# Patient Record
Sex: Female | Born: 1955
Health system: Southern US, Community
[De-identification: ages and names within clinical notes are randomized; demographics above are authoritative.]

## PROBLEM LIST (undated history)

## (undated) DIAGNOSIS — M797 Fibromyalgia: Secondary | ICD-10-CM

## (undated) DIAGNOSIS — R519 Headache, unspecified: Secondary | ICD-10-CM

## (undated) DIAGNOSIS — I2699 Other pulmonary embolism without acute cor pulmonale: Secondary | ICD-10-CM

## (undated) DIAGNOSIS — R198 Other specified symptoms and signs involving the digestive system and abdomen: Secondary | ICD-10-CM

## (undated) DIAGNOSIS — Z9889 Other specified postprocedural states: Secondary | ICD-10-CM

## (undated) DIAGNOSIS — D649 Anemia, unspecified: Secondary | ICD-10-CM

## (undated) DIAGNOSIS — T4145XA Adverse effect of unspecified anesthetic, initial encounter: Secondary | ICD-10-CM

## (undated) DIAGNOSIS — M1990 Unspecified osteoarthritis, unspecified site: Secondary | ICD-10-CM

## (undated) DIAGNOSIS — E7800 Pure hypercholesterolemia, unspecified: Secondary | ICD-10-CM

## (undated) DIAGNOSIS — R51 Headache: Secondary | ICD-10-CM

## (undated) DIAGNOSIS — Z86711 Personal history of pulmonary embolism: Secondary | ICD-10-CM

## (undated) DIAGNOSIS — R112 Nausea with vomiting, unspecified: Secondary | ICD-10-CM

## (undated) DIAGNOSIS — G4730 Sleep apnea, unspecified: Secondary | ICD-10-CM

## (undated) DIAGNOSIS — Z8719 Personal history of other diseases of the digestive system: Secondary | ICD-10-CM

## (undated) DIAGNOSIS — Z8489 Family history of other specified conditions: Secondary | ICD-10-CM

## (undated) DIAGNOSIS — M2560 Stiffness of unspecified joint, not elsewhere classified: Secondary | ICD-10-CM

## (undated) DIAGNOSIS — M94261 Chondromalacia, right knee: Secondary | ICD-10-CM

## (undated) DIAGNOSIS — K219 Gastro-esophageal reflux disease without esophagitis: Secondary | ICD-10-CM

## (undated) DIAGNOSIS — E785 Hyperlipidemia, unspecified: Secondary | ICD-10-CM

## (undated) DIAGNOSIS — R1310 Dysphagia, unspecified: Secondary | ICD-10-CM

## (undated) DIAGNOSIS — Z98811 Dental restoration status: Secondary | ICD-10-CM

## (undated) DIAGNOSIS — T8859XA Other complications of anesthesia, initial encounter: Secondary | ICD-10-CM

## (undated) DIAGNOSIS — E039 Hypothyroidism, unspecified: Secondary | ICD-10-CM

## (undated) DIAGNOSIS — G4733 Obstructive sleep apnea (adult) (pediatric): Secondary | ICD-10-CM

## (undated) DIAGNOSIS — S83249A Other tear of medial meniscus, current injury, unspecified knee, initial encounter: Secondary | ICD-10-CM

## (undated) DIAGNOSIS — Z7901 Long term (current) use of anticoagulants: Secondary | ICD-10-CM

## (undated) HISTORY — PX: TONSILLECTOMY: SUR1361

## (undated) HISTORY — PX: CARPAL TUNNEL RELEASE: SHX101

## (undated) HISTORY — DX: Fibromyalgia: M79.7

## (undated) HISTORY — DX: Personal history of pulmonary embolism: Z86.711

## (undated) HISTORY — PX: TUBAL LIGATION: SHX77

## (undated) HISTORY — PX: EYE MUSCLE SURGERY: SHX370

## (undated) HISTORY — PX: WISDOM TOOTH EXTRACTION: SHX21

## (undated) HISTORY — DX: Hypothyroidism, unspecified: E03.9

---

## 2001-04-27 ENCOUNTER — Ambulatory Visit (HOSPITAL_COMMUNITY): Admission: RE | Admit: 2001-04-27 | Discharge: 2001-04-27 | Payer: Self-pay | Admitting: Family Medicine

## 2001-04-27 ENCOUNTER — Encounter: Payer: Self-pay | Admitting: Family Medicine

## 2001-06-11 ENCOUNTER — Ambulatory Visit (HOSPITAL_BASED_OUTPATIENT_CLINIC_OR_DEPARTMENT_OTHER): Admission: RE | Admit: 2001-06-11 | Discharge: 2001-06-11 | Payer: Self-pay | Admitting: Specialist

## 2001-06-11 HISTORY — PX: SHOULDER ARTHROSCOPY: SHX128

## 2001-07-02 ENCOUNTER — Encounter (HOSPITAL_COMMUNITY): Admission: RE | Admit: 2001-07-02 | Discharge: 2001-08-01 | Payer: Self-pay | Admitting: Specialist

## 2001-08-06 ENCOUNTER — Encounter (HOSPITAL_COMMUNITY): Admission: RE | Admit: 2001-08-06 | Discharge: 2001-09-05 | Payer: Self-pay | Admitting: Specialist

## 2002-06-02 ENCOUNTER — Emergency Department (HOSPITAL_COMMUNITY): Admission: EM | Admit: 2002-06-02 | Discharge: 2002-06-02 | Payer: Self-pay | Admitting: Internal Medicine

## 2002-06-02 ENCOUNTER — Encounter: Payer: Self-pay | Admitting: Internal Medicine

## 2002-06-05 ENCOUNTER — Ambulatory Visit (HOSPITAL_COMMUNITY): Admission: RE | Admit: 2002-06-05 | Discharge: 2002-06-05 | Payer: Self-pay | Admitting: Family Medicine

## 2002-06-05 ENCOUNTER — Encounter: Payer: Self-pay | Admitting: Family Medicine

## 2002-07-18 ENCOUNTER — Observation Stay (HOSPITAL_COMMUNITY): Admission: RE | Admit: 2002-07-18 | Discharge: 2002-07-19 | Payer: Self-pay | Admitting: Neurosurgery

## 2002-07-18 ENCOUNTER — Encounter: Payer: Self-pay | Admitting: Neurosurgery

## 2002-07-18 HISTORY — PX: ANTERIOR CERVICAL DECOMP/DISCECTOMY FUSION: SHX1161

## 2002-08-01 HISTORY — PX: CARPAL TUNNEL RELEASE: SHX101

## 2003-08-15 ENCOUNTER — Ambulatory Visit (HOSPITAL_COMMUNITY): Admission: RE | Admit: 2003-08-15 | Discharge: 2003-08-15 | Payer: Self-pay | Admitting: Neurosurgery

## 2003-10-01 ENCOUNTER — Encounter (HOSPITAL_COMMUNITY): Admission: RE | Admit: 2003-10-01 | Discharge: 2003-10-02 | Payer: Self-pay | Admitting: Endocrinology

## 2004-11-02 ENCOUNTER — Ambulatory Visit (HOSPITAL_COMMUNITY): Admission: RE | Admit: 2004-11-02 | Discharge: 2004-11-02 | Payer: Self-pay | Admitting: Family Medicine

## 2004-11-17 ENCOUNTER — Encounter: Admission: RE | Admit: 2004-11-17 | Discharge: 2004-11-17 | Payer: Self-pay | Admitting: Family Medicine

## 2005-07-14 ENCOUNTER — Other Ambulatory Visit: Admission: RE | Admit: 2005-07-14 | Discharge: 2005-07-14 | Payer: Self-pay | Admitting: Dermatology

## 2005-11-03 ENCOUNTER — Encounter: Admission: RE | Admit: 2005-11-03 | Discharge: 2005-11-03 | Payer: Self-pay | Admitting: Specialist

## 2006-05-12 ENCOUNTER — Encounter: Admission: RE | Admit: 2006-05-12 | Discharge: 2006-05-12 | Payer: Self-pay | Admitting: Family Medicine

## 2007-05-15 ENCOUNTER — Encounter: Admission: RE | Admit: 2007-05-15 | Discharge: 2007-05-15 | Payer: Self-pay | Admitting: Family Medicine

## 2008-10-24 ENCOUNTER — Ambulatory Visit (HOSPITAL_COMMUNITY): Admission: RE | Admit: 2008-10-24 | Discharge: 2008-10-24 | Payer: Self-pay | Admitting: Family Medicine

## 2009-10-26 ENCOUNTER — Ambulatory Visit (HOSPITAL_COMMUNITY): Admission: RE | Admit: 2009-10-26 | Discharge: 2009-10-26 | Payer: Self-pay | Admitting: Family Medicine

## 2010-08-21 ENCOUNTER — Encounter: Payer: Self-pay | Admitting: Neurosurgery

## 2010-12-17 NOTE — Op Note (Signed)
[place]  Patient:    Wendy Marshall, Wendy Marshall Visit Number: [id number] MRN: [id number]          Service Type: DSU Location: [facility] Attending Physician:  Pierce Crane Dictated by:   Javier Docker, M.D. Proc. Date: 06/11/01 Admit Date:  06/11/2001                             Operative Report  PREOPERATIVE DIAGNOSIS:  Impingement syndrome, left shoulder.  POSTOPERATIVE DIAGNOSES: 1. Impingement syndrome, left shoulder. 2. Partial tear, rotator cuff, bursal side. 3. Labral tear. 4. Glenohumeral arthrosis.  PROCEDURES: 1. Left shoulder arthroscopy. 2. Debridement of labral tear. 3. Subacromial decompression. 4. Acromioplasty. 5. Bursectomy. 6. Debridement of minor bursal side of rotator cuff tear.  BRIEF HISTORY AND INDICATION:  This is a 55 year old female with refractory shoulder pain and MRI indicating no tear but tendinosis.  Had a positive impingement test, had a positive impingement sign, nontender over the [facility].  Due to persistent symptoms, operative intervention was indicated for decompression of the subacromial joint by acromioplasty, bursectomy, review of the rotator cuff; if torn significantly, open rotator cuff was so indicated.  Risks and benefits were discussed, including bleeding infection, damage to neurovascular structures, need for repeat procedure, tear in the future, adhesive capsulitis, etc.  DESCRIPTION OF PROCEDURE:  Patient supine in beach chair position after the induction of adequate general anesthesia, 400 mg IV Cipro, left shoulder and upper extremity was prepped and draped in the usual sterile fashion.  A surgical marker was used to delineate the acromion and AC joint and the coracoid.  Standard anterior lateral and posterior lateral portals were utilized with incision through the skin only.  Traction applied to the arm and a cannula was introduced to the glenohumeral joint, fenestrating  it atraumatically.  Inspection of the glenohumeral joint revealed some degenerative changes of the glenoid side.  We did not see any rotator cuff tear from beneath this.  There was an anterior labral tear.  I therefore introduced the cannula in the anterior portal, bisecting the coracoid and the anterolateral aspect of the acromion through the skin only beneath the biceps tendon.  I probed this.  It was not completely detached, partially. Therefore, I entered a shaver and debrided the anterior portion of the labrum. The remainder was stable to probe palpation.  There was no instability noted intraoperatively with manipulation.  The biceps tendon was unremarkable, as was the subscap.  The glenoid was unremarkable as well.  This was then copiously irrigated and the cannula was reintroduced in the subacromial space using the anterior lateral portal.  Significant hypertrophic bursa was noted, and a full bursectomy was performed utilizing the combination of the shaver and the Arthrocare wand.  Periosteum was removed from the undersurface of the acromion, and there was an anterior inferior acromial osteophyte in deep.  The anterolateral corner and the anteromedial corner were delineated with a needle.  After full bursectomy was performed, inspection of the rotator cuff revealed a minor bursal side tearing, which was minorly debrided.  This was good bleeding tissue, however, and the most significant finding here was the subacromial bursa, which was hypertrophic, and the small anterior acromial spur.  The high-speed bur was utilized to perform an acromioplasty to the anterior aspect of the acromion.  Significant room was noted after this.  The CA ligament was detached and removed as well, utilizing electrocautery.  All bleeding was  electrocauterized.  The subacromial space was copiously irrigated with a good range of motion, and there was no impingement.  Again full inspection of the rotator cuff  demonstrated no evidence of full-thickness tear, and therefore no open repair was necessary.  After repair, the portals were closed with 4-0 nylon simple sutures.  Marcaine 0.25% with epinephrine was infiltrated just prior to the procedure.  The wound was dressed sterilely. The patient was placed in a sling, extubated without difficulty, and transported to the recovery room in satisfactory condition.  The patient tolerated the procedure well. Dictated by:   Javier Docker, M.D. Attending Physician:  Pierce Crane DD:  06/11/01 TD:  06/12/01 Job: [id number]

## 2010-12-17 NOTE — H&P (Signed)
[facility]  Patient:    Wendy Marshall, Wendy Marshall Visit Number: [id number] MRN: [id number]          Service Type: Attending:  Javier Docker, M.D. Dictated by:   Ralene Bathe, P.A.                           History and Physical  DATE OF BIRTH:  [birth year]  CHIEF COMPLAINT:  Left shoulder pain.  HISTORY OF PRESENT ILLNESS:  This is a 55 year old female who has had ongoing left shoulder pain for approximately one year.  She has failed conservative measures and had an MRI which has shown significant impingement.  She has had injections which helped only a minimal amount of times.  She has demonstrated weakness as well as positive secondary impingement signs.  Patient is noted to have a type 2 acromion without significant evidence of rotator cuff tear.  At this time, it is felt best that she be treated with surgical decompression with a left shoulder arthroscopy, subacromial decompression and possible rotator cuff repair if indeed there is one that has been underestimated by MRI.  Risks and benefits were discussed with patient and she is in agreement and wishes to proceed at this point.  PAST MEDICAL HISTORY: 1. Hypothyroidism. 2. Difficulty with anesthesia on prior two occasions. 3. Reflux, treated with over-the-counter methods.  ALLERGIES: 1. PENICILLIN -- hives and shortness of breath. 2. FLEXERIL and SOMA cause decreased blood pressure and heart rate.  PAST SURGICAL HISTORY: 1. Eye surgery. 2. BTL. 3. Tonsillectomy and adenoidectomy.  MEDICATIONS:  Synthroid 0.05 mg q.d.  SOCIAL HISTORY:  She is married.  She is left-hand dominant.  She has stopped smoking now for three years and does not drink.  REVIEW OF SYSTEMS:  GENERAL:  Patient denies any recent fevers, chills or night sweats.  No bleeding tendencies.  Patient has had difficulty with anesthesia on two occasions.  CNS:  No blurred or double vision, seizures, headaches or paralysis.   RESPIRATORY:  No shortness of breath, productive cough or hemoptysis.  CARDIOVASCULAR:  No chest pain, angina or orthopnea. GI:  No nausea, vomiting, constipation, melena or bloody stools. GENITOURINARY:  No dysuria or hematuria.  MUSCULOSKELETAL:  As pertinent in present illness.  PHYSICAL EXAMINATION:  GENERAL:  Well-developed, well-nourished 55 year old female.  She is alert and oriented.  VITAL SIGNS:  Pulse 72.  Respirations are 12.  Blood pressure is 122/84.  NECK:  Supple.  No lymphadenopathy.  No carotid bruits appreciated on exam.  CHEST:  Clear to auscultation bilaterally.  No rales or rhonchi.  HEART:  Regular rate and rhythm.  No murmurs, gallops, rubs, heaves or thrills.  ABDOMEN:  Positive bowel sounds.  Soft, nontender.  EXTREMITIES:  Neurovascularly, she is intact to bilateral upper and lower extremities.  No pitting edema.  Left upper extremity shoulder exam is as in history of present illness.  IMPRESSION: 1. Left shoulder impingement syndrome. 2. Hypothyroidism.  PLAN:  Patient is being admitted for left shoulder arthroscopy, subacromial decompression and possible rotator cuff repair if indicated. Dictated by:   Ralene Bathe, P.A. Attending:  Javier Docker, M.D. DD:  05/31/01 TD:  06/01/01 Job: [id number]

## 2010-12-17 NOTE — Op Note (Signed)
NAME:  Wendy Marshall, Wendy Marshall                       ACCOUNT NO.:  [id number]   MEDICAL RECORD NO.:  [id number]                   PATIENT TYPE:  INP   LOCATION:  3007                                 FACILITY:  [facility]   PHYSICIAN:  Danae Orleans. Venetia Maxon, M.D.               DATE OF BIRTH:  1956-01-22   DATE OF PROCEDURE:  07/18/2002  DATE OF DISCHARGE:  07/19/2002                                 OPERATIVE REPORT   PREOPERATIVE DIAGNOSES:  Herniated cervical disk with cervical spondylosis,  cervical degenerative disk disease, and cervical radiculopathy at  fifth/sixth cervical.   POSTOPERATIVE DIAGNOSES:  Herniated cervical disk with cervical spondylosis,  cervical degenerative disk disease, and cervical radiculopathy at  fifth/sixth cervical.   PROCEDURE:  Anterior cervical diskectomy and decompression and fusion, C5-6  level, with allograft bone graft and anterior cervical plate.   SURGEON:  Danae Orleans. Venetia Maxon, M.D.   ASSISTANT:  Clydene Fake, M.D.   ANESTHESIA:  General endotracheal anesthesia.   ESTIMATED BLOOD LOSS:  Minimal.   COMPLICATIONS:  None.   DISPOSITION:  To recovery.   INDICATIONS:  Wendy Marshall is a 55 year old woman with cervical  spondylosis and a cervical disk herniation at the C5-6 level with  biforaminal stenosis and bilateral upper extremity numbness and weakness.  It was elected to take her to surgery for anterior cervical diskectomy and  fusion.   PROCEDURE:  Ms. Cordone was brought to the operating room.  Following  satisfactory and uncomplicated induction of general endotracheal anesthesia  and placement of intravenous lines, the patient was placed in the supine  position on the operating table.  Her neck was placed in slight extension.  She was placed in 10 pounds of pulled traction.  Her anterior neck was then  prepped and draped in the usual sterile fashion.  The planned incision was  infiltrated with 0.25% Marcaine and 0.5% lidocaine with  1:200,000  epinephrine.  The incision was made at the midline to the anterior border of  the sternocleidomastoid muscle on the left side of midline.  This was  carried sharply through the platysmal layer.  Subplatysmal dissection was  performed, and then blunt dissection was performed exposing the anterior  cervical spine, keeping the carotid sheath lateral, trachea and esophagus  medial.  The interspace which was felt to be the C5-6 level was identified,  and a spinal needle was placed at this level.  Intraoperative x-ray  confirmed this to be the C5-6 level.  Subsequently, the longus colli muscles  were taken down from the anterior cervical spine, C5 through C6 levels  bilaterally using electrocautery and a Key elevator.  A Shadowline self-  retaining retractor was placed to facilitate exposure.  The interspace of C5-  6 was then incised with a 15 blade, and disk material was removed in  piecemeal fashion using a variety of Carlens curettes.  The endplates were  stripped of residual disk material.  Disk space spreader was placed.  The  microscope was brought into the field.  Using high magnification, the  endplates of C5 and C6 were decorticated with a high-speed drill and  uncinate spurs were drilled down.  Subsequently, the central spinal cord  dura was decompressed, removing central-to-left-sided disk herniation and  then decompressing both neural foramina.  The C6 nerve roots were well  decompressed bilaterally.  A nerve hook was used along the course of the C6  nerve roots as they exited the neural foramina.  Hemostasis was assured with  Gelfoam soaked in thrombin.  An 8-mm pre-shaped patellar bone allograft  wedge was then inserted in the interspace and countersunk appropriately.  The traction weight was removed.  A 24-mm Trinica anterior cervical plate  was then affixed to the anterior cervical spine using 14-mm variable-angle  screws, two at C5, two at C6 level.  All screws had  excellent purchase.  Locking mechanisms were engaged.  Final x-ray confirmed position of the bone  graft and the anterior cervical plate.  The wound was then copiously  irrigated with bacitracin and saline.  Subcutaneous tissues were inspected  and found to be in good repair.  Hemostasis was assured with bipolar cautery  and Gelfoam soaked in thrombin.  The wound was then closed with interrupted  3-0 Vicryl at the platysmal layer and a 4-0 running Vicryl subcuticular  stitch.  The wound was dressed with Dermabond.  The patient was extubated in  the operating room and taken to the recovery room in stable satisfactory  condition, having tolerated the operation well.  All counts were correct at  the end of the case.                                               Danae Orleans. Venetia Maxon, M.D.    JDS/MEDQ  D:  07/18/2002  T:  07/19/2002  Job:  [id number]

## 2011-01-17 ENCOUNTER — Other Ambulatory Visit: Payer: Self-pay | Admitting: Family Medicine

## 2011-01-17 DIAGNOSIS — Z139 Encounter for screening, unspecified: Secondary | ICD-10-CM

## 2011-01-24 ENCOUNTER — Ambulatory Visit (HOSPITAL_COMMUNITY)
Admission: RE | Admit: 2011-01-24 | Discharge: 2011-01-24 | Disposition: A | Discharge status: Home / Self Care | Payer: 59 | Source: Ambulatory Visit | Attending: Family Medicine | Admitting: Family Medicine

## 2011-01-24 DIAGNOSIS — Z1231 Encounter for screening mammogram for malignant neoplasm of breast: Secondary | ICD-10-CM | POA: Insufficient documentation

## 2011-01-24 DIAGNOSIS — Z139 Encounter for screening, unspecified: Secondary | ICD-10-CM

## 2012-05-28 ENCOUNTER — Other Ambulatory Visit: Payer: Self-pay | Admitting: Family Medicine

## 2012-05-28 DIAGNOSIS — Z139 Encounter for screening, unspecified: Secondary | ICD-10-CM

## 2012-06-04 ENCOUNTER — Ambulatory Visit (HOSPITAL_COMMUNITY)
Admission: RE | Admit: 2012-06-04 | Discharge: 2012-06-04 | Disposition: A | Discharge status: Home / Self Care | Payer: BC Managed Care – PPO | Source: Ambulatory Visit | Attending: Family Medicine | Admitting: Family Medicine

## 2012-06-04 DIAGNOSIS — Z1231 Encounter for screening mammogram for malignant neoplasm of breast: Secondary | ICD-10-CM | POA: Insufficient documentation

## 2012-06-04 DIAGNOSIS — Z139 Encounter for screening, unspecified: Secondary | ICD-10-CM

## 2013-08-02 ENCOUNTER — Ambulatory Visit (INDEPENDENT_AMBULATORY_CARE_PROVIDER_SITE_OTHER): Payer: 59 | Admitting: Nurse Practitioner

## 2013-08-02 ENCOUNTER — Encounter: Payer: Self-pay | Admitting: Nurse Practitioner

## 2013-08-02 VITALS — BP 134/96 | Temp 98.3°F | Ht 64.0 in | Wt 198.3 lb

## 2013-08-02 DIAGNOSIS — N949 Unspecified condition associated with female genital organs and menstrual cycle: Secondary | ICD-10-CM

## 2013-08-02 DIAGNOSIS — R102 Pelvic and perineal pain: Secondary | ICD-10-CM

## 2013-08-02 DIAGNOSIS — R109 Unspecified abdominal pain: Secondary | ICD-10-CM

## 2013-08-02 LAB — POCT URINALYSIS DIPSTICK
Spec Grav, UA: 1.02
pH, UA: 5

## 2013-08-05 ENCOUNTER — Encounter: Payer: Self-pay | Admitting: Nurse Practitioner

## 2013-08-05 DIAGNOSIS — M797 Fibromyalgia: Secondary | ICD-10-CM | POA: Insufficient documentation

## 2013-08-05 DIAGNOSIS — E039 Hypothyroidism, unspecified: Secondary | ICD-10-CM | POA: Insufficient documentation

## 2013-08-05 NOTE — Progress Notes (Signed)
Subjective:  Presents complaints of lower abdominal pain for the past week. No nausea vomiting. No fever. No upper abdominal pain. Postmenopausal. No vaginal bleeding for the past 2-3 years. Pain is towards the left lower abdominal area. Describes as a burning/stinging sensation. Pain is fairly constant worse with sitting bending or laying on her left side. No urinary symptoms. No diarrhea constipation or blood in her stool. No vaginal discharge. Married, same sexual partner.  Objective:   BP 134/96  Temp(Src) 98.3 F (36.8 C)  Ht 5\' 4"  (1.626 m)  Wt 198 lb 4.8 oz (89.948 kg)  BMI 34.02 kg/m2 NAD. Alert, oriented. Lungs clear. No CVA area tenderness. Heart regular rate rhythm. Abdomen soft nondistended with left pelvic/left lower Carter and tenderness on exam. External GU normal. Vagina moist, no discharge noted. Cervix normal limit in appearance. No CMT. Right adnexal area and midline nontender. Tenderness towards the left adnexal area, no obvious masses. Tenderness appears to improve slightly towards the left lower abdominal area. No rebound or guarding. UA negative. Urine pregnancy test negative.  Assessment:Abdominal pain, unspecified site - Plan: POCT urinalysis dipstick, POCT Pregnancy, Urine, POCT Pregnancy, Urine  Pelvic pain in female - Plan: US Pelvis Complete  Plan: Warning signs reviewed at length including worsening pain and fever. Patient to go ED over the weekend if her symptoms worsen. Otherwise pelvic ultrasound as planned. Further followup based on ultrasound report.

## 2013-08-06 ENCOUNTER — Ambulatory Visit (HOSPITAL_COMMUNITY): Payer: BC Managed Care – PPO

## 2013-10-23 ENCOUNTER — Other Ambulatory Visit: Payer: Self-pay | Admitting: Nurse Practitioner

## 2013-12-17 ENCOUNTER — Ambulatory Visit (INDEPENDENT_AMBULATORY_CARE_PROVIDER_SITE_OTHER): Payer: 59 | Admitting: Family Medicine

## 2013-12-17 ENCOUNTER — Encounter: Payer: Self-pay | Admitting: Family Medicine

## 2013-12-17 VITALS — BP 128/76 | Temp 98.4°F | Ht 64.0 in | Wt 201.0 lb

## 2013-12-17 DIAGNOSIS — Z131 Encounter for screening for diabetes mellitus: Secondary | ICD-10-CM

## 2013-12-17 DIAGNOSIS — E039 Hypothyroidism, unspecified: Secondary | ICD-10-CM

## 2013-12-17 DIAGNOSIS — Z13220 Encounter for screening for lipoid disorders: Secondary | ICD-10-CM

## 2013-12-17 DIAGNOSIS — J329 Chronic sinusitis, unspecified: Secondary | ICD-10-CM

## 2013-12-17 MED ORDER — AZITHROMYCIN 250 MG PO TABS: ORAL_TABLET | ORAL | Status: DC

## 2013-12-17 NOTE — Progress Notes (Addendum)
   Subjective:    Patient ID: Wendy Marshall, female    DOB: 23-Apr-1956, 58 y.o.   MRN: [id number]  Sore Throat  This is a new problem. The current episode started today. Neither side of throat is experiencing more pain than the other. There has been no fever. Associated symptoms include ear pain. She has tried NSAIDs for the symptoms. The treatment provided mild relief.   Results for orders placed in visit on 08/02/13  POCT URINALYSIS DIPSTICK      Result Value Ref Range   Color, UA       Clarity, UA       Glucose, UA       Bilirubin, UA       Ketones, UA       Spec Grav, UA 1.020     Blood, UA       pH, UA 5.0     Protein, UA       Urobilinogen, UA       Nitrite, UA       Leukocytes, UA        Pos strep exposure  No fever  99 this morn  bilat ear pain  Energy level doable,  Review of Systems  HENT: Positive for ear pain.    Diarrhea no rash ROS otherwise negative    Objective:   Physical Exam  Alert moderate malaise. H&T frontal mass or tenderness. Pharynx erythematous neck supple. Lungs clear heart regular in rhythm.      Assessment & Plan:  Impression 1 rhinosinusitis #2 hypothyroidism status uncertain. Plan antibiotics prescribed. Symptomatic care discussed. Fasting blood work ordered. WSL

## 2013-12-18 ENCOUNTER — Encounter: Payer: Self-pay | Admitting: Family Medicine

## 2013-12-28 LAB — LIPID PANEL
Cholesterol: 215 mg/dL — ABNORMAL HIGH (ref 0–200)
HDL: 50 mg/dL (ref >39)
LDL Cholesterol: 138 mg/dL — ABNORMAL HIGH (ref 0–99)
Total CHOL/HDL Ratio: 4.3 Ratio
Triglycerides: 135 mg/dL (ref <150)
VLDL: 27 mg/dL (ref 0–40)

## 2013-12-28 LAB — TSH: TSH: 1.969 u[IU]/mL (ref 0.350–4.500)

## 2013-12-28 LAB — GLUCOSE, RANDOM: Glucose, Bld: 86 mg/dL (ref 70–99)

## 2013-12-29 ENCOUNTER — Encounter: Payer: Self-pay | Admitting: Family Medicine

## 2014-01-09 ENCOUNTER — Other Ambulatory Visit: Payer: Self-pay | Admitting: Family Medicine

## 2014-07-02 ENCOUNTER — Other Ambulatory Visit: Payer: Self-pay | Admitting: Family Medicine

## 2014-10-07 ENCOUNTER — Encounter: Payer: Self-pay | Admitting: Family Medicine

## 2014-11-24 ENCOUNTER — Other Ambulatory Visit: Payer: Self-pay | Admitting: Family Medicine

## 2015-02-17 ENCOUNTER — Telehealth: Payer: Self-pay | Admitting: Family Medicine

## 2015-02-17 DIAGNOSIS — Z79899 Other long term (current) drug therapy: Secondary | ICD-10-CM

## 2015-02-17 DIAGNOSIS — E039 Hypothyroidism, unspecified: Secondary | ICD-10-CM

## 2015-02-17 DIAGNOSIS — Z13220 Encounter for screening for lipoid disorders: Secondary | ICD-10-CM

## 2015-02-17 NOTE — Telephone Encounter (Signed)
Calling to request order for BW.

## 2015-02-17 NOTE — Telephone Encounter (Signed)
Lipid, metabolic 7, TSH-it would be wise for patient to also schedule follow-up visit at this time in order to see [place] after these labs

## 2015-02-18 NOTE — Telephone Encounter (Signed)
Blood work orders placed in [employer]. Patient notified.

## 2015-02-24 LAB — LIPID PANEL
Chol/HDL Ratio: 3.9 ratio units (ref 0.0–4.4)
Cholesterol, Total: 232 mg/dL — ABNORMAL HIGH (ref 100–199)
HDL: 60 mg/dL (ref >39)
LDL Calculated: 145 mg/dL — ABNORMAL HIGH (ref 0–99)
Triglycerides: 137 mg/dL (ref 0–149)
VLDL Cholesterol Cal: 27 mg/dL (ref 5–40)

## 2015-02-24 LAB — BASIC METABOLIC PANEL
BUN/Creatinine Ratio: 13 (ref 9–23)
BUN: 11 mg/dL (ref 6–24)
CO2: 24 mmol/L (ref 18–29)
Calcium: 9.2 mg/dL (ref 8.7–10.2)
Chloride: 101 mmol/L (ref 97–108)
Creatinine, Ser: 0.83 mg/dL (ref 0.57–1.00)
GFR calc Af Amer: 91 mL/min/{1.73_m2} (ref >59)
GFR calc non Af Amer: 79 mL/min/{1.73_m2} (ref >59)
Glucose: 90 mg/dL (ref 65–99)
Potassium: 4.4 mmol/L (ref 3.5–5.2)
Sodium: 140 mmol/L (ref 134–144)

## 2015-02-24 LAB — TSH: TSH: 1.03 u[IU]/mL (ref 0.450–4.500)

## 2015-03-02 ENCOUNTER — Encounter: Payer: Self-pay | Admitting: Nurse Practitioner

## 2015-03-02 ENCOUNTER — Ambulatory Visit (INDEPENDENT_AMBULATORY_CARE_PROVIDER_SITE_OTHER): Payer: BLUE CROSS/BLUE SHIELD | Admitting: Nurse Practitioner

## 2015-03-02 VITALS — BP 106/76 | Ht 64.0 in | Wt 200.5 lb

## 2015-03-02 DIAGNOSIS — E039 Hypothyroidism, unspecified: Secondary | ICD-10-CM | POA: Diagnosis not present

## 2015-03-02 MED ORDER — LEVOTHYROXINE SODIUM 100 MCG PO TABS: 100.0000 ug | ORAL_TABLET | Freq: Every day | ORAL | Status: DC

## 2015-03-03 ENCOUNTER — Encounter: Payer: Self-pay | Admitting: Nurse Practitioner

## 2015-03-03 NOTE — Progress Notes (Signed)
Subjective:  Presents for recheck on her thyroid. Did not have any insurance due to loss of her job for several months. Now back to work and has insurance. Her mother had some leftover 100 g levothyroxine after she passed away, patient has been taking that for 3 months. Energy level much improved. Not as cold natured. States her eyebrows have grown back. No tremors or palpitations. No insomnia. Continues to eat healthy diet with regular activity. Still having difficulty losing weight. No difficulty swallowing.  Objective:   BP 106/76 mmHg  Ht 5\' 4"  (1.626 m)  Wt 200 lb 8 oz (90.946 kg)  BMI 34.40 kg/m2 NAD. Alert, oriented. Lungs clear. Heart regular rate rhythm. No tachycardia. No tremors. No lid lag. Thyroid nontender, no masses or goiter noted. TSH on 7/25 was 1.03 while patient was on for 100 g dose.  Assessment:  Problem List Items Addressed This Visit      Endocrine   Hypothyroidism - Primary (Chronic)   Relevant Medications   levothyroxine (SYNTHROID, LEVOTHROID) 100 MCG tablet     Plan:  Meds ordered this encounter  Medications  . levothyroxine (SYNTHROID, LEVOTHROID) 100 MCG tablet    Sig: Take 1 tablet (100 mcg total) by mouth daily.    Dispense:  90 tablet    Refill:  3    Order Specific Question:  Supervising Provider    Answer:  Mikey Kirschner [[id number]]   Continue 100 g. Discussed importance of regular activity and weight loss. Reminded patient about preventive health physical. Return in about 1 year (around 03/01/2016).

## 2015-04-30 ENCOUNTER — Emergency Department (HOSPITAL_COMMUNITY)
Admission: EM | Admit: 2015-04-30 | Discharge: 2015-05-01 | Disposition: A | Discharge status: Home / Self Care | Payer: BLUE CROSS/BLUE SHIELD | Attending: Emergency Medicine | Admitting: Emergency Medicine

## 2015-04-30 ENCOUNTER — Encounter (HOSPITAL_COMMUNITY): Payer: Self-pay | Admitting: *Deleted

## 2015-04-30 ENCOUNTER — Emergency Department (HOSPITAL_COMMUNITY): Payer: BLUE CROSS/BLUE SHIELD

## 2015-04-30 DIAGNOSIS — K047 Periapical abscess without sinus: Secondary | ICD-10-CM | POA: Insufficient documentation

## 2015-04-30 DIAGNOSIS — E039 Hypothyroidism, unspecified: Secondary | ICD-10-CM | POA: Insufficient documentation

## 2015-04-30 DIAGNOSIS — M797 Fibromyalgia: Secondary | ICD-10-CM | POA: Diagnosis not present

## 2015-04-30 DIAGNOSIS — Z79899 Other long term (current) drug therapy: Secondary | ICD-10-CM | POA: Diagnosis not present

## 2015-04-30 DIAGNOSIS — R51 Headache: Secondary | ICD-10-CM | POA: Insufficient documentation

## 2015-04-30 DIAGNOSIS — Z880 Allergy status to penicillin: Secondary | ICD-10-CM | POA: Insufficient documentation

## 2015-04-30 DIAGNOSIS — K920 Hematemesis: Secondary | ICD-10-CM | POA: Diagnosis not present

## 2015-04-30 DIAGNOSIS — R509 Fever, unspecified: Secondary | ICD-10-CM | POA: Insufficient documentation

## 2015-04-30 DIAGNOSIS — Z792 Long term (current) use of antibiotics: Secondary | ICD-10-CM | POA: Diagnosis not present

## 2015-04-30 DIAGNOSIS — R519 Headache, unspecified: Secondary | ICD-10-CM

## 2015-04-30 LAB — CBC WITH DIFFERENTIAL/PLATELET
Basophils Absolute: 0 10*3/uL (ref 0.0–0.1)
Basophils Relative: 0 %
Eosinophils Absolute: 0 10*3/uL (ref 0.0–0.7)
Eosinophils Relative: 0 %
HCT: 43.3 % (ref 36.0–46.0)
Hemoglobin: 14.4 g/dL (ref 12.0–15.0)
Lymphocytes Relative: 22 %
Lymphs Abs: 1.8 10*3/uL (ref 0.7–4.0)
MCH: 27.1 pg (ref 26.0–34.0)
MCHC: 33.3 g/dL (ref 30.0–36.0)
MCV: 81.4 fL (ref 78.0–100.0)
Monocytes Absolute: 0.5 10*3/uL (ref 0.1–1.0)
Monocytes Relative: 6 %
Neutro Abs: 6 10*3/uL (ref 1.7–7.7)
Neutrophils Relative %: 72 %
Platelets: 243 10*3/uL (ref 150–400)
RBC: 5.32 MIL/uL — ABNORMAL HIGH (ref 3.87–5.11)
RDW: 13 % (ref 11.5–15.5)
WBC: 8.4 10*3/uL (ref 4.0–10.5)

## 2015-04-30 LAB — BASIC METABOLIC PANEL
Anion gap: 5 (ref 5–15)
BUN: 11 mg/dL (ref 6–20)
CO2: 27 mmol/L (ref 22–32)
Calcium: 9.1 mg/dL (ref 8.9–10.3)
Chloride: 102 mmol/L (ref 101–111)
Creatinine, Ser: 0.74 mg/dL (ref 0.44–1.00)
GFR calc Af Amer: >60 mL/min (ref >60)
GFR calc non Af Amer: >60 mL/min (ref >60)
Glucose, Bld: 112 mg/dL — ABNORMAL HIGH (ref 65–99)
Potassium: 3.7 mmol/L (ref 3.5–5.1)
Sodium: 134 mmol/L — ABNORMAL LOW (ref 135–145)

## 2015-04-30 MED ORDER — IOHEXOL 300 MG/ML  SOLN
75.0000 mL | Freq: Once | INTRAMUSCULAR | Status: AC | PRN
  Administered 2015-04-30: 75 mL via INTRAVENOUS

## 2015-04-30 MED ORDER — DIPHENHYDRAMINE HCL 50 MG/ML IJ SOLN
25.0000 mg | Freq: Once | INTRAMUSCULAR | Status: AC
  Administered 2015-04-30: 25 mg via INTRAVENOUS
  Filled 2015-04-30: qty 1

## 2015-04-30 MED ORDER — PROCHLORPERAZINE EDISYLATE 5 MG/ML IJ SOLN
10.0000 mg | Freq: Once | INTRAMUSCULAR | Status: AC
  Administered 2015-04-30: 10 mg via INTRAVENOUS
  Filled 2015-04-30: qty 2
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
## 2015-04-30 NOTE — ED Notes (Signed)
Pt reporting having a root canal done yesterday, since that time has had headache, nausea, vomiting and neck pain.  Unable to reach dentist today.

## 2015-05-01 MED ORDER — KETOROLAC TROMETHAMINE 30 MG/ML IJ SOLN
30.0000 mg | Freq: Once | INTRAMUSCULAR | Status: AC
  Administered 2015-05-01: 30 mg via INTRAVENOUS
  Filled 2015-05-01: qty 1

## 2015-05-01 MED ORDER — MAGNESIUM SULFATE 2 GM/50ML IV SOLN
2.0000 g | Freq: Once | INTRAVENOUS | Status: AC
  Administered 2015-05-01: 2 g via INTRAVENOUS
  Filled 2015-05-01: qty 50

## 2015-05-01 MED ORDER — PROMETHAZINE HCL 25 MG PO TABS: 25.0000 mg | ORAL_TABLET | Freq: Four times a day (QID) | ORAL | Status: DC | PRN

## 2015-05-01 MED ORDER — DIPHENHYDRAMINE HCL 50 MG/ML IJ SOLN
25.0000 mg | Freq: Once | INTRAMUSCULAR | Status: AC
  Administered 2015-05-01: 25 mg via INTRAVENOUS
  Filled 2015-05-01: qty 1

## 2015-05-01 MED ORDER — PROCHLORPERAZINE EDISYLATE 5 MG/ML IJ SOLN
10.0000 mg | Freq: Once | INTRAMUSCULAR | Status: AC
  Administered 2015-05-01: 10 mg via INTRAVENOUS
  Filled 2015-05-01: qty 2

## 2015-08-06 ENCOUNTER — Ambulatory Visit (INDEPENDENT_AMBULATORY_CARE_PROVIDER_SITE_OTHER): Payer: BLUE CROSS/BLUE SHIELD | Admitting: Family Medicine

## 2015-08-06 ENCOUNTER — Encounter: Payer: Self-pay | Admitting: Family Medicine

## 2015-08-06 ENCOUNTER — Ambulatory Visit (HOSPITAL_COMMUNITY)
Admission: RE | Admit: 2015-08-06 | Discharge: 2015-08-06 | Disposition: A | Discharge status: Home / Self Care | Payer: BLUE CROSS/BLUE SHIELD | Source: Ambulatory Visit | Attending: Family Medicine | Admitting: Family Medicine

## 2015-08-06 VITALS — BP 124/88 | Temp 99.1°F | Ht 64.0 in | Wt 206.0 lb

## 2015-08-06 DIAGNOSIS — M5432 Sciatica, left side: Secondary | ICD-10-CM

## 2015-08-06 DIAGNOSIS — J019 Acute sinusitis, unspecified: Secondary | ICD-10-CM | POA: Diagnosis not present

## 2015-08-06 DIAGNOSIS — M5442 Lumbago with sciatica, left side: Secondary | ICD-10-CM | POA: Diagnosis not present

## 2015-08-06 DIAGNOSIS — B9689 Other specified bacterial agents as the cause of diseases classified elsewhere: Secondary | ICD-10-CM

## 2015-08-06 DIAGNOSIS — M545 Low back pain: Secondary | ICD-10-CM | POA: Diagnosis present

## 2015-08-06 MED ORDER — HYDROCODONE-ACETAMINOPHEN 5-325 MG PO TABS: 1.0000 | ORAL_TABLET | Freq: Four times a day (QID) | ORAL | Status: DC | PRN

## 2015-08-06 MED ORDER — LEVOFLOXACIN 500 MG PO TABS: 500.0000 mg | ORAL_TABLET | Freq: Every day | ORAL | Status: DC

## 2015-08-06 NOTE — Progress Notes (Signed)
   Subjective:    Patient ID: Wendy Marshall, female    DOB: [birth year], 60 y.o.   MRN: [id number]  Back Pain This is a recurrent problem. Episode onset: 3 months ago. The pain is present in the gluteal. The pain radiates to the left thigh. Exacerbated by: movement. Stiffness is present all day. Treatments tried: ibuprofen, rest.   Low grade fever, sinus pressure, sinus drainage, cough, sore throat, ear pain. Started 2 weeks ago. Taking mucinex.    Review of Systems  Musculoskeletal: Positive for back pain.  patient radiates down pain radiates down the left leg. Radiates from the back. Some numbness with it. Radiates down to approximately the left side of the leg. Worse with sitting. Denies wheezing or difficulty breathing relates a lot of head congestion drainage coughing sinus pressure pain     Objective:   Physical Exam Neck no masses lungs are clear moderate sinus tenderness eardrums normal positive straight leg raise on the left reflexes more brisk on the left patient unable to walk on her heels is able to walk on her toes is able to raise up her great toe on both sides equally  25 minutes was spent with the patient. Greater than half the time was spent in discussion and answering questions and counseling regarding the issues that the patient came in for today. Significant amount of time was spent discussing the issues and discussing the approaches the testing and treatment of this sciatica greater than half the time spent     Assessment & Plan:  Severe chronic low back pain over the past several years worse over the past 3 months with onset of severe sciatica for the past 3 months has managed this with Advil but now pain is causing weakness causing difficulty doing her job as well as keeping her awake at night over the past 6 weeks. Is not got better with conservative measures of stretching and anti-inflammatory over the past 12 weeks. I do believe it is indicated for this patient to  go ahead MRI the lumbar spine. May need referral to neurosurgery or orthopedic surgery depending on the results of the tests.  Patient defers on any type of gabapentin or Lyrica she states in the past that caused side effects hydrocodone prescribed for home use caution drowsiness anti-inflammatory during the day gentle stretching range of motion exercises recommended  Sinusitis antibiotics prescribed warning signs discussed follow-up if problems

## 2015-08-18 ENCOUNTER — Ambulatory Visit (HOSPITAL_COMMUNITY)
Admission: RE | Admit: 2015-08-18 | Discharge: 2015-08-18 | Disposition: A | Discharge status: Home / Self Care | Payer: BLUE CROSS/BLUE SHIELD | Source: Ambulatory Visit | Attending: Family Medicine | Admitting: Family Medicine

## 2015-08-18 DIAGNOSIS — M5432 Sciatica, left side: Secondary | ICD-10-CM | POA: Insufficient documentation

## 2015-08-18 DIAGNOSIS — M5442 Lumbago with sciatica, left side: Secondary | ICD-10-CM | POA: Diagnosis present

## 2015-08-18 DIAGNOSIS — M479 Spondylosis, unspecified: Secondary | ICD-10-CM | POA: Insufficient documentation

## 2015-08-19 ENCOUNTER — Encounter: Payer: Self-pay | Admitting: Family Medicine

## 2015-08-19 NOTE — Addendum Note (Signed)
Addended by: Ofilia Neas R on: 08/19/2015 09:08 AM   Modules accepted: Orders

## 2015-10-02 ENCOUNTER — Encounter: Payer: Self-pay | Admitting: Family Medicine

## 2015-10-02 ENCOUNTER — Ambulatory Visit (INDEPENDENT_AMBULATORY_CARE_PROVIDER_SITE_OTHER): Payer: BLUE CROSS/BLUE SHIELD | Admitting: Family Medicine

## 2015-10-02 VITALS — Temp 98.2°F | Ht 64.0 in | Wt 203.2 lb

## 2015-10-02 DIAGNOSIS — L209 Atopic dermatitis, unspecified: Secondary | ICD-10-CM | POA: Diagnosis not present

## 2015-10-02 MED ORDER — MOMETASONE FUROATE 0.1 % EX CREA: TOPICAL_CREAM | CUTANEOUS | Status: DC

## 2015-10-02 NOTE — Progress Notes (Signed)
   Subjective:    Patient ID: Wendy Marshall, female    DOB: [birth year], 60 y.o.   MRN: [id number]  HPI  mild excoriated area on the left side neck present over the past few days itches intensely tried hydrocortisone cream burn and itch during the night. Patient recently had the flu she once to be certain that she is not having any type of complications from the flu.   Review of Systems     Mild excoriated area on the left side of the neck. Objective:   Physical Exam   atopic dermatitis around the neck. Lungs clear heart regular.  I see no evidence of complications from the flu.     Assessment & Plan:   atopic dermatitis Elocon cream apply twice a day when necessary follow-up if ongoing troubles

## 2016-01-06 ENCOUNTER — Encounter: Payer: Self-pay | Admitting: Nurse Practitioner

## 2016-01-06 ENCOUNTER — Ambulatory Visit (INDEPENDENT_AMBULATORY_CARE_PROVIDER_SITE_OTHER): Payer: BLUE CROSS/BLUE SHIELD | Admitting: Nurse Practitioner

## 2016-01-06 VITALS — BP 130/80 | Ht 64.0 in | Wt 201.0 lb

## 2016-01-06 DIAGNOSIS — H109 Unspecified conjunctivitis: Secondary | ICD-10-CM

## 2016-01-06 MED ORDER — SULFACETAMIDE SODIUM 10 % OP SOLN: 1.0000 [drp] | OPHTHALMIC | Status: DC

## 2016-01-06 NOTE — Progress Notes (Addendum)
Subjective:  Presents for complaints of drainage and increased tearing from both eyes for the past 2 weeks. Minimal tenderness. Minimally pruritic. No fever. States a family member had a similar type problem. Also has her surgical clearance form, patient having hip surgery later this month. Nonsmoker. Occasional mild reflux relieved with Tums.  Objective:   BP 130/80 mmHg  Ht 5\' 4"  (1.626 m)  Wt 201 lb (91.173 kg)  BMI 34.48 kg/m2 NAD. Alert, oriented. TMs normal limit. Conjunctiva mildly injected bilaterally, no active drainage. Increased tearing left eye. No preauricular adenopathy noted. Pharynx clear. Neck supple with mild soft adenopathy. Lungs clear. Heart regular rate rhythm. Abdomen soft nontender.  Assessment: Bilateral conjunctivitis  Plan:  Meds ordered this encounter  Medications  . sulfacetamide (BLEPH-10) 10 % ophthalmic solution    Sig: Place 1 drop into both eyes every 2 (two) hours while awake. Today; Then QID starting tomorrow    Dispense:  10 mL    Refill:  0    Order Specific Question:  Supervising Provider    Answer:  Mikey Kirschner [[id number]]   Call back if conjunctivitis persist. Based on history and physical, no problems were noted at this time that would prohibit her surgery. Her last labs were done last year. No specific problems were noted.Strongly recommend preventive health physical once she recuperates from her surgery.

## 2016-01-08 NOTE — H&P (Signed)
TOTAL HIP ADMISSION H&P  Patient is admitted for left total hip arthroplasty, anterior approach.  Subjective:  Chief Complaint:   Left hip primary OA / pain  HPI: Wendy Marshall, 60 y.o. female, has a history of pain and functional disability in the left hip(s) due to arthritis and patient has failed non-surgical conservative treatments for greater than 12 weeks to include NSAID's and/or analgesics, corticosteriod injections and activity modification.  Onset of symptoms was gradual starting ~1 years ago with gradually worsening course since that time.The patient noted no past surgery on the left hip(s).  Patient currently rates pain in the left hip at 9 out of 10 with activity. Patient has night pain, worsening of pain with activity and weight bearing, trendelenberg gait, pain that interfers with activities of daily living and pain with passive range of motion. Patient has evidence of periarticular osteophytes and joint space narrowing by imaging studies. This condition presents safety issues increasing the risk of falls.  There is no current active infection.   Risks, benefits and expectations were discussed with the patient.  Risks including but not limited to the risk of anesthesia, blood clots, nerve damage, blood vessel damage, failure of the prosthesis, infection and up to and including death.  Patient understand the risks, benefits and expectations and wishes to proceed with surgery.   PCP: Wendy Lange, MD  D/C Plans:      Home with HHPT  Post-op Meds:       No Rx given  Tranexamic Acid:      To be given - IV   Decadron:      Is to be given  FYI:     ASA  Norco  Avoid muscle relaxers     Patient Active Problem List   Diagnosis Date Noted  . Left-sided low back pain with left-sided sciatica 08/06/2015  . Hypothyroidism 08/05/2013  . Fibromyalgia 08/05/2013   Past Medical History  Diagnosis Date  . Hypothyroidism   . Fibromyalgia     Past Surgical History  Procedure  Laterality Date  . Eye surgery    . Carpal tunnel release    . Mandible surgery    . Shoulder surgery    . Root canal      No prescriptions prior to admission   Allergies  Allergen Reactions  . Penicillins Hives, Shortness Of Breath and Other (See Comments)    Childhood reaction was told her reaction may have been shortness of breath and hives Has patient had a PCN reaction causing immediate rash, facial/tongue/throat swelling, SOB or lightheadedness with hypotension: yes Has patient had a PCN reaction causing severe rash involving mucus membranes or skin necrosis: no Has patient had a PCN reaction that required hospitalization no Has patient had a PCN reaction occurring within the last 10 years: no If all of the above answers are "NO", then may proceed   . Flexeril [Cyclobenzaprine] Other (See Comments)    Heart problems - was told she should probably avoid all muscle relaxers   . Soma [Carisoprodol] Other (See Comments)    Heart problems - was told she should probably avoid all muscle relaxers    Social History  Substance Use Topics  . Smoking status: Never Smoker   . Smokeless tobacco: Not on file  . Alcohol Use: No    Family History  Problem Relation Age of Onset  . Hypertension Mother   . Cancer Father     brain  . Cancer Other  colon  . Heart attack Other   . Cancer Other     throat     Review of Systems  Constitutional: Negative.   HENT: Negative.   Eyes: Negative.   Respiratory: Negative.   Cardiovascular: Negative.   Gastrointestinal: Negative.   Genitourinary: Negative.   Musculoskeletal: Positive for joint pain.  Skin: Negative.   Neurological: Negative.   Endo/Heme/Allergies: Negative.   Psychiatric/Behavioral: Negative.     Objective:  Physical Exam  Constitutional: She is oriented to person, place, and time. She appears well-developed.  HENT:  Head: Normocephalic.  Eyes: Pupils are equal, round, and reactive to light.  Neck: Neck  supple. No JVD present. No tracheal deviation present. No thyromegaly present.  Cardiovascular: Normal rate, regular rhythm, normal heart sounds and intact distal pulses.   Respiratory: Effort normal and breath sounds normal. No stridor. No respiratory distress. She has no wheezes.  GI: Soft. There is no tenderness. There is no guarding.  Musculoskeletal:       Left hip: She exhibits decreased range of motion, decreased strength, tenderness and bony tenderness. She exhibits no swelling, no deformity and no laceration.  Lymphadenopathy:    She has no cervical adenopathy.  Neurological: She is alert and oriented to person, place, and time.  Skin: Skin is warm and dry.  Psychiatric: She has a normal mood and affect.      Labs:  Estimated body mass index is 34.86 kg/(m^2) as calculated from the following:   Height as of 10/02/15: 5\' 4"  (1.626 m).   Weight as of 10/02/15: 92.171 kg (203 lb 3.2 oz).   Imaging Review Plain radiographs demonstrate severe degenerative joint disease of the left hip(s). The bone quality appears to be good for age and reported activity level.  Assessment/Plan:  End stage arthritis, left hip(s)  The patient history, physical examination, clinical judgement of the provider and imaging studies are consistent with end stage degenerative joint disease of the left hip(s) and total hip arthroplasty is deemed medically necessary. The treatment options including medical management, injection therapy, arthroscopy and arthroplasty were discussed at length. The risks and benefits of total hip arthroplasty were presented and reviewed. The risks due to aseptic loosening, infection, stiffness, dislocation/subluxation,  thromboembolic complications and other imponderables were discussed.  The patient acknowledged the explanation, agreed to proceed with the plan and consent was signed. Patient is being admitted for inpatient treatment for surgery, pain control, PT, OT, prophylactic  antibiotics, VTE prophylaxis, progressive ambulation and ADL's and discharge planning.The patient is planning to be discharged home with home health services.     Wendy Pugh Shalisa Mcquade   PA-C  01/13/2016, 4:24 PM

## 2016-01-13 ENCOUNTER — Other Ambulatory Visit (HOSPITAL_COMMUNITY): Payer: BLUE CROSS/BLUE SHIELD

## 2016-01-13 NOTE — Patient Instructions (Signed)
CORAH WURZEL  01/13/2016   Your procedure is scheduled on: 02/01/2016   Report to [facility] Main  Entrance take [place]  elevators to 3rd floor to  [place] at   0730 AM.  Call this number if you have problems the morning of surgery [phone]   Remember: ONLY 1 PERSON MAY GO WITH YOU TO SHORT STAY TO GET  READY MORNING OF [place].  Do not eat food or drink liquids :After Midnight.     Take these medicines the morning of surgery with A SIP OF WATER: Synthroid, Claritin if needed, Eye drops if needed                                 You may not have any metal on your body including hair pins and              piercings  Do not wear jewelry, make-up, lotions, powders or perfumes, deodorant             Do not wear nail polish.  Do not shave  48 hours prior to surgery.                Do not bring valuables to the hospital. [place].  Contacts, dentures or bridgework may not be worn into surgery.  Leave suitcase in the car. After surgery it may be brought to your room.         Special Instructions: coughing and deep breathing exercises, leg exercises               Please read over the following fact sheets you were given: _____________________________________________________________________             [facility] - Preparing for Surgery Before surgery, you can play an important role.  Because skin is not sterile, your skin needs to be as free of germs as possible.  You can reduce the number of germs on your skin by washing with CHG (chlorahexidine gluconate) soap before surgery.  CHG is an antiseptic cleaner which kills germs and bonds with the skin to continue killing germs even after washing. Please DO NOT use if you have an allergy to CHG or antibacterial soaps.  If your skin becomes reddened/irritated stop using the CHG and inform your nurse when you arrive at Short Stay. Do not shave  (including legs and underarms) for at least 48 hours prior to the first CHG shower.  You may shave your face/neck. Please follow these instructions carefully:  1.  Shower with CHG Soap the night before surgery and the  morning of Surgery.  2.  If you choose to wash your hair, wash your hair first as usual with your  normal  shampoo.  3.  After you shampoo, rinse your hair and body thoroughly to remove the  shampoo.                           4.  Use CHG as you would any other liquid soap.  You can apply chg directly  to the skin and wash  Gently with a scrungie or clean washcloth.  5.  Apply the CHG Soap to your body ONLY FROM THE NECK DOWN.   Do not use on face/ open                           Wound or open sores. Avoid contact with eyes, ears mouth and genitals (private parts).                       Wash face,  Genitals (private parts) with your normal soap.             6.  Wash thoroughly, paying special attention to the area where your surgery  will be performed.  7.  Thoroughly rinse your body with warm water from the neck down.  8.  DO NOT shower/wash with your normal soap after using and rinsing off  the CHG Soap.                9.  Pat yourself dry with a clean towel.            10.  Wear clean pajamas.            11.  Place clean sheets on your bed the night of your first shower and do not  sleep with pets. Day of Surgery : Do not apply any lotions/deodorants the morning of surgery.  Please wear clean clothes to the hospital/surgery center.  FAILURE TO FOLLOW THESE INSTRUCTIONS MAY RESULT IN THE CANCELLATION OF YOUR SURGERY PATIENT SIGNATURE_________________________________  NURSE SIGNATURE__________________________________  ________________________________________________________________________  WHAT IS A BLOOD TRANSFUSION? Blood Transfusion Information  A transfusion is the replacement of blood or some of its parts. Blood is made up of multiple cells which  provide different functions.  Red blood cells carry oxygen and are used for blood loss replacement.  White blood cells fight against infection.  Platelets control bleeding.  Plasma helps clot blood.  Other blood products are available for specialized needs, such as hemophilia or other clotting disorders. BEFORE THE TRANSFUSION  Who gives blood for transfusions?   Healthy volunteers who are fully evaluated to make sure their blood is safe. This is blood bank blood. Transfusion therapy is the safest it has ever been in the practice of medicine. Before blood is taken from a donor, a complete history is taken to make sure that person has no history of diseases nor engages in risky social behavior (examples are intravenous drug use or sexual activity with multiple partners). The donor's travel history is screened to minimize risk of transmitting infections, such as malaria. The donated blood is tested for signs of infectious diseases, such as HIV and hepatitis. The blood is then tested to be sure it is compatible with you in order to minimize the chance of a transfusion reaction. If you or a relative donates blood, this is often done in anticipation of surgery and is not appropriate for emergency situations. It takes many days to process the donated blood. RISKS AND COMPLICATIONS Although transfusion therapy is very safe and saves many lives, the main dangers of transfusion include:  1. Getting an infectious disease. 2. Developing a transfusion reaction. This is an allergic reaction to something in the blood you were given. Every precaution is taken to prevent this. The decision to have a blood transfusion has been considered carefully by your caregiver before blood is given. Blood is not given unless the benefits outweigh  the risks. AFTER THE TRANSFUSION  Right after receiving a blood transfusion, you will usually feel much better and more energetic. This is especially true if your red blood cells  have gotten low (anemic). The transfusion raises the level of the red blood cells which carry oxygen, and this usually causes an energy increase.  The nurse administering the transfusion will monitor you carefully for complications. HOME CARE INSTRUCTIONS  No special instructions are needed after a transfusion. You may find your energy is better. Speak with your caregiver about any limitations on activity for underlying diseases you may have. SEEK MEDICAL CARE IF:   Your condition is not improving after your transfusion.  You develop redness or irritation at the intravenous (IV) site. SEEK IMMEDIATE MEDICAL CARE IF:  Any of the following symptoms occur over the next 12 hours:  Shaking chills.  You have a temperature by mouth above 102 F (38.9 C), not controlled by medicine.  Chest, back, or muscle pain.  People around you feel you are not acting correctly or are confused.  Shortness of breath or difficulty breathing.  Dizziness and fainting.  You get a rash or develop hives.  You have a decrease in urine output.  Your urine turns a dark color or changes to pink, red, or brown. Any of the following symptoms occur over the next 10 days:  You have a temperature by mouth above 102 F (38.9 C), not controlled by medicine.  Shortness of breath.  Weakness after normal activity.  The white part of the eye turns yellow (jaundice).  You have a decrease in the amount of urine or are urinating less often.  Your urine turns a dark color or changes to pink, red, or brown. Document Released: 07/15/2000 Document Revised: 10/10/2011 Document Reviewed: 03/03/2008 ExitCare Patient Information 2014 [place].  _______________________________________________________________________  Incentive Spirometer  An incentive spirometer is a tool that can help keep your lungs clear and active. This tool measures how well you are filling your lungs with each breath. Taking long deep  breaths may help reverse or decrease the chance of developing breathing (pulmonary) problems (especially infection) following:  A long period of time when you are unable to move or be active. BEFORE THE PROCEDURE   If the spirometer includes an indicator to show your best effort, your nurse or respiratory therapist will set it to a desired goal.  If possible, sit up straight or lean slightly forward. Try not to slouch.  Hold the incentive spirometer in an upright position. INSTRUCTIONS FOR USE  3. Sit on the edge of your bed if possible, or sit up as far as you can in bed or on a chair. 4. Hold the incentive spirometer in an upright position. 5. Breathe out normally. 6. Place the mouthpiece in your mouth and seal your lips tightly around it. 7. Breathe in slowly and as deeply as possible, raising the piston or the ball toward the top of the column. 8. Hold your breath for 3-5 seconds or for as long as possible. Allow the piston or ball to fall to the bottom of the column. 9. Remove the mouthpiece from your mouth and breathe out normally. 10. Rest for a few seconds and repeat Steps 1 through 7 at least 10 times every 1-2 hours when you are awake. Take your time and take a few normal breaths between deep breaths. 11. The spirometer may include an indicator to show your best effort. Use the indicator as a goal to work  toward during each repetition. 12. After each set of 10 deep breaths, practice coughing to be sure your lungs are clear. If you have an incision (the cut made at the time of surgery), support your incision when coughing by placing a pillow or rolled up towels firmly against it. Once you are able to get out of bed, walk around indoors and cough well. You may stop using the incentive spirometer when instructed by your caregiver.  RISKS AND COMPLICATIONS  Take your time so you do not get dizzy or light-headed.  If you are in pain, you may need to take or ask for pain medication  before doing incentive spirometry. It is harder to take a deep breath if you are having pain. AFTER USE  Rest and breathe slowly and easily.  It can be helpful to keep track of a log of your progress. Your caregiver can provide you with a simple table to help with this. If you are using the spirometer at home, follow these instructions: [place] IF:   You are having difficultly using the spirometer.  You have trouble using the spirometer as often as instructed.  Your pain medication is not giving enough relief while using the spirometer.  You develop fever of 100.5 F (38.1 C) or higher. SEEK IMMEDIATE MEDICAL CARE IF:   You cough up bloody sputum that had not been present before.  You develop fever of 102 F (38.9 C) or greater.  You develop worsening pain at or near the incision site. MAKE SURE YOU:   Understand these instructions.  Will watch your condition.  Will get help right away if you are not doing well or get worse. Document Released: 11/28/2006 Document Revised: 10/10/2011 Document Reviewed: 01/29/2007 [facility] Patient Information 2014 [place], [place].   ________________________________________________________________________

## 2016-01-14 ENCOUNTER — Encounter (HOSPITAL_COMMUNITY): Payer: Self-pay

## 2016-01-14 ENCOUNTER — Encounter (HOSPITAL_COMMUNITY)
Admission: RE | Admit: 2016-01-14 | Discharge: 2016-01-14 | Disposition: A | Discharge status: Home / Self Care | Payer: BLUE CROSS/BLUE SHIELD | Source: Ambulatory Visit | Attending: Orthopedic Surgery | Admitting: Orthopedic Surgery

## 2016-01-14 DIAGNOSIS — M1612 Unilateral primary osteoarthritis, left hip: Secondary | ICD-10-CM | POA: Diagnosis not present

## 2016-01-14 DIAGNOSIS — Z01812 Encounter for preprocedural laboratory examination: Secondary | ICD-10-CM | POA: Insufficient documentation

## 2016-01-14 HISTORY — DX: Adverse effect of unspecified anesthetic, initial encounter: T41.45XA

## 2016-01-14 HISTORY — DX: Nausea with vomiting, unspecified: R11.2

## 2016-01-14 HISTORY — DX: Gastro-esophageal reflux disease without esophagitis: K21.9

## 2016-01-14 HISTORY — DX: Other specified postprocedural states: Z98.890

## 2016-01-14 HISTORY — DX: Unspecified osteoarthritis, unspecified site: M19.90

## 2016-01-14 HISTORY — DX: Other complications of anesthesia, initial encounter: T88.59XA

## 2016-01-14 LAB — SURGICAL PCR SCREEN
MRSA, PCR: NEGATIVE
Staphylococcus aureus: NEGATIVE

## 2016-01-14 LAB — CBC
HCT: 43.9 % (ref 36.0–46.0)
Hemoglobin: 14.2 g/dL (ref 12.0–15.0)
MCH: 26.4 pg (ref 26.0–34.0)
MCHC: 32.3 g/dL (ref 30.0–36.0)
MCV: 81.8 fL (ref 78.0–100.0)
Platelets: 267 10*3/uL (ref 150–400)
RBC: 5.37 MIL/uL — ABNORMAL HIGH (ref 3.87–5.11)
RDW: 13.8 % (ref 11.5–15.5)
WBC: 5.6 10*3/uL (ref 4.0–10.5)

## 2016-01-15 ENCOUNTER — Other Ambulatory Visit: Payer: Self-pay | Admitting: Nurse Practitioner

## 2016-01-15 MED ORDER — SULFACETAMIDE SODIUM 10 % OP SOLN: 1.0000 [drp] | OPHTHALMIC | Status: DC

## 2016-02-01 ENCOUNTER — Inpatient Hospital Stay (HOSPITAL_COMMUNITY): Payer: BLUE CROSS/BLUE SHIELD

## 2016-02-01 ENCOUNTER — Inpatient Hospital Stay (HOSPITAL_COMMUNITY)
Admission: RE | Admit: 2016-02-01 | Discharge: 2016-02-02 | DRG: 470 | Disposition: A | Discharge status: Home Health | Payer: BLUE CROSS/BLUE SHIELD | Source: Ambulatory Visit | Attending: Orthopedic Surgery | Admitting: Orthopedic Surgery

## 2016-02-01 ENCOUNTER — Inpatient Hospital Stay (HOSPITAL_COMMUNITY): Payer: BLUE CROSS/BLUE SHIELD | Admitting: Anesthesiology

## 2016-02-01 ENCOUNTER — Encounter (HOSPITAL_COMMUNITY): Payer: Self-pay | Admitting: *Deleted

## 2016-02-01 ENCOUNTER — Encounter (HOSPITAL_COMMUNITY)
Admission: RE | Disposition: A | Discharge status: Home Health | Payer: Self-pay | Source: Ambulatory Visit | Attending: Orthopedic Surgery

## 2016-02-01 DIAGNOSIS — Z6834 Body mass index (BMI) 34.0-34.9, adult: Secondary | ICD-10-CM | POA: Diagnosis not present

## 2016-02-01 DIAGNOSIS — Z87891 Personal history of nicotine dependence: Secondary | ICD-10-CM | POA: Diagnosis not present

## 2016-02-01 DIAGNOSIS — M797 Fibromyalgia: Secondary | ICD-10-CM | POA: Diagnosis present

## 2016-02-01 DIAGNOSIS — E039 Hypothyroidism, unspecified: Secondary | ICD-10-CM | POA: Diagnosis present

## 2016-02-01 DIAGNOSIS — E669 Obesity, unspecified: Secondary | ICD-10-CM | POA: Diagnosis present

## 2016-02-01 DIAGNOSIS — M62838 Other muscle spasm: Secondary | ICD-10-CM | POA: Diagnosis not present

## 2016-02-01 DIAGNOSIS — M1612 Unilateral primary osteoarthritis, left hip: Principal | ICD-10-CM | POA: Diagnosis present

## 2016-02-01 DIAGNOSIS — M25552 Pain in left hip: Secondary | ICD-10-CM | POA: Diagnosis present

## 2016-02-01 DIAGNOSIS — Z96642 Presence of left artificial hip joint: Secondary | ICD-10-CM

## 2016-02-01 HISTORY — PX: TOTAL HIP ARTHROPLASTY: SHX124

## 2016-02-01 LAB — PROTIME-INR
INR: 1.02 (ref 0.00–1.49)
Prothrombin Time: 13.2 seconds (ref 11.6–15.2)

## 2016-02-01 LAB — APTT: aPTT: 29 seconds (ref 24–37)

## 2016-02-01 LAB — BASIC METABOLIC PANEL
Anion gap: 8 (ref 5–15)
BUN: 15 mg/dL (ref 6–20)
CO2: 26 mmol/L (ref 22–32)
Calcium: 9.3 mg/dL (ref 8.9–10.3)
Chloride: 106 mmol/L (ref 101–111)
Creatinine, Ser: 0.79 mg/dL (ref 0.44–1.00)
GFR calc Af Amer: >60 mL/min (ref >60)
GFR calc non Af Amer: >60 mL/min (ref >60)
Glucose, Bld: 90 mg/dL (ref 65–99)
Potassium: 4.1 mmol/L (ref 3.5–5.1)
Sodium: 140 mmol/L (ref 135–145)

## 2016-02-01 LAB — TYPE AND SCREEN
ABO/RH(D): A NEG
Antibody Screen: NEGATIVE

## 2016-02-01 LAB — ABO/RH: ABO/RH(D): A NEG

## 2016-02-01 SURGERY — ARTHROPLASTY, HIP, TOTAL, ANTERIOR APPROACH
Anesthesia: Spinal | Site: Hip | Laterality: Left

## 2016-02-01 MED ORDER — MIDAZOLAM HCL 5 MG/5ML IJ SOLN
INTRAMUSCULAR | Status: DC | PRN
  Administered 2016-02-01: 2 mg via INTRAVENOUS

## 2016-02-01 MED ORDER — PHENOL 1.4 % MT LIQD
1.0000 | OROMUCOSAL | Status: DC | PRN
  Filled 2016-02-01: qty 177

## 2016-02-01 MED ORDER — VANCOMYCIN HCL IN DEXTROSE 1-5 GM/200ML-% IV SOLN
1000.0000 mg | Freq: Two times a day (BID) | INTRAVENOUS | Status: AC
  Administered 2016-02-01: 1000 mg via INTRAVENOUS
  Filled 2016-02-01: qty 200

## 2016-02-01 MED ORDER — PROPOFOL 10 MG/ML IV BOLUS
INTRAVENOUS | Status: AC
  Filled 2016-02-01: qty 20

## 2016-02-01 MED ORDER — LEVOTHYROXINE SODIUM 100 MCG PO TABS
100.0000 ug | ORAL_TABLET | Freq: Every day | ORAL | Status: DC
  Administered 2016-02-02: 100 ug via ORAL
  Filled 2016-02-01: qty 1

## 2016-02-01 MED ORDER — HYDROMORPHONE HCL 2 MG/ML IJ SOLN
INTRAMUSCULAR | Status: AC
  Filled 2016-02-01: qty 1

## 2016-02-01 MED ORDER — ACETAMINOPHEN 325 MG PO TABS: 650.0000 mg | ORAL_TABLET | Freq: Four times a day (QID) | ORAL | Status: DC | PRN

## 2016-02-01 MED ORDER — VANCOMYCIN HCL IN DEXTROSE 1-5 GM/200ML-% IV SOLN
1000.0000 mg | INTRAVENOUS | Status: AC
  Administered 2016-02-01: 1000 mg via INTRAVENOUS
  Filled 2016-02-01: qty 200

## 2016-02-01 MED ORDER — DEXAMETHASONE SODIUM PHOSPHATE 10 MG/ML IJ SOLN
INTRAMUSCULAR | Status: AC
Start: 2016-02-01 — End: 2016-02-01
  Filled 2016-02-01: qty 1

## 2016-02-01 MED ORDER — ROCURONIUM BROMIDE 100 MG/10ML IV SOLN
INTRAVENOUS | Status: AC
  Filled 2016-02-01: qty 1

## 2016-02-01 MED ORDER — HYDROMORPHONE HCL 1 MG/ML IJ SOLN
0.5000 mg | INTRAMUSCULAR | Status: DC | PRN
  Administered 2016-02-01: 0.5 mg via INTRAVENOUS
  Filled 2016-02-01: qty 1

## 2016-02-01 MED ORDER — METOCLOPRAMIDE HCL 5 MG PO TABS: 5.0000 mg | ORAL_TABLET | Freq: Three times a day (TID) | ORAL | Status: DC | PRN

## 2016-02-01 MED ORDER — PROPOFOL 500 MG/50ML IV EMUL
INTRAVENOUS | Status: DC | PRN
  Administered 2016-02-01: 75 ug/kg/min via INTRAVENOUS

## 2016-02-01 MED ORDER — PHENYLEPHRINE HCL 10 MG/ML IJ SOLN
INTRAMUSCULAR | Status: DC | PRN
  Administered 2016-02-01 (×2): 80 ug via INTRAVENOUS

## 2016-02-01 MED ORDER — METOCLOPRAMIDE HCL 5 MG/ML IJ SOLN: 5.0000 mg | Freq: Three times a day (TID) | INTRAMUSCULAR | Status: DC | PRN

## 2016-02-01 MED ORDER — SUGAMMADEX SODIUM 200 MG/2ML IV SOLN
INTRAVENOUS | Status: AC
  Filled 2016-02-01: qty 2

## 2016-02-01 MED ORDER — FERROUS SULFATE 325 (65 FE) MG PO TABS
325.0000 mg | ORAL_TABLET | Freq: Three times a day (TID) | ORAL | Status: DC
  Administered 2016-02-02: 325 mg via ORAL
  Filled 2016-02-01: qty 1

## 2016-02-01 MED ORDER — HYDROMORPHONE HCL 1 MG/ML IJ SOLN
INTRAMUSCULAR | Status: AC
  Filled 2016-02-01: qty 1

## 2016-02-01 MED ORDER — ASPIRIN 81 MG PO CHEW
81.0000 mg | CHEWABLE_TABLET | Freq: Two times a day (BID) | ORAL | Status: DC
  Administered 2016-02-01 – 2016-02-02 (×2): 81 mg via ORAL
  Filled 2016-02-01 (×2): qty 1

## 2016-02-01 MED ORDER — ADULT MULTIVITAMIN W/MINERALS CH
1.0000 | ORAL_TABLET | Freq: Every day | ORAL | Status: DC
  Administered 2016-02-01 – 2016-02-02 (×2): 1 via ORAL
  Filled 2016-02-01 (×2): qty 1

## 2016-02-01 MED ORDER — KETOROLAC TROMETHAMINE 30 MG/ML IJ SOLN: 30.0000 mg | Freq: Once | INTRAMUSCULAR | Status: DC

## 2016-02-01 MED ORDER — MIDAZOLAM HCL 2 MG/2ML IJ SOLN
INTRAMUSCULAR | Status: AC
  Filled 2016-02-01: qty 2

## 2016-02-01 MED ORDER — POLYETHYLENE GLYCOL 3350 17 G PO PACK: 17.0000 g | PACK | Freq: Every day | ORAL | Status: DC | PRN

## 2016-02-01 MED ORDER — HYDROMORPHONE HCL 1 MG/ML IJ SOLN
INTRAMUSCULAR | Status: DC | PRN
  Administered 2016-02-01 (×2): 1 mg via INTRAVENOUS

## 2016-02-01 MED ORDER — PROMETHAZINE HCL 25 MG/ML IJ SOLN: 6.2500 mg | INTRAMUSCULAR | Status: DC | PRN

## 2016-02-01 MED ORDER — METHOCARBAMOL 1000 MG/10ML IJ SOLN
500.0000 mg | Freq: Four times a day (QID) | INTRAVENOUS | Status: DC | PRN
  Filled 2016-02-01: qty 5

## 2016-02-01 MED ORDER — SODIUM CHLORIDE 0.9 % IR SOLN
Status: DC | PRN
  Administered 2016-02-01: 1000 mL

## 2016-02-01 MED ORDER — MENTHOL 3 MG MT LOZG: 1.0000 | LOZENGE | OROMUCOSAL | Status: DC | PRN

## 2016-02-01 MED ORDER — SODIUM CHLORIDE 0.9 % IV SOLN
INTRAVENOUS | Status: DC
  Administered 2016-02-01 – 2016-02-02 (×2): via INTRAVENOUS
  Filled 2016-02-01 (×4): qty 1000

## 2016-02-01 MED ORDER — DOCUSATE SODIUM 100 MG PO CAPS
100.0000 mg | ORAL_CAPSULE | Freq: Two times a day (BID) | ORAL | Status: DC
  Administered 2016-02-01 – 2016-02-02 (×2): 100 mg via ORAL
  Filled 2016-02-01 (×2): qty 1

## 2016-02-01 MED ORDER — DEXAMETHASONE SODIUM PHOSPHATE 10 MG/ML IJ SOLN
10.0000 mg | Freq: Once | INTRAMUSCULAR | Status: AC
  Administered 2016-02-02: 10 mg via INTRAVENOUS
  Filled 2016-02-01: qty 1

## 2016-02-01 MED ORDER — ONDANSETRON HCL 4 MG PO TABS: 4.0000 mg | ORAL_TABLET | Freq: Four times a day (QID) | ORAL | Status: DC | PRN

## 2016-02-01 MED ORDER — LACTATED RINGERS IV SOLN
INTRAVENOUS | Status: DC
  Administered 2016-02-01 (×3): via INTRAVENOUS

## 2016-02-01 MED ORDER — DEXAMETHASONE SODIUM PHOSPHATE 10 MG/ML IJ SOLN
10.0000 mg | Freq: Once | INTRAMUSCULAR | Status: AC
  Administered 2016-02-01: 10 mg via INTRAVENOUS

## 2016-02-01 MED ORDER — ONDANSETRON HCL 4 MG/2ML IJ SOLN
INTRAMUSCULAR | Status: AC
  Filled 2016-02-01: qty 2

## 2016-02-01 MED ORDER — HYDROMORPHONE HCL 1 MG/ML IJ SOLN
0.2500 mg | INTRAMUSCULAR | Status: DC | PRN
  Administered 2016-02-01 (×3): 0.5 mg via INTRAVENOUS

## 2016-02-01 MED ORDER — PHENYLEPHRINE 40 MCG/ML (10ML) SYRINGE FOR IV PUSH (FOR BLOOD PRESSURE SUPPORT)
PREFILLED_SYRINGE | INTRAVENOUS | Status: AC
  Filled 2016-02-01: qty 10

## 2016-02-01 MED ORDER — SULFACETAMIDE SODIUM 10 % OP SOLN
1.0000 [drp] | Freq: Four times a day (QID) | OPHTHALMIC | Status: DC
  Administered 2016-02-02 (×2): 1 [drp] via OPHTHALMIC
  Filled 2016-02-01: qty 15

## 2016-02-01 MED ORDER — MEPERIDINE HCL 50 MG/ML IJ SOLN: 6.2500 mg | INTRAMUSCULAR | Status: DC | PRN

## 2016-02-01 MED ORDER — FENTANYL CITRATE (PF) 100 MCG/2ML IJ SOLN
INTRAMUSCULAR | Status: DC | PRN
  Administered 2016-02-01: 100 ug via INTRAVENOUS

## 2016-02-01 MED ORDER — PROPOFOL 10 MG/ML IV BOLUS
INTRAVENOUS | Status: DC | PRN
  Administered 2016-02-01: 150 mg via INTRAVENOUS

## 2016-02-01 MED ORDER — TRANEXAMIC ACID 1000 MG/10ML IV SOLN
1000.0000 mg | INTRAVENOUS | Status: AC
  Administered 2016-02-01: 1000 mg via INTRAVENOUS
  Filled 2016-02-01: qty 10

## 2016-02-01 MED ORDER — ROCURONIUM BROMIDE 100 MG/10ML IV SOLN
INTRAVENOUS | Status: DC | PRN
  Administered 2016-02-01: 45 mg via INTRAVENOUS
  Administered 2016-02-01: 5 mg via INTRAVENOUS

## 2016-02-01 MED ORDER — STERILE WATER FOR IRRIGATION IR SOLN
Status: DC | PRN
  Administered 2016-02-01: 2000 mL

## 2016-02-01 MED ORDER — SUGAMMADEX SODIUM 200 MG/2ML IV SOLN
INTRAVENOUS | Status: DC | PRN
Start: 2016-02-01 — End: 2016-02-01
  Administered 2016-02-01: 200 mg via INTRAVENOUS

## 2016-02-01 MED ORDER — SUCCINYLCHOLINE CHLORIDE 20 MG/ML IJ SOLN
INTRAMUSCULAR | Status: DC | PRN
  Administered 2016-02-01: 100 mg via INTRAVENOUS

## 2016-02-01 MED ORDER — FENTANYL CITRATE (PF) 100 MCG/2ML IJ SOLN
INTRAMUSCULAR | Status: AC
  Filled 2016-02-01: qty 2

## 2016-02-01 MED ORDER — ONDANSETRON HCL 4 MG/2ML IJ SOLN: 4.0000 mg | Freq: Four times a day (QID) | INTRAMUSCULAR | Status: DC | PRN

## 2016-02-01 MED ORDER — ALUM & MAG HYDROXIDE-SIMETH 200-200-20 MG/5ML PO SUSP
30.0000 mL | ORAL | Status: DC | PRN
  Administered 2016-02-02: 30 mL via ORAL
  Filled 2016-02-01: qty 30

## 2016-02-01 MED ORDER — METHOCARBAMOL 500 MG PO TABS: 500.0000 mg | ORAL_TABLET | Freq: Four times a day (QID) | ORAL | Status: DC | PRN

## 2016-02-01 MED ORDER — DIPHENHYDRAMINE HCL 12.5 MG/5ML PO ELIX: 12.5000 mg | ORAL_SOLUTION | ORAL | Status: DC | PRN

## 2016-02-01 MED ORDER — BUPIVACAINE HCL (PF) 0.5 % IJ SOLN
INTRAMUSCULAR | Status: AC
  Filled 2016-02-01: qty 30

## 2016-02-01 MED ORDER — ACETAMINOPHEN 650 MG RE SUPP: 650.0000 mg | Freq: Four times a day (QID) | RECTAL | Status: DC | PRN

## 2016-02-01 MED ORDER — LIDOCAINE HCL (CARDIAC) 20 MG/ML IV SOLN
INTRAVENOUS | Status: DC | PRN
  Administered 2016-02-01: 50 mg via INTRAVENOUS

## 2016-02-01 MED ORDER — LORATADINE 10 MG PO TABS: 10.0000 mg | ORAL_TABLET | Freq: Every day | ORAL | Status: DC | PRN

## 2016-02-01 MED ORDER — HYDROCODONE-ACETAMINOPHEN 7.5-325 MG PO TABS
1.0000 | ORAL_TABLET | ORAL | Status: DC | PRN
  Administered 2016-02-01 – 2016-02-02 (×4): 2 via ORAL
  Administered 2016-02-02: 1 via ORAL
  Administered 2016-02-02: 2 via ORAL
  Filled 2016-02-01 (×6): qty 2

## 2016-02-01 SURGICAL SUPPLY — 35 items
BAG SPEC THK2 15X12 ZIP CLS (MISCELLANEOUS) ×1
BAG ZIPLOCK 12X15 (MISCELLANEOUS) ×1 IMPLANT
CAPT HIP TOTAL 2 ×1 IMPLANT
CLOTH BEACON ORANGE TIMEOUT ST (SAFETY) ×2 IMPLANT
COVER PERINEAL POST (MISCELLANEOUS) ×2 IMPLANT
DRAPE STERI IOBAN 125X83 (DRAPES) ×2 IMPLANT
DRAPE U-SHAPE 47X51 STRL (DRAPES) ×4 IMPLANT
DRESSING AQUACEL AG SP 3.5X10 (GAUZE/BANDAGES/DRESSINGS) ×1 IMPLANT
DRSG AQUACEL AG SP 3.5X10 (GAUZE/BANDAGES/DRESSINGS) ×2
DURAPREP 26ML APPLICATOR (WOUND CARE) ×2 IMPLANT
ELECT REM PT RETURN 9FT ADLT (ELECTROSURGICAL) ×2
ELECTRODE REM PT RTRN 9FT ADLT (ELECTROSURGICAL) ×1 IMPLANT
GLOVE BIOGEL PI IND STRL 7.0 (GLOVE) IMPLANT
GLOVE BIOGEL PI IND STRL 7.5 (GLOVE) ×1 IMPLANT
GLOVE BIOGEL PI IND STRL 8.5 (GLOVE) ×1 IMPLANT
GLOVE BIOGEL PI INDICATOR 7.0 (GLOVE) ×1
GLOVE BIOGEL PI INDICATOR 7.5 (GLOVE) ×4
GLOVE BIOGEL PI INDICATOR 8.5 (GLOVE) ×1
GLOVE ECLIPSE 8.0 STRL XLNG CF (GLOVE) ×4 IMPLANT
GLOVE ORTHO TXT STRL SZ7.5 (GLOVE) ×2 IMPLANT
GLOVE SURG SS PI 7.0 STRL IVOR (GLOVE) ×1 IMPLANT
GLOVE SURG SS PI 7.5 STRL IVOR (GLOVE) ×1 IMPLANT
GOWN STRL REUS W/TWL LRG LVL3 (GOWN DISPOSABLE) ×2 IMPLANT
GOWN STRL REUS W/TWL XL LVL3 (GOWN DISPOSABLE) ×4 IMPLANT
HOLDER FOLEY CATH W/STRAP (MISCELLANEOUS) ×2 IMPLANT
LIQUID BAND (GAUZE/BANDAGES/DRESSINGS) ×2 IMPLANT
PACK ANTERIOR HIP CUSTOM (KITS) ×2 IMPLANT
SAW OSC TIP CART 19.5X105X1.3 (SAW) ×2 IMPLANT
SUT MNCRL AB 4-0 PS2 18 (SUTURE) ×2 IMPLANT
SUT VIC AB 1 CT1 36 (SUTURE) ×6 IMPLANT
SUT VIC AB 2-0 CT1 27 (SUTURE) ×4
SUT VIC AB 2-0 CT1 TAPERPNT 27 (SUTURE) ×2 IMPLANT
SUT VLOC 180 0 24IN GS25 (SUTURE) ×2 IMPLANT
TRAY FOLEY W/METER SILVER 14FR (SET/KITS/TRAYS/PACK) ×1 IMPLANT
YANKAUER SUCT BULB TIP 10FT TU (MISCELLANEOUS) ×1 IMPLANT

## 2016-02-01 NOTE — Transfer of Care (Signed)
Immediate Anesthesia Transfer of Care Note  Patient: Wendy Marshall  Procedure(s) Performed: Procedure(s) with comments: LEFT TOTAL HIP ARTHROPLASTY ANTERIOR APPROACH (Left) - Failed Spinal to LMA  Patient Location: PACU  Anesthesia Type:General and Spinal  Level of Consciousness: awake, alert  and oriented  Airway & Oxygen Therapy: Patient Spontanous Breathing and Patient connected to face mask oxygen  Post-op Assessment: Report given to RN, Post -op Vital signs reviewed and stable and able to wiggle feet and feel touch to toes.  Post vital signs: Reviewed and stable  Last Vitals:  Filed Vitals:   02/01/16 0740  BP: 135/91  Pulse: 88  Temp: 36.7 C  Resp: 16    Last Pain:  Filed Vitals:   02/01/16 0837  PainSc: 4          Complications: No apparent anesthesia complications

## 2016-02-01 NOTE — Anesthesia Procedure Notes (Addendum)
Spinal Patient location during procedure: OR Start time: 02/01/2016 10:22 AM End time: 02/01/2016 10:27 AM Staffing Resident/CRNA: Noralyn Pick D Performed by: resident/CRNA  Preanesthetic Checklist Completed: patient identified, surgical consent, pre-op evaluation, timeout performed, IV checked, risks and benefits discussed and monitors and equipment checked Spinal Block Patient position: sitting Prep: DuraPrep Patient monitoring: heart rate, cardiac monitor, continuous pulse ox and blood pressure Approach: midline Location: L3-4 Injection technique: single-shot Needle Needle type: Sprotte  Needle gauge: 24 G Needle length: 9 cm Needle insertion depth: 7 cm Assessment Sensory level: T12  Procedure Name: Intubation Date/Time: 02/01/2016 11:04 AM Performed by: Noralyn Pick D Pre-anesthesia Checklist: Patient identified, Emergency Drugs available, Suction available and Patient being monitored Patient Re-evaluated:Patient Re-evaluated prior to inductionOxygen Delivery Method: Circle system utilized Preoxygenation: Pre-oxygenation with 100% oxygen Intubation Type: IV induction Ventilation: Mask ventilation without difficulty Laryngoscope Size: Mac and 4 Grade View: Grade II Tube type: Oral Tube size: 7.5 mm Number of attempts: 1 Airway Equipment and Method: Stylet Placement Confirmation: ETT inserted through vocal cords under direct vision,  positive ETCO2 and breath sounds checked- equal and bilateral Secured at: 21 cm Tube secured with: Tape Dental Injury: Teeth and Oropharynx as per pre-operative assessment

## 2016-02-01 NOTE — Progress Notes (Signed)
X-ray results noted 

## 2016-02-01 NOTE — Anesthesia Postprocedure Evaluation (Signed)
Anesthesia Post Note  Patient: Wendy Marshall  Procedure(s) Performed: Procedure(s) (LRB): LEFT TOTAL HIP ARTHROPLASTY ANTERIOR APPROACH (Left)  Patient location during evaluation: PACU Anesthesia Type: General Level of consciousness: sedated Pain management: pain level controlled Vital Signs Assessment: post-procedure vital signs reviewed and stable Respiratory status: spontaneous breathing Cardiovascular status: stable Postop Assessment: no signs of nausea or vomiting and spinal receding Anesthetic complications: no     Last Vitals:  Filed Vitals:   02/01/16 1230 02/01/16 1235  BP: 131/86   Pulse: 84 85  Temp:    Resp: 15 11    Last Pain:  Filed Vitals:   02/01/16 1239  PainSc: 7    Pain Goal:                 Laterica Matarazzo JR,JOHN Haskell Rihn

## 2016-02-01 NOTE — Evaluation (Signed)
Physical Therapy Evaluation Patient Details Name: Wendy Marshall MRN: [id number] DOB: 1956-01-13 Today's Date: 02/01/2016   History of Present Illness  s/p L DA THA  Clinical Impression  Pt is s/p THA resulting in the deficits listed below (see PT Problem List). * Pt will benefit from skilled PT to increase their independence and safety with mobility to allow discharge to the venue listed below.  Pt limited today by pain, decr BP, doesn't tol pain meds well; should progress well once pain controlled; stood at EOB today with min assist     Follow Up Recommendations Home health PT;Supervision - Intermittent    Equipment Recommendations  None recommended by PT    Recommendations for Other Services       Precautions / Restrictions Precautions Precautions: Fall Restrictions Weight Bearing Restrictions: No Other Position/Activity Restrictions: WBAT      Mobility  Bed Mobility Overal bed mobility: Needs Assistance Bed Mobility: Supine to Sit;Sit to Supine     Supine to sit: Min assist Sit to supine: Mod assist   General bed mobility comments: assist with trunk to upright; assist with LEs and trunk to supine  Transfers Overall transfer level: Needs assistance Equipment used: Rolling walker (2 wheeled) Transfers: Sit to/from Stand Sit to Stand: Min assist         General transfer comment: for safety, cues for hand placement; pt dizzy in standing  Ambulation/Gait             General Gait Details: lateral steps only along EOB with RW and min assist; pt was diaphoretic, nauseous--returned to supine--BP 102/58, sats 97%, HR 91; Rb aware  Stairs            Wheelchair Mobility    Modified Rankin (Stroke Patients Only)       Balance                                             Pertinent Vitals/Pain Pain Assessment: 0-10 Pain Score: 3  Pain Location: L hip Pain Descriptors / Indicators: Sore Pain Intervention(s): Limited activity  within patient's tolerance;Monitored during session;Repositioned;Ice applied    Home Living Family/patient expects to be discharged to:: Private residence Living Arrangements: Spouse/significant other Available Help at Discharge: Family;Available 24 hours/day Type of Home: House Home Access: Stairs to enter Entrance Stairs-Rails: None Entrance Stairs-Number of Steps: 2 Home Layout: One level Home Equipment: Walker - 2 wheels      Prior Function Level of Independence: Independent               Hand Dominance        Extremity/Trunk Assessment   Upper Extremity Assessment: Defer to OT evaluation;Overall WFL for tasks assessed           Lower Extremity Assessment: LLE deficits/detail   LLE Deficits / Details: AAROM grossly WFL;  ankle WFL     Communication   Communication: No difficulties  Cognition Arousal/Alertness: Awake/alert Behavior During Therapy: WFL for tasks assessed/performed Overall Cognitive Status: Within Functional Limits for tasks assessed                      General Comments      Exercises        Assessment/Plan    PT Assessment Patient needs continued PT services  PT Diagnosis Difficulty walking   PT Problem List Decreased strength;Decreased activity  tolerance;Decreased mobility;Decreased knowledge of use of DME;Pain  PT Treatment Interventions DME instruction;Gait training;Stair training;Functional mobility training;Therapeutic activities;Therapeutic exercise;Patient/family education   PT Goals (Current goals can be found in the Care Plan section) Acute Rehab PT Goals Patient Stated Goal: less pain, back to IND PT Goal Formulation: With patient Time For Goal Achievement: 02/05/16 Potential to Achieve Goals: Good    Frequency 7X/week   Barriers to discharge        Co-evaluation               End of Session Equipment Utilized During Treatment: Gait belt Activity Tolerance: Patient tolerated treatment  well;Treatment limited secondary to medical complications (Comment) Patient left: in chair;with call bell/phone within reach (bed alarm not functioning properly; NT aware) Nurse Communication: Mobility status         Time: [id number] PT Time Calculation (min) (ACUTE ONLY): 15 min   Charges:   PT Evaluation $PT Eval Low Complexity: 1 Procedure     PT G Codes:        Wendy Marshall Feb 20, 2016, 4:09 PM

## 2016-02-01 NOTE — Progress Notes (Signed)
Portable AP Pelvis and Lateral Left Hip X-rays done. 

## 2016-02-01 NOTE — Interval H&P Note (Signed)
History and Physical Interval Note:  02/01/2016 9:07 AM  Wendy Marshall  has presented today for surgery, with the diagnosis of LEFT HIP OA  The various methods of treatment have been discussed with the patient and family. After consideration of risks, benefits and other options for treatment, the patient has consented to  Procedure(s): LEFT TOTAL HIP ARTHROPLASTY ANTERIOR APPROACH (Left) as a surgical intervention .  The patient's history has been reviewed, patient examined, no change in status, stable for surgery.  I have reviewed the patient's chart and labs.  Questions were answered to the patient's satisfaction.     Mauri Pole

## 2016-02-01 NOTE — Op Note (Signed)
NAME:  Wendy Marshall NO.: [id number]      MEDICAL RECORD NO.: [id number]      FACILITY:  [facility]      PHYSICIAN:  Paralee Cancel D  DATE OF BIRTH:  08/20/1955     DATE OF PROCEDURE:  02/01/2016                                 OPERATIVE REPORT         PREOPERATIVE DIAGNOSIS: Left  hip osteoarthritis.      POSTOPERATIVE DIAGNOSIS:  Left hip osteoarthritis.      PROCEDURE:  Left total hip replacement through an anterior approach   utilizing DePuy THR system, component size 82mm pinnacle cup, a size 36+4 neutral   Altrex liner, a size 1 Hi Tri Lock stem with a 36+1.5 delta ceramic   ball.      SURGEON:  Pietro Cassis. Alvan Dame, M.D.      ASSISTANT:  Molli Barrows, PA-C      ANESTHESIA:  General and Spinal.      SPECIMENS:  None.      COMPLICATIONS:  None.      BLOOD LOSS:  1000 cc     DRAINS:  None.      INDICATION OF THE PROCEDURE:  Wendy Marshall is a 60 y.o. female who had   presented to office for evaluation of left hip pain.  Radiographs revealed   progressive degenerative changes with bone-on-bone   articulation to the  hip joint.  The patient had painful limited range of   motion significantly affecting their overall quality of life.  The patient was failing to    respond to conservative measures, and at this point was ready   to proceed with more definitive measures.  The patient has noted progressive   degenerative changes in his hip, progressive problems and dysfunction   with regarding the hip prior to surgery.  Consent was obtained for   benefit of pain relief.  Specific risk of infection, DVT, component   failure, dislocation, need for revision surgery, as well discussion of   the anterior versus posterior approach were reviewed.  Consent was   obtained for benefit of anterior pain relief through an anterior   approach.      PROCEDURE IN DETAIL:  The patient was brought to operative theater.   Once adequate  anesthesia, preoperative antibiotics, 1 gm of Vancomycin, 1 gm of Tranexamic, and 10 mg of Decadron administered.   The patient was positioned supine on the OSI Hanna table.  Once adequate   padding of boney process was carried out, we had predraped out the hip, and  used fluoroscopy to confirm orientation of the pelvis and position.      The left hip was then prepped and draped from proximal iliac crest to   mid thigh with shower curtain technique.      Time-out was performed identifying the patient, planned procedure, and   extremity.     An incision was then made 2 cm distal and lateral to the   anterior superior iliac spine extending over the orientation of the   tensor fascia lata muscle and sharp dissection was carried down to the   fascia of the muscle and protractor placed in the soft tissues.  The fascia was then incised.  The muscle belly was identified and swept   laterally and retractor placed along the superior neck.  Following   cauterization of the circumflex vessels and removing some pericapsular   fat, a second cobra retractor was placed on the inferior neck.  A third   retractor was placed on the anterior acetabulum after elevating the   anterior rectus.  A L-capsulotomy was along the line of the   superior neck to the trochanteric fossa, then extended proximally and   distally.  Tag sutures were placed and the retractors were then placed   intracapsular.  We then identified the trochanteric fossa and   orientation of my neck cut, confirmed this radiographically   and then made a neck osteotomy with the femur on traction.  The femoral   head was removed without difficulty or complication.  Traction was let   off and retractors were placed posterior and anterior around the   acetabulum.      The labrum and foveal tissue were debrided.  I began reaming with a 3mm   reamer and reamed up to 46mm reamer with good bony bed preparation and a 11mm   cup was chosen.   The final 63mm Pinnacle cup was then impacted under fluoroscopy  to confirm the depth of penetration and orientation with respect to   abduction.  A screw was placed followed by the hole eliminator.  The final   36+4 neutral Altrex liner was impacted with good visualized rim fit.  The cup was positioned anatomically within the acetabular portion of the pelvis.      At this point, the femur was rolled at 80 degrees.  Further capsule was   released off the inferior aspect of the femoral neck.  I then   released the superior capsule proximally.  The hook was placed laterally   along the femur and elevated manually and held in position with the bed   hook.  The leg was then extended and adducted with the leg rolled to 100   degrees of external rotation.  Once the proximal femur was fully   exposed, I used a box osteotome to set orientation.  I then began   broaching with the starting chili pepper broach and passed this by hand and then broached up to 1.  With the 1 broach in place I chose a high offset neck and did several trial reductions.  The offset was appropriate, leg lengths   appeared to be equal best matched with the +1.5, confirmed radiographically.   Given these findings, I went ahead and dislocated the hip, repositioned all   retractors and positioned the right hip in the extended and abducted position.  The final 1 Hi Tri Lock stem was   chosen and it was impacted down to the level of neck cut.  Based on this   and the trial reduction, a 36+1.5 delta ceramic ball was chosen and   impacted onto a clean and dry trunnion, and the hip was reduced.  The   hip had been irrigated throughout the case again at this point.  I did   reapproximate the superior capsular leaflet to the anterior leaflet   using #1 Vicryl.  The fascia of the   tensor fascia lata muscle was then reapproximated using #1 Vicryl.  The   remaining wound was closed with 2-0 Vicryl and running 4-0 Monocryl.   The hip was  cleaned, dried, and dressed sterilely using  Dermabond and   Aquacel dressing.  She was then brought   to recovery room in stable condition tolerating the procedure well.    Molli Barrows, PA-C was present for the entirety of the case involved from   preoperative positioning, perioperative retractor management, general   facilitation of the case, as well as primary wound closure as assistant.            Pietro Cassis Alvan Dame, M.D.        02/01/2016 11:51 AM

## 2016-02-01 NOTE — Anesthesia Preprocedure Evaluation (Signed)
Anesthesia Evaluation  Patient identified by MRN, date of birth, ID band Patient awake    Reviewed: Allergy & Precautions, H&P , NPO status , Patient's Chart, lab work & pertinent test results  Airway Mallampati: II  TM Distance: >3 FB Neck ROM: full    Dental no notable dental hx. (+) Teeth Intact   Pulmonary former smoker,    Pulmonary exam normal        Cardiovascular negative cardio ROS Normal cardiovascular exam     Neuro/Psych negative psych ROS   GI/Hepatic Neg liver ROS,   Endo/Other  Hypothyroidism   Renal/GU negative Renal ROS  negative genitourinary   Musculoskeletal   Abdominal (+) + obese,   Peds  Hematology negative hematology ROS (+)   Anesthesia Other Findings   Reproductive/Obstetrics negative OB ROS                             Anesthesia Physical Anesthesia Plan  ASA: II  Anesthesia Plan: Spinal   Post-op Pain Management:    Induction:   Airway Management Planned:   Additional Equipment:   Intra-op Plan:   Post-operative Plan:   Informed Consent: I have reviewed the patients History and Physical, chart, labs and discussed the procedure including the risks, benefits and alternatives for the proposed anesthesia with the patient or authorized representative who has indicated his/her understanding and acceptance.     Plan Discussed with: CRNA and Surgeon  Anesthesia Plan Comments:         Anesthesia Quick Evaluation

## 2016-02-01 NOTE — Care Management Note (Signed)
Case Management Note  Patient Details  Name: Wendy Marshall MRN: [id number] Date of Birth: 07/29/1956  Subjective/Objective: 60 yo admitted for Left total hip replacement through an anterior approach                    Action/Plan: From home with spouse. Pt has RW and tub chair at home, but states she needs an 3in1. 3in1 ordered and [facility] DME rep alerted of order. Choice offered for [facility] services and [facility] chosen. [facility] rep alerted of referral. No other CM needs communicated.  Expected Discharge Date:                  Expected Discharge Plan:  [place]  In-House Referral:     Discharge planning Services  CM Consult  Post Acute Care Choice:  Home Health Choice offered to:  Patient  DME Arranged:  3-N-1 DME Agency:  [place]:  PT [place]:  [facility] (now [facility])  Status of Service:  In process, will continue to follow  If discussed at Long Length of Stay Meetings, dates discussed:    Additional CommentsLynnell Catalan, RN 02/01/2016, 3:14 PM  [phone]

## 2016-02-02 LAB — BASIC METABOLIC PANEL
Anion gap: 5 (ref 5–15)
BUN: 11 mg/dL (ref 6–20)
CO2: 26 mmol/L (ref 22–32)
Calcium: 8.4 mg/dL — ABNORMAL LOW (ref 8.9–10.3)
Chloride: 105 mmol/L (ref 101–111)
Creatinine, Ser: 0.7 mg/dL (ref 0.44–1.00)
GFR calc Af Amer: >60 mL/min (ref >60)
GFR calc non Af Amer: >60 mL/min (ref >60)
Glucose, Bld: 133 mg/dL — ABNORMAL HIGH (ref 65–99)
Potassium: 4.7 mmol/L (ref 3.5–5.1)
Sodium: 136 mmol/L (ref 135–145)

## 2016-02-02 LAB — CBC
HCT: 33.3 % — ABNORMAL LOW (ref 36.0–46.0)
Hemoglobin: 10.8 g/dL — ABNORMAL LOW (ref 12.0–15.0)
MCH: 26.6 pg (ref 26.0–34.0)
MCHC: 32.4 g/dL (ref 30.0–36.0)
MCV: 82 fL (ref 78.0–100.0)
Platelets: 194 10*3/uL (ref 150–400)
RBC: 4.06 MIL/uL (ref 3.87–5.11)
RDW: 13.6 % (ref 11.5–15.5)
WBC: 10.1 10*3/uL (ref 4.0–10.5)

## 2016-02-02 MED ORDER — DIAZEPAM 5 MG PO TABS
5.0000 mg | ORAL_TABLET | Freq: Four times a day (QID) | ORAL | Status: DC | PRN
  Administered 2016-02-02: 5 mg via ORAL
  Filled 2016-02-02: qty 1

## 2016-02-02 MED ORDER — METHOCARBAMOL 500 MG PO TABS: 500.0000 mg | ORAL_TABLET | Freq: Four times a day (QID) | ORAL | Status: DC | PRN

## 2016-02-02 MED ORDER — DIAZEPAM 5 MG PO TABS: 5.0000 mg | ORAL_TABLET | Freq: Four times a day (QID) | ORAL | Status: DC | PRN

## 2016-02-02 MED ORDER — HYDROCODONE-ACETAMINOPHEN 7.5-325 MG PO TABS: 1.0000 | ORAL_TABLET | ORAL | Status: DC | PRN

## 2016-02-02 NOTE — Evaluation (Signed)
Occupational Therapy Evaluation Patient Details Name: Wendy Marshall MRN: [id number] DOB: 1955/12/21 Today's Date: 02/02/2016    History of Present Illness s/p L DA THA   Clinical Impression   This 60 year old female was admitted for the above sx. She got up to chair, but activity was limited by pain. She will benefit from continued OT  In acute with supervision to min A level goals.  Pt was independent prior to admission    Follow Up Recommendations  Supervision/Assistance - 24 hour    Equipment Recommendations  3 in 1 bedside comode    Recommendations for Other Services       Precautions / Restrictions Precautions Precautions: Fall Restrictions Other Position/Activity Restrictions: WBAT      Mobility Bed Mobility         Supine to sit: Min assist     General bed mobility comments: assist for LLE; used rails  Transfers   Equipment used: Rolling walker (2 wheeled) Transfers: Sit to/from Stand Sit to Stand: Min assist         General transfer comment: steadying assistance; cues for UE/LE placement    Balance                                            ADL Overall ADL's : Needs assistance/impaired             Lower Body Bathing: Moderate assistance;Sit to/from stand       Lower Body Dressing: Maximal assistance;Sit to/from stand   Toilet Transfer: Minimal assistance;Stand-pivot;RW (to recliner)             General ADL Comments: pt is able to perform UB adls with set up.  Increased pain with weight bearing. Pt with some dizziness but not orthostatic. Dizziness did not increase with standing.  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  Reviewed reacher uses for adls.  Pt believes she will be able to sit back on sturdy tub seat and swing legs into tub.     Vision     Perception     Praxis      Pertinent Vitals/Pain Pain Score: 6  (with weight bearing) Pain Location: L  hip Pain Descriptors / Indicators: Burning;Cramping Pain Intervention(s): Limited activity within patient's tolerance;Monitored during session;Premedicated before session;Repositioned;Heat applied     Hand Dominance     Extremity/Trunk Assessment Upper Extremity Assessment Upper Extremity Assessment: Overall WFL for tasks assessed           Communication Communication Communication: No difficulties   Cognition Arousal/Alertness: Awake/alert Behavior During Therapy: WFL for tasks assessed/performed Overall Cognitive Status: Within Functional Limits for tasks assessed                     General Comments       Exercises       Shoulder Instructions      Home Living Family/patient expects to be discharged to:: Private residence Living Arrangements: Spouse/significant other Available Help at Discharge: Family;Available 24 hours/day Type of Home: House             Bathroom Shower/Tub: Tub/shower unit Shower/tub characteristics: Architectural technologist: Standard     Home Equipment: Shower seat          Prior Functioning/Environment Level of Independence: Independent  OT Diagnosis: Acute pain   OT Problem List: Pain;Decreased activity tolerance;Decreased knowledge of use of DME or AE   OT Treatment/Interventions: Self-care/ADL training;DME and/or AE instruction;Patient/family education    OT Goals(Current goals can be found in the care plan section) Acute Rehab OT Goals Patient Stated Goal: less pain, back to IND OT Goal Formulation: With patient Time For Goal Achievement: 02/09/16 Potential to Achieve Goals: Good ADL Goals Pt Will Perform Grooming: with supervision;standing Pt Will Transfer to Toilet: with min guard assist;ambulating;bedside commode Additional ADL Goal #1: pt will don pants/underwear with reacher and min A  OT Frequency: Min 2X/week   Barriers to D/C:            Co-evaluation              End of  Session    Activity Tolerance: Patient limited by pain Patient left: in chair;with call bell/phone within reach   Time: 0730-0752 OT Time Calculation (min): 22 min Charges:  OT General Charges $OT Visit: 1 Procedure OT Evaluation $OT Eval Low Complexity: 1 Procedure G-Codes:    Fishel Wamble 2016/02/24, 8:03 AM  Lesle Chris, OTR/L [phone] 2016-02-24

## 2016-02-02 NOTE — Discharge Summary (Signed)
Physician Discharge Summary   Patient ID: Wendy Marshall MRN: [id number] DOB/AGE: 58/18/1957 58 y.o.  Admit date: 02/01/2016 Discharge date: 02/02/2016  Admission Diagnoses:  Active Problems:   Status post total replacement of left hip   Discharge Diagnoses:  Same   Surgeries: Procedure(s): LEFT TOTAL HIP ARTHROPLASTY ANTERIOR APPROACH on 02/01/2016   Consultants: PT/OT  Discharged Condition: Stable  Hospital Course: Wendy Marshall is an 60 y.o. female who was admitted 02/01/2016 with a chief complaint of left hip pain, and found to have a diagnosis of left hip endstage osteoarthritis.  They were brought to the operating room on 02/01/2016 and underwent the above named procedures.    The patient had an uncomplicated hospital course and was stable for discharge.  Recent vital signs:  Filed Vitals:   02/02/16 0149 02/02/16 0551  BP: 122/52 121/74  Pulse: 91 89  Temp: 98.4 F (36.9 C) 98.4 F (36.9 C)  Resp: 16 16    Recent laboratory studies:  Results for orders placed or performed during the hospital encounter of 02/01/16  Protime-INR  Result Value Ref Range   Prothrombin Time 13.2 11.6 - 15.2 seconds   INR 1.02 0.00 - 1.49  APTT  Result Value Ref Range   aPTT 29 24 - 37 seconds  Basic metabolic panel  Result Value Ref Range   Sodium 140 135 - 145 mmol/L   Potassium 4.1 3.5 - 5.1 mmol/L   Chloride 106 101 - 111 mmol/L   CO2 26 22 - 32 mmol/L   Glucose, Bld 90 65 - 99 mg/dL   BUN 15 6 - 20 mg/dL   Creatinine, Ser 0.79 0.44 - 1.00 mg/dL   Calcium 9.3 8.9 - 10.3 mg/dL   GFR calc non Af Amer >60 >60 mL/min   GFR calc Af Amer >60 >60 mL/min   Anion gap 8 5 - 15  CBC  Result Value Ref Range   WBC 10.1 4.0 - 10.5 K/uL   RBC 4.06 3.87 - 5.11 MIL/uL   Hemoglobin 10.8 (L) 12.0 - 15.0 g/dL   HCT 33.3 (L) 36.0 - 46.0 %   MCV 82.0 78.0 - 100.0 fL   MCH 26.6 26.0 - 34.0 pg   MCHC 32.4 30.0 - 36.0 g/dL   RDW 13.6 11.5 - 15.5 %   Platelets 194 150 - 400 K/uL  Basic  metabolic panel  Result Value Ref Range   Sodium 136 135 - 145 mmol/L   Potassium 4.7 3.5 - 5.1 mmol/L   Chloride 105 101 - 111 mmol/L   CO2 26 22 - 32 mmol/L   Glucose, Bld 133 (H) 65 - 99 mg/dL   BUN 11 6 - 20 mg/dL   Creatinine, Ser 0.70 0.44 - 1.00 mg/dL   Calcium 8.4 (L) 8.9 - 10.3 mg/dL   GFR calc non Af Amer >60 >60 mL/min   GFR calc Af Amer >60 >60 mL/min   Anion gap 5 5 - 15  Type and screen Order type and screen if day of surgery is less than 15 days from draw of preadmission visit or order morning of surgery if day of surgery is greater than 6 days from preadmission visit.  Result Value Ref Range   ABO/RH(D) A NEG    Antibody Screen NEG    Sample Expiration 02/04/2016   ABO/Rh  Result Value Ref Range   ABO/RH(D) A NEG     Discharge Medications:     Medication List    TAKE these  medications        HYDROcodone-acetaminophen 7.5-325 MG tablet  Commonly known as:  NORCO  Take 1-2 tablets by mouth every 4 (four) hours as needed (breakthrough pain).     ICY HOT 5 % Ptch  Generic drug:  Menthol  Place 1 patch onto the skin as needed (For pain.).     levothyroxine 100 MCG tablet  Commonly known as:  SYNTHROID, LEVOTHROID  Take 1 tablet (100 mcg total) by mouth daily.     loratadine 10 MG tablet  Commonly known as:  CLARITIN  Take 10 mg by mouth daily as needed for allergies.     methocarbamol 500 MG tablet  Commonly known as:  ROBAXIN  Take 1 tablet (500 mg total) by mouth every 6 (six) hours as needed for muscle spasms.     mometasone 0.1 % cream  Commonly known as:  ELOCON  Apply to affected area BID prn     multivitamin with minerals Tabs tablet  Take 1 tablet by mouth daily.     sulfacetamide 10 % ophthalmic solution  Commonly known as:  BLEPH-10  Place 1 drop into both eyes every 2 (two) hours while awake. Today; Then QID starting tomorrow        Diagnostic Studies: Dg C-arm 1-60 Min-no Report  02/01/2016  CLINICAL DATA: surgery C-ARM 1-60  MINUTES Fluoroscopy was utilized by the requesting physician.  No radiographic interpretation.   Dg Hip Port Unilat With Pelvis 1v Left  02/01/2016  CLINICAL DATA:  Post left total hip replacement. EXAM: DG C-ARM 1-60 MIN-NO REPORT; DG HIP (WITH OR WITHOUT PELVIS) 1V PORT LEFT COMPARISON:  None. FINDINGS: Examination demonstrates evidence of patient's recent left total hip arthroplasty with prosthetic components intact and normally located. Minimal air in the adjacent lateral hip soft tissues compatible recent surgery. Mild degenerate change of the right hip. IMPRESSION: Expected changes post left total hip arthroplasty. Electronically Signed   By: Marin Olp M.D.   On: 02/01/2016 12:53    Disposition: 01-Home or Self Care      Discharge Instructions    Call MD / Call 911    Complete by:  As directed   If you experience chest pain or shortness of breath, CALL 911 and be transported to the hospital emergency room.  If you develope a fever above 101 F, pus (white drainage) or increased drainage or redness at the wound, or calf pain, call your surgeon's office.     Change dressing    Complete by:  As directed   You may change your dressing on Wednesday, then change the dressing daily with sterile 4 x 4 inch gauze dressing and paper tape.  You may clean the incision with alcohol prior to redressing     Constipation Prevention    Complete by:  As directed   Drink plenty of fluids.  Prune juice may be helpful.  You may use a stool softener, such as Colace (over the counter) 100 mg twice a day.  Use MiraLax (over the counter) for constipation as needed.     Diet - low sodium heart healthy    Complete by:  As directed      Follow the hip precautions as taught in Physical Therapy    Complete by:  As directed      Increase activity slowly as tolerated    Complete by:  As directed               Signed: Wesly Whisenant B  02/02/2016, 8:06 AM

## 2016-02-02 NOTE — Progress Notes (Signed)
OT Cancellation Note  Patient Details Name: Wendy Marshall MRN: [id number] DOB: [birth year]   Cancelled Treatment:    Reason Eval/Treat Not Completed: Other (comment). Checked back with pt as we only got to chair earlier today. She has been to bathroom and has assist for adls.  Did not feel she needed to practice with reacher. Will sign off.   Talana Slatten 02/02/2016, 2:13 PM  Lesle Chris, OTR/L [phone] 02/02/2016

## 2016-02-02 NOTE — Progress Notes (Signed)
   02/02/16 1400  PT Visit Information  Last PT Received On 02/02/16  Assistance Needed +1  History of Present Illness s/p L DA THA  Subjective Data  Subjective I'm better  Patient Stated Goal less pain, back to IND  Precautions  Precautions Fall  Restrictions  Other Position/Activity Restrictions WBAT  Pain Assessment  Pain Assessment 0-10  Pain Score 2  Pain Location L hip, thigh  Pain Descriptors / Indicators Burning  Pain Intervention(s) Limited activity within patient's tolerance;Monitored during session;Premedicated before session;Ice applied  Cognition  Arousal/Alertness Awake/alert  Behavior During Therapy WFL for tasks assessed/performed  Overall Cognitive Status Within Functional Limits for tasks assessed  Bed Mobility  General bed mobility comments (OOB)  Transfers  Overall transfer level Needs assistance  Equipment used Rolling walker (2 wheeled)  Transfers Sit to/from Stand  Sit to Stand Supervision  General transfer comment cues for hand placement  Ambulation/Gait  Ambulation/Gait assistance Supervision;Modified independent (Device/Increase time)  Ambulation Distance (Feet) 120 Feet (10' more)  Assistive device Rolling walker (2 wheeled)  Gait Pattern/deviations Step-to pattern  General Gait Details initial cues for sequence,pt self corrects end of distance  Stairs Yes  Stairs assistance Min guard  Stair Management One rail Right;One rail Left;Step to pattern;Forwards  Number of Stairs 3  General stair comments cues for sequence  PT - End of Session  Activity Tolerance Patient tolerated treatment well;No increased pain  Patient left in chair;with call bell/phone within reach;with chair alarm set  Nurse Communication Mobility status  PT - Assessment/Plan  PT Plan Current plan remains appropriate  PT Frequency (ACUTE ONLY) 7X/week  Follow Up Recommendations Home health PT;Supervision - Intermittent  PT equipment None recommended by PT  PT Goal  Progression  Progress towards PT goals Progressing toward goals  Acute Rehab PT Goals  PT Goal Formulation With patient  Time For Goal Achievement 02/05/16  Potential to Achieve Goals Good  PT Time Calculation  PT Start Time (ACUTE ONLY) 1402  PT Stop Time (ACUTE ONLY) 1423  PT Time Calculation (min) (ACUTE ONLY) 21 min

## 2016-02-02 NOTE — Progress Notes (Signed)
Physical Therapy Treatment Patient Details Name: Wendy Marshall MRN: [id number] DOB: 1956-03-03 Today's Date: 02-15-2016    History of Present Illness s/p L DA THA    PT Comments    Feeling better at time of PT session, was nauseous and painful earlier; will see second session and likely will be able to D/C later today;  Follow Up Recommendations  Home health PT;Supervision - Intermittent     Equipment Recommendations  None recommended by PT    Recommendations for Other Services       Precautions / Restrictions Precautions Precautions: Fall Restrictions Other Position/Activity Restrictions: WBAT    Mobility  Bed Mobility               General bed mobility comments:  (OOB)  Transfers Overall transfer level: Needs assistance Equipment used: Rolling walker (2 wheeled) Transfers: Sit to/from Stand Sit to Stand: Min guard         General transfer comment: cues for hand placement  Ambulation/Gait Ambulation/Gait assistance: Min guard Ambulation Distance (Feet): 120 Feet (15' more) Assistive device: Rolling walker (2 wheeled) Gait Pattern/deviations: Step-to pattern;Step-through pattern;Antalgic     General Gait Details: cues for sequence, RW safety   Stairs            Wheelchair Mobility    Modified Rankin (Stroke Patients Only)       Balance                                    Cognition Arousal/Alertness: Awake/alert Behavior During Therapy: WFL for tasks assessed/performed Overall Cognitive Status: Within Functional Limits for tasks assessed                      Exercises Total Joint Exercises Ankle Circles/Pumps: AROM;Both;10 reps Quad Sets: AROM;Strengthening;Both;10 reps Short Arc Quad: AROM;Strengthening;Left;10 reps Heel Slides: AAROM;Left;10 reps Hip ABduction/ADduction: AROM;Strengthening;Left;10 reps;AAROM    General Comments        Pertinent Vitals/Pain Pain Assessment: 0-10 Pain Score: 4   Pain Location: L hip Pain Descriptors / Indicators: Aching Pain Intervention(s): Limited activity within patient's tolerance;Monitored during session;Premedicated before session;Ice applied    Home Living                      Prior Function            PT Goals (current goals can now be found in the care plan section) Acute Rehab PT Goals Patient Stated Goal: less pain, back to IND PT Goal Formulation: With patient Time For Goal Achievement: 02/05/16 Potential to Achieve Goals: Good Progress towards PT goals: Progressing toward goals    Frequency  7X/week    PT Plan Current plan remains appropriate    Co-evaluation             End of Session Equipment Utilized During Treatment: Gait belt Activity Tolerance: Patient tolerated treatment well;Treatment limited secondary to medical complications (Comment) Patient left: in chair;with call bell/phone within reach;with chair alarm set     Time: 1003-1031 PT Time Calculation (min) (ACUTE ONLY): 28 min  Charges:  $Gait Training: 8-22 mins $Therapeutic Exercise: 8-22 mins                    G Codes:      Kaori Jumper 02/15/2016, 1:37 PM

## 2016-02-02 NOTE — Progress Notes (Signed)
Patient ID: Wendy Marshall, female   DOB: 1956-01-09, 60 y.o.   MRN: [id number] Subjective: 1 Day Post-Op Procedure(s) (LRB): LEFT TOTAL HIP ARTHROPLASTY ANTERIOR APPROACH (Left)    Patient reports pain as moderate.  Doing ok was getting up to chair this am.  Muscle spasms and burning are primary complaints.  Robaxin has not been well tolerated in past.  Has used valium for spasms in past   Objective:   VITALS:   Filed Vitals:   02/02/16 0149 02/02/16 0551  BP: 122/52 121/74  Pulse: 91 89  Temp: 98.4 F (36.9 C) 98.4 F (36.9 C)  Resp: 16 16    Neurovascular intact Incision: dressing C/D/I  LABS  Recent Labs  02/02/16 0409  HGB 10.8*  HCT 33.3*  WBC 10.1  PLT 194     Recent Labs  02/01/16 0812 02/02/16 0409  NA 140 136  K 4.1 4.7  BUN 15 11  CREATININE 0.79 0.70  GLUCOSE 90 133*     Recent Labs  02/01/16 0812  INR 1.02     Assessment/Plan: 1 Day Post-Op Procedure(s) (LRB): LEFT TOTAL HIP ARTHROPLASTY ANTERIOR APPROACH (Left)   Advance diet Up with therapy Discharge home today if does very well with medication change or tomorrow

## 2016-02-04 ENCOUNTER — Encounter (HOSPITAL_COMMUNITY): Payer: Self-pay | Admitting: Orthopedic Surgery

## 2016-02-14 ENCOUNTER — Encounter (HOSPITAL_COMMUNITY): Payer: Self-pay | Admitting: *Deleted

## 2016-02-14 ENCOUNTER — Emergency Department (HOSPITAL_COMMUNITY)
Admission: EM | Admit: 2016-02-14 | Discharge: 2016-02-14 | Disposition: A | Discharge status: Home / Self Care | Payer: BLUE CROSS/BLUE SHIELD | Attending: Emergency Medicine | Admitting: Emergency Medicine

## 2016-02-14 DIAGNOSIS — E039 Hypothyroidism, unspecified: Secondary | ICD-10-CM | POA: Insufficient documentation

## 2016-02-14 DIAGNOSIS — I82402 Acute embolism and thrombosis of unspecified deep veins of left lower extremity: Secondary | ICD-10-CM

## 2016-02-14 DIAGNOSIS — M79605 Pain in left leg: Secondary | ICD-10-CM | POA: Insufficient documentation

## 2016-02-14 DIAGNOSIS — Z87891 Personal history of nicotine dependence: Secondary | ICD-10-CM | POA: Insufficient documentation

## 2016-02-14 DIAGNOSIS — M1990 Unspecified osteoarthritis, unspecified site: Secondary | ICD-10-CM | POA: Insufficient documentation

## 2016-02-14 MED ORDER — ENOXAPARIN SODIUM 100 MG/ML ~~LOC~~ SOLN
1.0000 mg/kg | Freq: Once | SUBCUTANEOUS | Status: AC
  Administered 2016-02-14: 90 mg via SUBCUTANEOUS
  Filled 2016-02-14: qty 1

## 2016-02-14 NOTE — ED Notes (Signed)
Pt had left hip replacement on July 3rd. Pt states that her left upper thigh and the back of her calf have been hurting and she is having some swelling in her left ankle.

## 2016-02-14 NOTE — ED Provider Notes (Signed)
CSN: [id number]     Arrival date & time 02/14/16  1933 History   First MD Initiated Contact with Patient 02/14/16 2036     Chief Complaint  Patient presents with  . Leg Pain   Pt is a 60 yo wf with a left hip replacement on July 3rd.  She has been doing well since the surgery.  She noticed some pain and swelling to her left calf starting yesterday.  She is concerned that she may have a DVT.  She is not on any blood thinners.  Pt denies SOB or CP.  (Consider location/radiation/quality/duration/timing/severity/associated sxs/prior Treatment) Patient is a 60 y.o. female presenting with leg pain. The history is provided by the patient.  Leg Pain Location:  Leg Injury: no   Leg location:  L leg Pain details:    Radiates to:  Suprapubic region   Severity:  Mild   Onset quality:  Gradual   Timing:  Constant   Progression:  Unchanged Chronicity:  New Dislocation: no   Foreign body present:  No foreign bodies   Past Medical History  Diagnosis Date  . Hypothyroidism   . Fibromyalgia   . Complication of anesthesia     slow to wake up with tonsils and tubal ligation   . PONV (postoperative nausea and vomiting)   . GERD (gastroesophageal reflux disease)     occasional   . Arthritis   . Bilateral conjunctivitis     seen by PCP on 01/06/16, patient states Dr Alvan Dame aware    Past Surgical History  Procedure Laterality Date  . Eye surgery    . Carpal tunnel release    . Mandible surgery    . Shoulder surgery    . Root canal    . Tubal ligation    . Tonsillectomy    . Total hip arthroplasty Left 02/01/2016    Procedure: LEFT TOTAL HIP ARTHROPLASTY ANTERIOR APPROACH;  Surgeon: Paralee Cancel, MD;  Location: [facility];  Service: Orthopedics;  Laterality: Left;  Failed Spinal to LMA   Family History  Problem Relation Age of Onset  . Hypertension Mother   . Cancer Father     brain  . Cancer Other     colon  . Heart attack Other   . Cancer Other     throat   Social History  Substance Use  Topics  . Smoking status: Former Research scientist (life sciences)  . Smokeless tobacco: Never Used     Comment: quit smoking20 years ago   . Alcohol Use: Yes     Comment: rare   OB History    No data available     Review of Systems  Musculoskeletal:       Left leg pain and swelling  All other systems reviewed and are negative.     Allergies  Penicillins; Flexeril; and Soma  Home Medications   Prior to Admission medications   Medication Sig Start Date End Date Taking? Authorizing Provider  diazepam (VALIUM) 5 MG tablet Take 1 tablet (5 mg total) by mouth every 6 (six) hours as needed for muscle spasms. 02/02/16   Paralee Cancel, MD  HYDROcodone-acetaminophen (NORCO) 7.5-325 MG tablet Take 1-2 tablets by mouth every 4 (four) hours as needed (breakthrough pain). 02/02/16   Brad Dixon, PA-C  levothyroxine (SYNTHROID, LEVOTHROID) 100 MCG tablet Take 1 tablet (100 mcg total) by mouth daily. 03/02/15   Nilda Simmer, NP  loratadine (CLARITIN) 10 MG tablet Take 10 mg by mouth daily as needed for allergies.  Historical Provider, MD  Menthol (ICY HOT) 5 % PTCH Place 1 patch onto the skin as needed (For pain.).    Historical Provider, MD  methocarbamol (ROBAXIN) 500 MG tablet Take 1 tablet (500 mg total) by mouth every 6 (six) hours as needed for muscle spasms. 02/02/16   Brad Dixon, PA-C  mometasone (ELOCON) 0.1 % cream Apply to affected area BID prn Patient taking differently: Apply 1 application topically 2 (two) times daily as needed (Apply to affected area.).  10/02/15 10/01/16  [name], MD  Multiple Vitamin (MULTIVITAMIN WITH MINERALS) TABS tablet Take 1 tablet by mouth daily.    Historical Provider, MD  sulfacetamide (BLEPH-10) 10 % ophthalmic solution Place 1 drop into both eyes every 2 (two) hours while awake. Today; Then QID starting tomorrow 01/15/16   Nilda Simmer, NP   BP 128/67 mmHg  Pulse 94  Temp(Src) 98.7 F (37.1 C) (Oral)  Resp 18  Ht 5\' 4"  (1.626 m)  Wt 200 lb (90.719 kg)  BMI 34.31  kg/m2  SpO2 98% Physical Exam  Constitutional: She is oriented to person, place, and time. She appears well-developed and well-nourished.  HENT:  Head: Normocephalic and atraumatic.  Right Ear: External ear normal.  Left Ear: External ear normal.  Nose: Nose normal.  Mouth/Throat: Oropharynx is clear and moist.  Eyes: Conjunctivae are normal. Pupils are equal, round, and reactive to light.  Neck: Normal range of motion. Neck supple.  Cardiovascular: Normal rate, regular rhythm, normal heart sounds and intact distal pulses.   Pulmonary/Chest: Effort normal and breath sounds normal.  Abdominal: Soft. Bowel sounds are normal.  Musculoskeletal: Normal range of motion.  Both legs are in ted hose.  Left leg slightly swollen.  Tender to palpation of left calf.  Neurological: She is alert and oriented to person, place, and time.  Skin: Skin is warm and dry.  Psychiatric: She has a normal mood and affect. Her behavior is normal. Judgment and thought content normal.  Nursing note and vitals reviewed.   ED Course  Procedures (including critical care time) Labs Review Labs Reviewed - No data to display  Imaging Review No results found. I have personally reviewed and evaluated these images and lab results as part of my medical decision-making.   EKG Interpretation None      MDM  Unfortunately, [place] for DVT is unavailable tonight.  I will treat pt with lovenox and order an [place] for tomorrow morning.  Pt knows to return for sob or cp.  Pt knows to return if worse.  Final diagnoses:  Left leg pain  Suspected DVT (deep vein thrombosis), left (HCC)        Isla Pence, MD 02/14/16 2050

## 2016-02-15 ENCOUNTER — Ambulatory Visit (HOSPITAL_COMMUNITY)
Admission: RE | Admit: 2016-02-15 | Discharge: 2016-02-15 | Disposition: A | Discharge status: Home / Self Care | Payer: BLUE CROSS/BLUE SHIELD | Source: Ambulatory Visit | Attending: Emergency Medicine | Admitting: Emergency Medicine

## 2016-02-15 ENCOUNTER — Ambulatory Visit (HOSPITAL_COMMUNITY): Payer: BLUE CROSS/BLUE SHIELD

## 2016-02-15 DIAGNOSIS — M79605 Pain in left leg: Secondary | ICD-10-CM | POA: Diagnosis present

## 2016-02-15 DIAGNOSIS — M7989 Other specified soft tissue disorders: Secondary | ICD-10-CM | POA: Diagnosis not present

## 2016-03-02 ENCOUNTER — Ambulatory Visit (HOSPITAL_COMMUNITY): Payer: BLUE CROSS/BLUE SHIELD | Attending: Physician Assistant | Admitting: Physical Therapy

## 2016-03-02 DIAGNOSIS — M25552 Pain in left hip: Secondary | ICD-10-CM | POA: Insufficient documentation

## 2016-03-02 DIAGNOSIS — R2681 Unsteadiness on feet: Secondary | ICD-10-CM | POA: Diagnosis present

## 2016-03-02 DIAGNOSIS — M6281 Muscle weakness (generalized): Secondary | ICD-10-CM

## 2016-03-02 DIAGNOSIS — R262 Difficulty in walking, not elsewhere classified: Secondary | ICD-10-CM | POA: Diagnosis present

## 2016-03-02 NOTE — Therapy (Signed)
[place], [place], [address] Phone: [phone]   Fax:  [phone]  Physical Therapy Evaluation  Patient Details  Name: Wendy Marshall MRN: [id number] Date of Birth: 05/23/1956 Referring Provider: Gerrit Halls   Encounter Date: 03/02/2016      PT End of Session - 03/02/16 0904    Visit Number 1   Number of Visits 18   Date for PT Re-Evaluation 03/23/16   Authorization Type BC[employer]    Authorization Time Period 03/02/16 to 04/13/16   PT Start Time 0820   PT Stop Time 0856   PT Time Calculation (min) 36 min   Activity Tolerance Patient tolerated treatment well   Behavior During Therapy [facility] for tasks assessed/performed      Past Medical History:  Diagnosis Date  . Arthritis   . Bilateral conjunctivitis    seen by PCP on 01/06/16, patient states Dr Alvan Dame aware   . Complication of anesthesia    slow to wake up with tonsils and tubal ligation   . Fibromyalgia   . GERD (gastroesophageal reflux disease)    occasional   . Hypothyroidism   . PONV (postoperative nausea and vomiting)     Past Surgical History:  Procedure Laterality Date  . CARPAL TUNNEL RELEASE    . EYE SURGERY    . MANDIBLE SURGERY    . ROOT CANAL    . SHOULDER SURGERY    . TONSILLECTOMY    . TOTAL HIP ARTHROPLASTY Left 02/01/2016   Procedure: LEFT TOTAL HIP ARTHROPLASTY ANTERIOR APPROACH;  Surgeon: Paralee Cancel, MD;  Location: [facility];  Service: Orthopedics;  Laterality: Left;  Failed Spinal to LMA  . TUBAL LIGATION      There were no vitals filed for this visit.       Subjective Assessment - 03/02/16 [id number]    Subjective Patient arrives stating that she had anterior hip surgery on July 3rd on her L LE; she thinks everything went OK after surgery. There was some concern of a blood clot at one point and she went to the MD, but testing for DVT was negative. She does not have to do a lot of stairs, and as long as she uses the cane she is OK. Her  hip feels weak in general. After surgery she had ITB issues but the HHPT pretty much resolved this; when she sits down she feels like she is sitting on something in her hip.    Pertinent History hypothyroidism, fibromyalgia, hx of LBP, hx of cervical fusion, hx of L shoulder surgery    How long can you sit comfortably? limited on hard surfaces, unlimited on soft surfaces    How long can you stand comfortably? 5 minutes    How long can you walk comfortably? 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 Yes   Pain Score 3    Pain Location Hip   Pain Orientation Left   Pain Descriptors / Indicators Aching;Burning;Numbness   Pain Type Surgical pain   Pain Radiating Towards can run down ITB area sometimes    Pain Onset 1 to 4 weeks ago   Pain Frequency Constant   Aggravating Factors  weight bearing a lot    Pain Relieving Factors ice, elevating LEs in recliner    Effect of Pain on Daily Activities cannot get back to work right now  [facility] PT Assessment - 03/02/16 0001      Assessment   Medical Diagnosis L anterior hip replacement    Referring Provider Gerrit Halls    Onset Date/Surgical Date 02/01/16   Next MD Visit [place] (PA-C) on the 9th      Precautions   Precaution Comments anterior hip precautions L      Balance Screen   Has the patient fallen in the past 6 months No   Has the patient had a decrease in activity level because of a fear of falling?  Yes   Is the patient reluctant to leave their home because of a fear of falling?  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    Leisure none      Strength   Right Hip Flexion 4-/5   Right Hip Extension 4-/5  estimated from functional bridge     Right Hip ABduction --  DNT, unable to tolerate laying on L hip    Left Hip Flexion 3-/5   Left Hip Extension 4-/5  estimated from functional bridge    Left Hip ABduction 3-/5   Right Knee Flexion 5/5   Right Knee Extension 5/5   Left Knee Flexion 4+/5   Left Knee Extension 4/5   Right Ankle Dorsiflexion 5/5   Left Ankle Dorsiflexion 4+/5     Transfers   Five time sit to stand comments  21.42 seconds      Ambulation/Gait   Gait Comments reduced weight bearing L LE, reduced stance time L LE, proximal muscle weakness      6 minute walk test results    Aerobic Endurance Distance Walked 378   Endurance additional comments 3MWT, cane      Timed Up and Go Test   Normal TUG (seconds) 16.21   TUG Comments cane      High Level Balance   High Level Balance Comments SLS 30+ R LE, 18 seconds intermittent UE touch L LE                            PT Education - 03/02/16 0903    Education provided Yes   Education Details prognosis, HEP adjustments, POC; regular walking and OK to bike within anterior hip precautions    Person(s) Educated Patient   Methods Explanation   Comprehension Verbalized understanding;Need further instruction          PT Short Term Goals - 03/02/16 0908      PT SHORT TERM GOAL #1   Title Patient to demonstrate equal weight bearing through B LEs with all standing and gait based activities to show general improvement of mobilty and condition    Time 3   Period Weeks   Status New     PT SHORT TERM GOAL #2   Title Patient to be able to perform TUG in 10 seconds or less with no AD in order to show improved mobilty and balance    Time 3   Period Weeks   Status New     PT SHORT TERM GOAL #3   Title Patient to be able to perform 5x sit to stand in 10 seconds in order to demonstrate improved mobility and strength    Time 3   Period Weeks   Status  New     PT SHORT TERM GOAL #4   Title Patient to be independent in correctly and  consistetly performing appropriate HEP, to be updated PRN    Time 3   Period Weeks   Status New           PT Long Term Goals - 03/02/16 0909      PT LONG TERM GOAL #1   Title Patient to demonstrate strength 5/5 in all tested muscle groups in order to reduce pain, improved activity tolerance, and facilitate return to work    Time 6   Period Weeks   Status New     PT LONG TERM GOAL #2   Title Patient to experience pain L LE no more than 1/10 in order to improve QOL and facilitate return to work    Time 6   Period Weeks   Status New     PT LONG TERM GOAL #3   Title Patient to demonstrate correct mechanics for functional lifting with no pain exacerbation in order to allow her to return to work safely    Time 6   Period Weeks   Status New     PT LONG TERM GOAL #4   Title Patient to be participatory in regular exercise program, at least 3 days per week, at least 30 minutes in duration, in order to maintain functional gains and assist in improving overall health status    Time 6   [place] - 03/02/16 0906    Clinical Impression Statement Patient arrives after having L anterior hip surgery on 02/01/16 by Dr. Alvan Dame; she reports there was at one time a bit of a scare regarding a possible DVT however all tests came back negative and she has not had the same symptoms since. She is doing well overall, keeping up with HHPT HEP but reports her hip continues to feel weak. Upon examination, patient reveals impaired gait mechanics and tolerance, functional strength limitations, unsteadiness, and reduced functional activity tolerance, however scar appears to be healing well. Educated patient regarding general anterior hip precautions, also educated that she is safe to perform bicycle riding, to walk more regularly with equal weight bearing through LEs, and to try adding ankle weights to HHPT HEP for strengthening rather than just doing a large number of  reps. At this time patient will benefit from skilled PT services in order to address functional limitations and assist in reaching optimal level of function.    Rehab Potential Good   PT Frequency 3x / week   PT Duration 6 weeks   PT Treatment/Interventions ADLs/Self Care Home Management;Cryotherapy;Moist Heat;DME Instruction;Gait training;Stair training;Functional mobility training;Therapeutic activities;Therapeutic exercise;Balance training;Neuromuscular re-education;Patient/family education;Manual techniques;Scar mobilization;Passive range of motion;Energy conservation;Taping   PT Next Visit Plan see if patient found ankle weights/if she needs therband for HEP; functional strength and balance focus, also incorporate functional activity tolerance training    PT Home Exercise Plan HHPT HEP for now, advised to progress to strength version    Consulted and Agree with Plan of Care Patient      Patient will benefit from skilled therapeutic intervention in order to improve the following deficits and impairments:  Abnormal gait, Improper body mechanics, Pain, Decreased coordination, Decreased mobility, Decreased scar mobility, Increased muscle spasms, Postural dysfunction, Decreased activity tolerance, Decreased strength, Decreased balance, Decreased safety awareness, Difficulty walking, Impaired flexibility  Visit Diagnosis: Pain in  left hip - Plan: PT plan of care cert/re-cert  Muscle weakness (generalized) - Plan: PT plan of care cert/re-cert  Difficulty in walking, not elsewhere classified - Plan: PT plan of care cert/re-cert  Unsteadiness on feet - Plan: PT plan of care cert/re-cert     Problem List Patient Active Problem List   Diagnosis Date Noted  . Status post total replacement of left hip 02/01/2016  . Left-sided low back pain with left-sided sciatica 08/06/2015  . Hypothyroidism 08/05/2013  . Fibromyalgia 08/05/2013    Deniece Ree PT, DPT [place] [address] [place], [place], [address] Phone: [phone]   Fax:  [phone]  Name: Wendy Marshall MRN: [id number] Date of Birth: 15-Jun-1956

## 2016-03-04 ENCOUNTER — Other Ambulatory Visit: Payer: Self-pay | Admitting: Nurse Practitioner

## 2016-03-04 ENCOUNTER — Ambulatory Visit (HOSPITAL_COMMUNITY): Payer: BLUE CROSS/BLUE SHIELD

## 2016-03-04 DIAGNOSIS — R2681 Unsteadiness on feet: Secondary | ICD-10-CM

## 2016-03-04 DIAGNOSIS — M6281 Muscle weakness (generalized): Secondary | ICD-10-CM

## 2016-03-04 DIAGNOSIS — M25552 Pain in left hip: Secondary | ICD-10-CM | POA: Diagnosis not present

## 2016-03-04 DIAGNOSIS — R262 Difficulty in walking, not elsewhere classified: Secondary | ICD-10-CM

## 2016-03-04 NOTE — Patient Instructions (Addendum)
Bridge    Lie back, legs bent. Inhale, pressing hips up. Keeping ribs in, lengthen lower back. Exhale, rolling down along spine from top. Repeat 10 times. Do 2 sessions per day.  http://pm.exer.us/55   Copyright  VHI. All rights reserved.   Functional Quadriceps: Sit to Stand    Sit on edge of chair, feet flat on floor. Stand upright, extending knees fully. Repeat 10 times per set. Do 2 sets per session. Do 3-5 sessions per week.  http://orth.exer.us/735   Copyright  VHI. All rights reserved.   Abduction    Slide one leg out to side. Keep kneecap pointing up. Gently bring leg back to pillow. Repeat with other leg. Repeat 10 times. Do 2 sessions per day.  http://[email or web address].exer.us/374   Copyright  VHI. All rights reserved.

## 2016-03-04 NOTE — Therapy (Signed)
[place], [place], [address] Phone: [phone]   Fax:  [phone]  Physical Therapy Treatment  Patient Details  Name: Wendy Marshall MRN: [id number] Date of Birth: [birth year] Referring Provider: Gerrit Halls   Encounter Date: 03/04/2016      PT End of Session - 03/04/16 1523    Visit Number 2   Number of Visits 18   Date for PT Re-Evaluation 03/23/16   Authorization Type BCBS    Authorization Time Period 03/02/16 to 04/13/16   PT Start Time 1518   PT Stop Time 1603   PT Time Calculation (min) 45 min   Activity Tolerance Patient tolerated treatment well   Behavior During Therapy [facility] for tasks assessed/performed      Past Medical History:  Diagnosis Date  . Arthritis   . Bilateral conjunctivitis    seen by PCP on 01/06/16, patient states Dr Alvan Dame aware   . Complication of anesthesia    slow to wake up with tonsils and tubal ligation   . Fibromyalgia   . GERD (gastroesophageal reflux disease)    occasional   . Hypothyroidism   . PONV (postoperative nausea and vomiting)     Past Surgical History:  Procedure Laterality Date  . CARPAL TUNNEL RELEASE    . EYE SURGERY    . MANDIBLE SURGERY    . ROOT CANAL    . SHOULDER SURGERY    . TONSILLECTOMY    . TOTAL HIP ARTHROPLASTY Left 02/01/2016   Procedure: LEFT TOTAL HIP ARTHROPLASTY ANTERIOR APPROACH;  Surgeon: Paralee Cancel, MD;  Location: [facility];  Service: Orthopedics;  Laterality: Left;  Failed Spinal to LMA  . TUBAL LIGATION      There were no vitals filed for this visit.      Subjective Assessment - 03/04/16 1520    Subjective Pt stated she her hip and both knees are stiff and achey today, current pain scale 3/10.  Has been compliant with HHPT HEP daily.  Has began walking daily.   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 Yes   Pain Score 3    Pain Location Hip   Pain Orientation Left;Lateral;Posterior   Pain Descriptors / Indicators Aching;Sore  Burning on lateral aspect   Pain Type Surgical pain   Pain Radiating Towards can run down ITB area sometimes   Pain Onset 1 to 4 weeks ago   Pain Frequency Constant   Aggravating Factors  weight bearing a lot   Pain Relieving Factors ice, elecvating LEs in recliner   Effect of Pain on Daily Activities cannot get back to work right now              [facility] Adult PT Treatment/Exercise - 03/04/16 0001      Exercises   Exercises Knee/Hip     Knee/Hip Exercises: Standing   Heel Raises 20 reps   Heel Raises Limitations Heel and toe raises   Functional Squat 15 reps   Functional Squat Limitations mini squats   Rocker Board 2 minutes   Rocker Board Limitations A/P and lateral   Gait Training Gait training with SPC x 452 ft with cueing for equal stance phase, stride length and heel to toe pattern CGA   Other Standing Knee Exercises Weight shifting frontal plane  Knee/Hip Exercises: Supine   Quad Sets Limitations incorporated with heel slides   Heel Slides Left;15 reps   Hip Adduction Isometric 10 reps   Hip Adduction Isometric Limitations abudction supine   Bridges 2 sets;10 reps     Knee/Hip Exercises: Sidelying   Clams 10x Lt LE unable to tolerate laying on Lt side                PT Education - 03/04/16 1631    Education provided Yes   Education Details Reviewed goals, complaince and assured correct technqiue with HEP, established additional HEP; Copy of eval given to pt; Reviewed anterior hip precautions; pain and edema control strategies   Person(s) Educated Patient   Methods Explanation;Demonstration;Handout   Comprehension Verbalized understanding;Returned demonstration          PT Short Term Goals - 03/02/16 0908      PT SHORT TERM GOAL #1   Title Patient to demonstrate equal weight  bearing through B LEs with all standing and gait based activities to show general improvement of mobilty and condition    Time 3   Period Weeks   Status New     PT SHORT TERM GOAL #2   Title Patient to be able to perform TUG in 10 seconds or less with no AD in order to show improved mobilty and balance    Time 3   Period Weeks   Status New     PT SHORT TERM GOAL #3   Title Patient to be able to perform 5x sit to stand in 10 seconds in order to demonstrate improved mobility and strength    Time 3   Period Weeks   Status New     PT SHORT TERM GOAL #4   Title Patient to be independent in correctly and consistetly performing appropriate HEP, to be updated PRN    Time 3   Period Weeks   Status New           PT Long Term Goals - 03/02/16 0909      PT LONG TERM GOAL #1   Title Patient to demonstrate strength 5/5 in all tested muscle groups in order to reduce pain, improved activity tolerance, and facilitate return to work    Time 6   Period Weeks   Status New     PT LONG TERM GOAL #2   Title Patient to experience pain L LE no more than 1/10 in order to improve QOL and facilitate return to work    Time 6   Period Weeks   Status New     PT LONG TERM GOAL #3   Title Patient to demonstrate correct mechanics for functional lifting with no pain exacerbation in order to allow her to return to work safely    Time 6   Period Weeks   Status New     PT LONG TERM GOAL #4   Title Patient to be participatory in regular exercise program, at least 3 days per week, at least 30 minutes in duration, in order to maintain functional gains and assist in improving overall health status    Time 6   [place] - 03/04/16 1606    Clinical Impression Statement Reviewed goals, reviewed HHPT HEP with additional exercises given and copy of evaluation given to pt.  Reviewed precautions with anterior hip replacement, pt able to verbalize appropriate  precautions.  Session focus on improving weight distribution with gait and functional strengthening exercises.  Pt able to demonstrate approriate form following demonstration with min verbal and tactile cueing for form and technique.  EOS pt reports slight increase in pain, plans to apply ice with elevation for pain control.  Pt was given red theraband to add resistance with HEP exericses.     Rehab Potential Good   PT Frequency 3x / week   PT Duration 6 weeks   PT Treatment/Interventions ADLs/Self Care Home Management;Cryotherapy;Moist Heat;DME Instruction;Gait training;Stair training;Functional mobility training;Therapeutic activities;Therapeutic exercise;Balance training;Neuromuscular re-education;Patient/family education;Manual techniques;Scar mobilization;Passive range of motion;Energy conservation;Taping   PT Next Visit Plan Continue focus with functional strength and balance focus, also incorporate functional activity tolerance training    PT Home Exercise Plan Reviewed form and technqiues wiht HHPT HEP, additional exercises given.        Patient will benefit from skilled therapeutic intervention in order to improve the following deficits and impairments:  Abnormal gait, Improper body mechanics, Pain, Decreased coordination, Decreased mobility, Decreased scar mobility, Increased muscle spasms, Postural dysfunction, Decreased activity tolerance, Decreased strength, Decreased balance, Decreased safety awareness, Difficulty walking, Impaired flexibility  Visit Diagnosis: Pain in left hip  Muscle weakness (generalized)  Difficulty in walking, not elsewhere classified  Unsteadiness on feet     Problem List Patient Active Problem List   Diagnosis Date Noted  . Status post total replacement of left hip 02/01/2016  . Left-sided low back pain with left-sided sciatica 08/06/2015  . Hypothyroidism 08/05/2013  . Fibromyalgia 08/05/2013   Ihor Austin, LPTA;  [place]  Aldona Lento 03/04/2016, 4:36 PM  [place] [address] [place], [place], [address] Phone: [phone]   Fax:  [phone]  Name: CAFFIE GONSALEZ MRN: [id number] Date of Birth: Dec 16, 1955

## 2016-03-08 ENCOUNTER — Ambulatory Visit (HOSPITAL_COMMUNITY): Payer: BLUE CROSS/BLUE SHIELD

## 2016-03-08 DIAGNOSIS — R262 Difficulty in walking, not elsewhere classified: Secondary | ICD-10-CM

## 2016-03-08 DIAGNOSIS — R2681 Unsteadiness on feet: Secondary | ICD-10-CM

## 2016-03-08 DIAGNOSIS — M25552 Pain in left hip: Secondary | ICD-10-CM | POA: Diagnosis not present

## 2016-03-08 DIAGNOSIS — M6281 Muscle weakness (generalized): Secondary | ICD-10-CM

## 2016-03-08 NOTE — Patient Instructions (Signed)
Hamstring Stretch    With other leg bent, foot flat, grasp right leg and slowly try to straighten knee. Hold 30 seconds. Repeat 3 times. Do 1-2 sessions per day.  http://gt2.exer.us/280   Copyright  VHI. All rights reserved.   Calf Stretch    With towel around forefoot, keep knee straight and pull back on towel until a stretch is felt in the calf. Hold 30 seconds. Repeat 3 times. Do 1-2 sessions per day.  Copyright  VHI. All rights reserved.

## 2016-03-08 NOTE — Therapy (Signed)
[place], [place], [address] Phone: [phone]   Fax:  [phone]  Physical Therapy Treatment  Patient Details  Name: Wendy Marshall MRN: [id number] Date of Birth: [birth year] Referring Provider: Gerrit Halls   Encounter Date: 03/08/2016      PT End of Session - 03/08/16 1616    Visit Number 3   Number of Visits 18   Date for PT Re-Evaluation 03/23/16   Authorization Type BCBS    Authorization Time Period 03/02/16 to 04/13/16   PT Start Time 1606   PT Stop Time 1648   PT Time Calculation (min) 42 min   Activity Tolerance Patient tolerated treatment well   Behavior During Therapy [facility] for tasks assessed/performed      Past Medical History:  Diagnosis Date  . Arthritis   . Bilateral conjunctivitis    seen by PCP on 01/06/16, patient states Dr Alvan Dame aware   . Complication of anesthesia    slow to wake up with tonsils and tubal ligation   . Fibromyalgia   . GERD (gastroesophageal reflux disease)    occasional   . Hypothyroidism   . PONV (postoperative nausea and vomiting)     Past Surgical History:  Procedure Laterality Date  . CARPAL TUNNEL RELEASE    . EYE SURGERY    . MANDIBLE SURGERY    . ROOT CANAL    . SHOULDER SURGERY    . TONSILLECTOMY    . TOTAL HIP ARTHROPLASTY Left 02/01/2016   Procedure: LEFT TOTAL HIP ARTHROPLASTY ANTERIOR APPROACH;  Surgeon: Paralee Cancel, MD;  Location: [facility];  Service: Orthopedics;  Laterality: Left;  Failed Spinal to LMA  . TUBAL LIGATION      There were no vitals filed for this visit.      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 Yes   Pain Score 3    Pain Location Leg   Pain Orientation Left;Lateral;Distal   Pain Descriptors / Indicators Aching;Sore   Pain Type Surgical pain   Pain Radiating Towards can run down ITB area sometimes   Pain Onset 1 to 4 weeks ago   Pain Frequency Constant   Aggravating Factors  weight bearing a lot   Pain Relieving Factors ice, elevation LE   Effect of Pain on Daily Activities unable to RTW                         [facility] Adult PT Treatment/Exercise - 03/08/16 0001      Knee/Hip Exercises: Stretches   Active Hamstring Stretch Both;3 reps;30 seconds   Active Hamstring Stretch Limitations supine with rope   Quad Stretch 3 reps;30 seconds   Quad Stretch Limitations prone with rope   Gastroc Stretch 3 reps;30 seconds   Gastroc Stretch Limitations long sit stretch and slant board     Knee/Hip Exercises: Standing   Heel Raises 10 reps   Heel Raises Limitations Heel and toe raises   Hip Abduction Both;10 reps   Other Standing Knee Exercises Weight shifting frontal plane  Knee/Hip Exercises: Seated   Sit to Sand 5 reps;without UE support     Knee/Hip Exercises: Supine   Bridges 20 reps   Other Supine Knee/Hip Exercises rolling Lt to Rt to improve tolerance laying on Lt hip                  PT Short Term Goals - 03/02/16 0908      PT SHORT TERM GOAL #1   Title Patient to demonstrate equal weight bearing through B LEs with all standing and gait based activities to show general improvement of mobilty and condition    Time 3   Period Weeks   Status New     PT SHORT TERM GOAL #2   Title Patient to be able to perform TUG in 10 seconds or less with no AD in order to show improved mobilty and balance    Time 3   Period Weeks   Status New     PT SHORT TERM GOAL #3   Title Patient to be able to perform 5x sit to stand in 10 seconds in order to demonstrate improved mobility and strength    Time 3   Period  Weeks   Status New     PT SHORT TERM GOAL #4   Title Patient to be independent in correctly and consistetly performing appropriate HEP, to be updated PRN    Time 3   Period Weeks   Status New           PT Long Term Goals - 03/02/16 0909      PT LONG TERM GOAL #1   Title Patient to demonstrate strength 5/5 in all tested muscle groups in order to reduce pain, improved activity tolerance, and facilitate return to work    Time 6   Period Weeks   Status New     PT LONG TERM GOAL #2   Title Patient to experience pain L LE no more than 1/10 in order to improve QOL and facilitate return to work    Time 6   Period Weeks   Status New     PT LONG TERM GOAL #3   Title Patient to demonstrate correct mechanics for functional lifting with no pain exacerbation in order to allow her to return to work safely    Time 6   Period Weeks   Status New     PT LONG TERM GOAL #4   Title Patient to be participatory in regular exercise program, at least 3 days per week, at least 30 minutes in duration, in order to maintain functional gains and assist in improving overall health status    Time 6   Period Weeks   Status New               Plan - 03/08/16 1627    Clinical Impression Statement Pt limited by pain/soreness following last session.  Incoporated stretches this session to assist with soreness and improve flexibility for pain control.  Educated pt on techqniues to improve tolerance with Lt sidelying weight bearing.  Pt able to complete all exercises with min cueing for form and technqiue.  No reports of increased pain through session.     Rehab Potential Good   PT Frequency 3x / week   PT Duration 6 weeks   PT Treatment/Interventions ADLs/Self Care Home Management;Cryotherapy;Moist Heat;DME Instruction;Gait training;Stair training;Functional mobility training;Therapeutic activities;Therapeutic exercise;Balance training;Neuromuscular re-education;Patient/family education;Manual  techniques;Scar mobilization;Passive range of motion;Energy conservation;Taping   PT Next Visit Plan Continue  focus with functional strength and balance focus, also incorporate functional activity tolerance training    PT Home Exercise Plan Instructed LE stretches to complete at home following anterior hip replacement precautions      Patient will benefit from skilled therapeutic intervention in order to improve the following deficits and impairments:  Abnormal gait, Improper body mechanics, Pain, Decreased coordination, Decreased mobility, Decreased scar mobility, Increased muscle spasms, Postural dysfunction, Decreased activity tolerance, Decreased strength, Decreased balance, Decreased safety awareness, Difficulty walking, Impaired flexibility  Visit Diagnosis: Pain in left hip  Muscle weakness (generalized)  Difficulty in walking, not elsewhere classified  Unsteadiness on feet     Problem List Patient Active Problem List   Diagnosis Date Noted  . Status post total replacement of left hip 02/01/2016  . Left-sided low back pain with left-sided sciatica 08/06/2015  . Hypothyroidism 08/05/2013  . Fibromyalgia 08/05/2013   Ihor Austin, LPTA; [place]  Aldona Lento 03/08/2016, 6:06 PM  [place] [address] [place], [place], [address] Phone: [phone]   Fax:  [phone]  Name: Wendy Marshall MRN: [id number] Date of Birth: 1955-09-06

## 2016-03-09 ENCOUNTER — Emergency Department (HOSPITAL_COMMUNITY): Payer: BLUE CROSS/BLUE SHIELD

## 2016-03-09 ENCOUNTER — Other Ambulatory Visit: Payer: Self-pay

## 2016-03-09 ENCOUNTER — Encounter (HOSPITAL_COMMUNITY): Payer: Self-pay | Admitting: *Deleted

## 2016-03-09 ENCOUNTER — Other Ambulatory Visit (HOSPITAL_COMMUNITY): Payer: Self-pay

## 2016-03-09 ENCOUNTER — Inpatient Hospital Stay (HOSPITAL_COMMUNITY)
Admission: EM | Admit: 2016-03-09 | Discharge: 2016-03-11 | DRG: 176 | Disposition: A | Discharge status: Home / Self Care | Payer: BLUE CROSS/BLUE SHIELD | Attending: Internal Medicine | Admitting: Internal Medicine

## 2016-03-09 DIAGNOSIS — E039 Hypothyroidism, unspecified: Secondary | ICD-10-CM | POA: Diagnosis present

## 2016-03-09 DIAGNOSIS — M797 Fibromyalgia: Secondary | ICD-10-CM | POA: Diagnosis present

## 2016-03-09 DIAGNOSIS — R091 Pleurisy: Secondary | ICD-10-CM | POA: Diagnosis present

## 2016-03-09 DIAGNOSIS — Z96649 Presence of unspecified artificial hip joint: Secondary | ICD-10-CM | POA: Diagnosis not present

## 2016-03-09 DIAGNOSIS — D72829 Elevated white blood cell count, unspecified: Secondary | ICD-10-CM | POA: Diagnosis present

## 2016-03-09 DIAGNOSIS — I2699 Other pulmonary embolism without acute cor pulmonale: Secondary | ICD-10-CM | POA: Diagnosis not present

## 2016-03-09 DIAGNOSIS — K219 Gastro-esophageal reflux disease without esophagitis: Secondary | ICD-10-CM | POA: Diagnosis present

## 2016-03-09 DIAGNOSIS — R0602 Shortness of breath: Secondary | ICD-10-CM | POA: Diagnosis not present

## 2016-03-09 DIAGNOSIS — Z6834 Body mass index (BMI) 34.0-34.9, adult: Secondary | ICD-10-CM

## 2016-03-09 DIAGNOSIS — Z87891 Personal history of nicotine dependence: Secondary | ICD-10-CM

## 2016-03-09 DIAGNOSIS — R509 Fever, unspecified: Secondary | ICD-10-CM | POA: Diagnosis present

## 2016-03-09 DIAGNOSIS — E669 Obesity, unspecified: Secondary | ICD-10-CM | POA: Diagnosis present

## 2016-03-09 DIAGNOSIS — Z96642 Presence of left artificial hip joint: Secondary | ICD-10-CM

## 2016-03-09 DIAGNOSIS — M7989 Other specified soft tissue disorders: Secondary | ICD-10-CM

## 2016-03-09 DIAGNOSIS — Z86711 Personal history of pulmonary embolism: Secondary | ICD-10-CM

## 2016-03-09 DIAGNOSIS — Z981 Arthrodesis status: Secondary | ICD-10-CM

## 2016-03-09 DIAGNOSIS — Z79899 Other long term (current) drug therapy: Secondary | ICD-10-CM

## 2016-03-09 DIAGNOSIS — Z8249 Family history of ischemic heart disease and other diseases of the circulatory system: Secondary | ICD-10-CM

## 2016-03-09 HISTORY — DX: Personal history of pulmonary embolism: Z86.711

## 2016-03-09 LAB — BASIC METABOLIC PANEL
Anion gap: 8 (ref 5–15)
BUN: 8 mg/dL (ref 6–20)
CO2: 25 mmol/L (ref 22–32)
Calcium: 9.2 mg/dL (ref 8.9–10.3)
Chloride: 103 mmol/L (ref 101–111)
Creatinine, Ser: 0.89 mg/dL (ref 0.44–1.00)
GFR calc Af Amer: >60 mL/min (ref >60)
GFR calc non Af Amer: >60 mL/min (ref >60)
Glucose, Bld: 117 mg/dL — ABNORMAL HIGH (ref 65–99)
Potassium: 3.8 mmol/L (ref 3.5–5.1)
Sodium: 136 mmol/L (ref 135–145)

## 2016-03-09 LAB — CBC
HCT: 39.4 % (ref 36.0–46.0)
Hemoglobin: 12.5 g/dL (ref 12.0–15.0)
MCH: 25.6 pg — ABNORMAL LOW (ref 26.0–34.0)
MCHC: 31.7 g/dL (ref 30.0–36.0)
MCV: 80.7 fL (ref 78.0–100.0)
Platelets: 330 10*3/uL (ref 150–400)
RBC: 4.88 MIL/uL (ref 3.87–5.11)
RDW: 13.8 % (ref 11.5–15.5)
WBC: 14.7 10*3/uL — ABNORMAL HIGH (ref 4.0–10.5)

## 2016-03-09 LAB — PROTIME-INR
INR: 1.07
Prothrombin Time: 13.9 seconds (ref 11.4–15.2)

## 2016-03-09 LAB — I-STAT TROPONIN, ED: Troponin i, poc: 0 ng/mL (ref 0.00–0.08)

## 2016-03-09 LAB — APTT: aPTT: 34 seconds (ref 24–36)

## 2016-03-09 MED ORDER — IOPAMIDOL (ISOVUE-370) INJECTION 76%
100.0000 mL | Freq: Once | INTRAVENOUS | Status: AC | PRN
  Administered 2016-03-09: 100 mL via INTRAVENOUS

## 2016-03-09 MED ORDER — SODIUM CHLORIDE 0.9% FLUSH
3.0000 mL | Freq: Two times a day (BID) | INTRAVENOUS | Status: DC
  Administered 2016-03-09 – 2016-03-10 (×3): 3 mL via INTRAVENOUS

## 2016-03-09 MED ORDER — MORPHINE SULFATE (PF) 4 MG/ML IV SOLN
4.0000 mg | Freq: Once | INTRAVENOUS | Status: AC
  Administered 2016-03-09: 4 mg via INTRAVENOUS
  Filled 2016-03-09: qty 1

## 2016-03-09 MED ORDER — SODIUM CHLORIDE 0.9 % IV SOLN
INTRAVENOUS | Status: DC
  Administered 2016-03-09 – 2016-03-11 (×3): via INTRAVENOUS

## 2016-03-09 MED ORDER — ONDANSETRON HCL 4 MG PO TABS: 4.0000 mg | ORAL_TABLET | Freq: Four times a day (QID) | ORAL | Status: DC | PRN

## 2016-03-09 MED ORDER — ONDANSETRON HCL 4 MG/2ML IJ SOLN: 4.0000 mg | Freq: Four times a day (QID) | INTRAMUSCULAR | Status: DC | PRN

## 2016-03-09 MED ORDER — ADULT MULTIVITAMIN W/MINERALS CH
1.0000 | ORAL_TABLET | Freq: Every day | ORAL | Status: DC
  Administered 2016-03-10 – 2016-03-11 (×2): 1 via ORAL
  Filled 2016-03-09 (×2): qty 1

## 2016-03-09 MED ORDER — ACETAMINOPHEN 650 MG RE SUPP: 650.0000 mg | Freq: Four times a day (QID) | RECTAL | Status: DC | PRN

## 2016-03-09 MED ORDER — HYDROCODONE-ACETAMINOPHEN 5-325 MG PO TABS
1.0000 | ORAL_TABLET | ORAL | Status: DC | PRN
  Administered 2016-03-10 – 2016-03-11 (×7): 1 via ORAL
  Filled 2016-03-09 (×7): qty 1

## 2016-03-09 MED ORDER — LORATADINE 10 MG PO TABS: 10.0000 mg | ORAL_TABLET | Freq: Every day | ORAL | Status: DC | PRN

## 2016-03-09 MED ORDER — MENTHOL (TOPICAL ANALGESIC) 5 % EX PTCH: 1.0000 | MEDICATED_PATCH | CUTANEOUS | Status: DC | PRN

## 2016-03-09 MED ORDER — HEPARIN BOLUS VIA INFUSION
2000.0000 [IU] | Freq: Once | INTRAVENOUS | Status: AC
  Administered 2016-03-09: 2000 [IU] via INTRAVENOUS
  Filled 2016-03-09: qty 2000

## 2016-03-09 MED ORDER — MORPHINE SULFATE (PF) 2 MG/ML IV SOLN
2.0000 mg | INTRAVENOUS | Status: DC | PRN
  Administered 2016-03-09 – 2016-03-10 (×2): 2 mg via INTRAVENOUS
  Filled 2016-03-09 (×2): qty 1

## 2016-03-09 MED ORDER — ACETAMINOPHEN 325 MG PO TABS
650.0000 mg | ORAL_TABLET | Freq: Four times a day (QID) | ORAL | Status: DC | PRN
  Administered 2016-03-11: 650 mg via ORAL
  Filled 2016-03-09: qty 2

## 2016-03-09 MED ORDER — SODIUM CHLORIDE 0.9 % IV BOLUS (SEPSIS)
1000.0000 mL | Freq: Once | INTRAVENOUS | Status: AC
  Administered 2016-03-09: 1000 mL via INTRAVENOUS

## 2016-03-09 MED ORDER — LEVALBUTEROL HCL 0.63 MG/3ML IN NEBU: 0.6300 mg | INHALATION_SOLUTION | Freq: Four times a day (QID) | RESPIRATORY_TRACT | Status: DC | PRN

## 2016-03-09 MED ORDER — HEPARIN (PORCINE) IN NACL 100-0.45 UNIT/ML-% IJ SOLN
1250.0000 [IU]/h | INTRAMUSCULAR | Status: DC
  Administered 2016-03-09: 1250 [IU]/h via INTRAVENOUS
  Filled 2016-03-09: qty 250

## 2016-03-09 MED ORDER — MUSCLE RUB 10-15 % EX CREA
TOPICAL_CREAM | CUTANEOUS | Status: DC | PRN
  Filled 2016-03-09: qty 85

## 2016-03-09 MED ORDER — DIAZEPAM 5 MG PO TABS
5.0000 mg | ORAL_TABLET | Freq: Four times a day (QID) | ORAL | Status: DC | PRN
  Administered 2016-03-10 (×3): 5 mg via ORAL
  Filled 2016-03-09 (×3): qty 1

## 2016-03-09 MED ORDER — LEVOTHYROXINE SODIUM 100 MCG PO TABS
100.0000 ug | ORAL_TABLET | Freq: Every day | ORAL | Status: DC
Start: 2016-03-10 — End: 2016-03-11
  Administered 2016-03-10 – 2016-03-11 (×2): 100 ug via ORAL
  Filled 2016-03-09 (×3): qty 1

## 2016-03-09 NOTE — ED Triage Notes (Signed)
Pt reports R side and cp with SOB this am.  States pain is worse when taking a deep breath.

## 2016-03-09 NOTE — H&P (Addendum)
History and Physical    Wendy Marshall [id number] DOB: [birth year] DOA: 03/09/2016  Referring MD/NP/PA:   PCP: Sallee Lange, MD   Patient coming from:  The patient is coming from home.  At baseline, pt is independent for most of ADL.   Chief Complaint: Chest pain, shortness of breath, left leg swelling  HPI: Wendy Marshall is a 60 y.o. female with medical history significant of s/p of recent left hip replacement on 02/01/16, GERD, hypothyroidism, fibromyalgia, arthritis, conjunctivitis, who presents with chest pain, shortness of breath and left leg swelling.  Patient states that had left hip replacement surgery on 02/01/16. She is recovering swelling from surgery. She states that she developed left leg swelling and pain after the surgery. She had lower extremity venous Doppler on 02/05/16, which was negative for DVT. Her left leg swelling has improved, currently has mild tenderness in calf area. Patient states that she started having chest pain or shortness of breath since this morning. The chest pain is located in right side of chest, also in right flank area. It is constant, sharp, 7 out of 10 in severity, nonradiating. It is pleuritic, aggravated by deep breath. Patient does not have cough. She has chills and mild fever with temperature 100.1 at home. Patient does not have nausea, vomiting, abdominal pain, diarrhea, symptoms of UTI. No unilateral weakness. Patient does not have family history of blood clot.  ED Course: pt was found to have WBC 14.7, negative troponin, INR 1.07, PTT 34, electrolytes and renal function okay, chest x-ray showed no lung volume. Patient is placed on teddy benefit observation.  # CTA of chest showed bilateral pulmonary emboli, involving upper and lower lobe branches bilaterally. No evidence for right heart strain; 2. Bilateral atelectasis.  Small right pleural effusion. 3. Small hiatal hernia.   Review of Systems:   General: has fevers, chills, no changes in  body weight, has poor appetite, has fatigue HEENT: no blurry vision, hearing changes or sore throat Pulm: has dyspnea, no coughing, wheezing CV: has chest pain, no palpitations Abd: no nausea, vomiting, abdominal pain, diarrhea, constipation GU: no dysuria, burning on urination, increased urinary frequency, hematuria  Ext: has left lower leg edema Neuro: no unilateral weakness, numbness, or tingling, no vision change or hearing loss Skin: no rash MSK: No muscle spasm, no deformity, no limitation of range of movement in spin Heme: No easy bruising.  Travel history: No recent long distant travel.  Allergy:  Allergies  Allergen Reactions  . Penicillins Hives, Shortness Of Breath and Other (See Comments)    Childhood reaction was told her reaction may have been shortness of breath and hives Has patient had a PCN reaction causing immediate rash, facial/tongue/throat swelling, SOB or lightheadedness with hypotension: yes Has patient had a PCN reaction causing severe rash involving mucus membranes or skin necrosis: no Has patient had a PCN reaction that required hospitalization no Has patient had a PCN reaction occurring within the last 10 years: no If all of the above answers are "NO", then may proceed   . Flexeril [Cyclobenzaprine] Other (See Comments)    Heart problems - was told she should probably avoid all muscle relaxers   . Soma [Carisoprodol] Other (See Comments)    Heart problems - was told she should probably avoid all muscle relaxers    Past Medical History:  Diagnosis Date  . Arthritis   . Bilateral conjunctivitis    seen by PCP on 01/06/16, patient states Dr Alvan Dame aware   .  Complication of anesthesia    slow to wake up with tonsils and tubal ligation   . Fibromyalgia   . GERD (gastroesophageal reflux disease)    occasional   . Hypothyroidism   . PONV (postoperative nausea and vomiting)     Past Surgical History:  Procedure Laterality Date  . CARPAL TUNNEL RELEASE      . EYE SURGERY    . MANDIBLE SURGERY    . ROOT CANAL    . SHOULDER SURGERY    . TONSILLECTOMY    . TOTAL HIP ARTHROPLASTY Left 02/01/2016   Procedure: LEFT TOTAL HIP ARTHROPLASTY ANTERIOR APPROACH;  Surgeon: Paralee Cancel, MD;  Location: [facility];  Service: Orthopedics;  Laterality: Left;  Failed Spinal to LMA  . TUBAL LIGATION      Social History:  reports that she has quit smoking. She has never used smokeless tobacco. She reports that she drinks alcohol. She reports that she does not use drugs.  Family History:  Family History  Problem Relation Age of Onset  . Hypertension Mother   . Cancer Father     brain  . Cancer Other     colon  . Heart attack Other   . Cancer Other     throat     Prior to Admission medications   Medication Sig Start Date End Date Taking? Authorizing Provider  diazepam (VALIUM) 5 MG tablet Take 1 tablet (5 mg total) by mouth every 6 (six) hours as needed for muscle spasms. 02/02/16  Yes Paralee Cancel, MD  HYDROcodone-acetaminophen (NORCO/VICODIN) 5-325 MG tablet Take 1 tablet by mouth every 6 (six) hours as needed for moderate pain.   Yes Historical Provider, MD  levothyroxine (SYNTHROID, LEVOTHROID) 100 MCG tablet TAKE ONE TABLET BY MOUTH ONCE DAILY Patient taking differently: TAKE 100 MCG BY MOUTH ONCE DAILY 03/04/16  Yes Mikey Kirschner, MD  loratadine (CLARITIN) 10 MG tablet Take 10 mg by mouth daily as needed for allergies.   Yes Historical Provider, MD  Menthol (ICY HOT) 5 % PTCH Place 1 patch onto the skin as needed (For pain.).   Yes Historical Provider, MD  Multiple Vitamin (MULTIVITAMIN WITH MINERALS) TABS tablet Take 1 tablet by mouth daily.   Yes Historical Provider, MD  HYDROcodone-acetaminophen (NORCO) 7.5-325 MG tablet Take 1-2 tablets by mouth every 4 (four) hours as needed (breakthrough pain). Patient not taking: Reported on 03/09/2016 02/02/16   Merla Riches, PA-C  methocarbamol (ROBAXIN) 500 MG tablet Take 1 tablet (500 mg total) by mouth every 6  (six) hours as needed for muscle spasms. Patient not taking: Reported on 03/09/2016 02/02/16   Merla Riches, PA-C  mometasone (ELOCON) 0.1 % cream Apply to affected area BID prn Patient not taking: Reported on 03/09/2016 10/02/15 10/01/16  [name], MD  sulfacetamide (BLEPH-10) 10 % ophthalmic solution Place 1 drop into both eyes every 2 (two) hours while awake. Today; Then QID starting tomorrow Patient not taking: Reported on 03/09/2016 01/15/16   Nilda Simmer, NP    Physical Exam: Vitals:   03/09/16 1825 03/09/16 1930 03/09/16 2103 03/09/16 2124  BP:  113/62 112/74   Pulse:  110 100   Resp:  17 19   Temp:    99.5 F (37.5 C)  TempSrc:    Oral  SpO2:  95% 96%   Weight: 88.9 kg (196 lb)     Height: 5\' 4"  (1.626 m)      General: Not in acute distress HEENT:  Eyes: PERRL, EOMI, no scleral icterus.       ENT: No discharge from the ears and nose, no pharynx injection, no tonsillar enlargement.        Neck: No JVD, no bruit, no mass felt. Heme: No neck lymph node enlargement. Cardiac: S1/S2, RRR, Tachycardia, No murmurs, No gallops or rubs. Pulm: No rales, wheezing, rhonchi or rubs. Abd: Soft, nondistended, nontender, no rebound pain, no organomegaly, BS present. GU: No hematuria Ext: has tenderness in left calf area and mild left leg edema. 2+DP/PT pulse bilaterally. Musculoskeletal: No joint deformities, No joint redness or warmth, no limitation of ROM in spin. Skin: No rashes.  Neuro: Alert, oriented X3, cranial nerves II-XII grossly intact, moves all extremities normally.  Psych: Patient is not psychotic, no suicidal or hemocidal ideation.  Labs on Admission: I have personally reviewed following labs and imaging studies  CBC:  Recent Labs Lab 03/09/16 1849  WBC 14.7*  HGB 12.5  HCT 39.4  MCV 80.7  PLT [id number]   Basic Metabolic Panel:  Recent Labs Lab 03/09/16 1849  NA 136  K 3.8  CL 103  CO2 25  GLUCOSE 117*  BUN 8  CREATININE 0.89  CALCIUM 9.2    GFR: Estimated Creatinine Clearance: 74.4 mL/min (by C-G formula based on SCr of 0.89 mg/dL). Liver Function Tests: No results for input(s): AST, ALT, ALKPHOS, BILITOT, PROT, ALBUMIN in the last 168 hours. No results for input(s): LIPASE, AMYLASE in the last 168 hours. No results for input(s): AMMONIA in the last 168 hours. Coagulation Profile:  Recent Labs Lab 03/09/16 2030  INR 1.07   Cardiac Enzymes: No results for input(s): CKTOTAL, CKMB, CKMBINDEX, TROPONINI in the last 168 hours. BNP (last 3 results) No results for input(s): PROBNP in the last 8760 hours. HbA1C: No results for input(s): HGBA1C in the last 72 hours. CBG: No results for input(s): GLUCAP in the last 168 hours. Lipid Profile: No results for input(s): CHOL, HDL, LDLCALC, TRIG, CHOLHDL, LDLDIRECT in the last 72 hours. Thyroid Function Tests: No results for input(s): TSH, T4TOTAL, FREET4, T3FREE, THYROIDAB in the last 72 hours. Anemia Panel: No results for input(s): VITAMINB12, FOLATE, FERRITIN, TIBC, IRON, RETICCTPCT in the last 72 hours. Urine analysis: No results found for: COLORURINE, APPEARANCEUR, LABSPEC, PHURINE, GLUCOSEU, HGBUR, BILIRUBINUR, KETONESUR, PROTEINUR, UROBILINOGEN, NITRITE, LEUKOCYTESUR Sepsis Labs: @LABRCNTIP (procalcitonin:4,lacticidven:4) )No results found for this or any previous visit (from the past 240 hour(s)).   Radiological Exams on Admission: Dg Chest 2 View  Result Date: 03/09/2016 CLINICAL DATA:  Onset of right chest pain and SOB today - pt states she can not take in a deep breath - states two weeks ago her left leg suddenly became swollen, she was seen in ER and treated for DVT - hx left total hip replacement @ 5 weeks agp EXAM: CHEST - 2 VIEW COMPARISON:  none FINDINGS: Low lung volumes with some crowding of perihilar and bibasilar bronchovascular structures. No focal infiltrate or overt edema. The heart size upper limits normal, probably emphasized by low lung volumes. No  effusion or pneumothorax. Cervical fixation hardware noted. IMPRESSION: Low volumes.  No acute disease. Electronically Signed   By: Lucrezia Europe M.D.   On: 03/09/2016 18:59   Ct Angio Chest Pe W And/or Wo Contrast  Result Date: 03/09/2016 CLINICAL DATA:  Right-sided chest pain. Shortness of breath. Symptoms started this morning. EXAM: CT ANGIOGRAPHY CHEST WITH CONTRAST TECHNIQUE: Multidetector CT imaging of the chest was performed using the standard protocol during bolus administration of intravenous  contrast. Multiplanar CT image reconstructions and MIPs were obtained to evaluate the vascular anatomy. CONTRAST:  100 cc Isovue 370 COMPARISON:  03/09/2016 FINDINGS: Heart: Heart size is normal. No imaged pericardial effusion or significant coronary artery calcifications. The RV/LV ratio is normal. Vascular structures: There is significant pulmonary embolus, involving upper and lower lobe branches bilaterally. Aortic arch is normal in appearance. Mediastinum/thyroid: The visualized portion of the thyroid gland has a normal appearance. No mediastinal, hilar, or axillary adenopathy. Small hiatal hernia is noted. Otherwise the esophagus is normal in appearance. Lungs/Airways: There is right lower lobe atelectasis and a small right pleural effusion. Small amount atelectasis is also identified at the left lung base. Upper abdomen: Unremarkable. Chest wall/osseous structures: Moderate degenerative changes in the thoracic spine. Prior cervical fusion. Review of the MIP images confirms the above findings. IMPRESSION: 1. Bilateral pulmonary emboli, involving upper and lower lobe branches bilaterally. No evidence for right heart strain. 2. Bilateral atelectasis.  Small right pleural effusion. 3. Small hiatal hernia. 4. Critical Value/emergent results were called by telephone at the time of interpretation on 03/09/2016 at 8:23 pm to Dr. Shary Decamp , who verbally acknowledged these results. Electronically Signed   By: Nolon Nations M.D.   On: 03/09/2016 20:24     EKG: Independently reviewed.  Sinus rhythm, QTC 416, mild T-wave inversion in V3-V6, right axis deviation, tachycardia. Assessment/Plan Principal Problem:   PE (pulmonary embolism) Active Problems:   Hypothyroidism   Fibromyalgia   Status post total replacement of left hip   Pulmonary embolism (HCC)   PE (pulmonary embolism): Patient's shortness of breath and chest pain are mostly caused by pulmonary embolism. CTA showed bilateral pulmonary emboli, involving upper and lower lobe branches bilaterally, without evidence for right heart strain. Currently hemodynamically stable. This is most likely provoked by recent hip surgery. No family history of blood clots.  -will place on tele bed for obs -heparin drip initiated -2D echocardiogram ordered -LE dopplers ordered to evaluate for DVT -pain control: When necessary Norcor and morphine -IVF: NS 75 cc/h  Hypothyroidism: Last TSH was 1.03 on 02/23/15 -Continue home Synthroid  Fibromyalgia: -Continue home Norco prn  Fever and leukocytosis: Temperature 99.9, CBC 14.7. No source of infection identified. CT angiogram did not show infiltration. No symptoms of UTI. Likely due to blood clot. -will hold off Abx now -will get blood culture if spike fever again -check UA  DVT ppx: on IV Heparin Code Status: Full code Family Communication: Yes, patient's husband at bed side Disposition Plan:  Anticipate discharge back to previous home environment Consults called:  none Admission status: Obs / tele   Date of Service 03/09/2016    Ivor Costa [facility] Pager [phone]  If 7PM-7AM, please contact night-coverage [email or web address] Password [email or web address] 03/09/2016, 9:28 PM

## 2016-03-09 NOTE — ED Notes (Signed)
Patient transported to CT 

## 2016-03-09 NOTE — ED Provider Notes (Signed)
[place] DEPT Provider Note   CSN: [id number] Arrival date & time: 03/09/16  1740  First Provider Contact:  First MD Initiated Contact with Patient 03/09/16 1902     History   Chief Complaint Chief Complaint  Patient presents with  . Chest Pain  . Shortness of Breath    HPI Wendy Marshall is a 60 y.o. female.  HPI 60 y.o. female with hx hypothyroidism, presents to the Emergency Department today complaining of CP/SOB since this AM. Pt states that she noted right sided chest pain that is pleuritic in nature. Rates pain 6/10. Worse with deep inspiration. Decided to wait to see Dr. Alvan Dame for her follow up appointment from previous hip surgery in July before coming to ED. Told by physician to come to ED for evaluation. Was recently seen at [facility] for unilateral leg swelling of right leg. DVT US without evidence of DVT. No swelling apparent today. Pt endorse mild fever this morning. No N/V/D. No ABD pain. No numbness/tingling. No risk factors for ACS. No FH. No hx HTN, HLD, DM. Pt is not on blood thinners. No other symptoms noted.    Past Medical History:  Diagnosis Date  . Arthritis   . Bilateral conjunctivitis    seen by PCP on 01/06/16, patient states Dr Alvan Dame aware   . Complication of anesthesia    slow to wake up with tonsils and tubal ligation   . Fibromyalgia   . GERD (gastroesophageal reflux disease)    occasional   . Hypothyroidism   . PONV (postoperative nausea and vomiting)     Patient Active Problem List   Diagnosis Date Noted  . Status post total replacement of left hip 02/01/2016  . Left-sided low back pain with left-sided sciatica 08/06/2015  . Hypothyroidism 08/05/2013  . Fibromyalgia 08/05/2013    Past Surgical History:  Procedure Laterality Date  . CARPAL TUNNEL RELEASE    . EYE SURGERY    . MANDIBLE SURGERY    . ROOT CANAL    . SHOULDER SURGERY    . TONSILLECTOMY    . TOTAL HIP ARTHROPLASTY Left 02/01/2016   Procedure: LEFT TOTAL HIP ARTHROPLASTY  ANTERIOR APPROACH;  Surgeon: Paralee Cancel, MD;  Location: [facility] ORS;  Service: Orthopedics;  Laterality: Left;  Failed Spinal to LMA  . TUBAL LIGATION      OB History    No data available       Home Medications    Prior to Admission medications   Medication Sig Start Date End Date Taking? Authorizing Provider  diazepam (VALIUM) 5 MG tablet Take 1 tablet (5 mg total) by mouth every 6 (six) hours as needed for muscle spasms. 02/02/16  Yes Paralee Cancel, MD  HYDROcodone-acetaminophen (NORCO/VICODIN) 5-325 MG tablet Take 1 tablet by mouth every 6 (six) hours as needed for moderate pain.   Yes Historical Provider, MD  levothyroxine (SYNTHROID, LEVOTHROID) 100 MCG tablet TAKE ONE TABLET BY MOUTH ONCE DAILY Patient taking differently: TAKE 100 MCG BY MOUTH ONCE DAILY 03/04/16  Yes Mikey Kirschner, MD  loratadine (CLARITIN) 10 MG tablet Take 10 mg by mouth daily as needed for allergies.   Yes Historical Provider, MD  Menthol (ICY HOT) 5 % PTCH Place 1 patch onto the skin as needed (For pain.).   Yes Historical Provider, MD  Multiple Vitamin (MULTIVITAMIN WITH MINERALS) TABS tablet Take 1 tablet by mouth daily.   Yes Historical Provider, MD  HYDROcodone-acetaminophen (NORCO) 7.5-325 MG tablet Take 1-2 tablets by mouth every 4 (  four) hours as needed (breakthrough pain). Patient not taking: Reported on 03/09/2016 02/02/16   Merla Riches, PA-C  methocarbamol (ROBAXIN) 500 MG tablet Take 1 tablet (500 mg total) by mouth every 6 (six) hours as needed for muscle spasms. Patient not taking: Reported on 03/09/2016 02/02/16   Merla Riches, PA-C  mometasone (ELOCON) 0.1 % cream Apply to affected area BID prn Patient not taking: Reported on 03/09/2016 10/02/15 10/01/16  [name], MD  sulfacetamide (BLEPH-10) 10 % ophthalmic solution Place 1 drop into both eyes every 2 (two) hours while awake. Today; Then QID starting tomorrow Patient not taking: Reported on 03/09/2016 01/15/16   Nilda Simmer, NP    Family History Family  History  Problem Relation Age of Onset  . Hypertension Mother   . Cancer Father     brain  . Cancer Other     colon  . Heart attack Other   . Cancer Other     throat    Social History Social History  Substance Use Topics  . Smoking status: Former Research scientist (life sciences)  . Smokeless tobacco: Never Used     Comment: quit smoking20 years ago   . Alcohol use Yes     Comment: rare     Allergies   Penicillins; Flexeril [cyclobenzaprine]; and Soma [carisoprodol]   Review of Systems Review of Systems ROS reviewed and all are negative for acute change except as noted in the HPI.  Physical Exam Updated Vital Signs BP 114/79 (BP Location: Left Arm)   Pulse 117   Temp 99.9 F (37.7 C) (Oral)   Resp 18   Ht 5\' 4"  (1.626 m)   Wt 88.9 kg   SpO2 95%   BMI 33.64 kg/m   Physical Exam  Constitutional: She is oriented to person, place, and time. Vital signs are normal. She appears well-developed and well-nourished.  HENT:  Head: Normocephalic.  Right Ear: Hearing normal.  Left Ear: Hearing normal.  Eyes: Conjunctivae and EOM are normal. Pupils are equal, round, and reactive to light.  Neck: Normal range of motion. Neck supple. No tracheal deviation present.  Cardiovascular: Regular rhythm, normal heart sounds and intact distal pulses.  Tachycardia present.   Pulmonary/Chest: Effort normal and breath sounds normal. No respiratory distress. She has no wheezes. She has no rales. She exhibits no tenderness.  Abdominal: Soft.  Neurological: She is alert and oriented to person, place, and time.  Skin: Skin is warm and dry.  Psychiatric: She has a normal mood and affect. Her speech is normal and behavior is normal. Thought content normal.   ED Treatments / Results  Labs (all labs ordered are listed, but only abnormal results are displayed) Labs Reviewed  CBC - Abnormal; Notable for the following:       Result Value   WBC 14.7 (*)    MCH 25.6 (*)    All other components within normal limits    BASIC METABOLIC PANEL  I-STAT TROPOININ, ED   EKG  EKG Interpretation None      Radiology Dg Chest 2 View  Result Date: 03/09/2016 CLINICAL DATA:  Onset of right chest pain and SOB today - pt states she can not take in a deep breath - states two weeks ago her left leg suddenly became swollen, she was seen in ER and treated for DVT - hx left total hip replacement @ 5 weeks agp EXAM: CHEST - 2 VIEW COMPARISON:  none FINDINGS: Low lung volumes with some crowding of perihilar and bibasilar  bronchovascular structures. No focal infiltrate or overt edema. The heart size upper limits normal, probably emphasized by low lung volumes. No effusion or pneumothorax. Cervical fixation hardware noted. IMPRESSION: Low volumes.  No acute disease. Electronically Signed   By: Lucrezia Europe M.D.   On: 03/09/2016 18:59   Ct Angio Chest Pe W And/or Wo Contrast  Result Date: 03/09/2016 CLINICAL DATA:  Right-sided chest pain. Shortness of breath. Symptoms started this morning. EXAM: CT ANGIOGRAPHY CHEST WITH CONTRAST TECHNIQUE: Multidetector CT imaging of the chest was performed using the standard protocol during bolus administration of intravenous contrast. Multiplanar CT image reconstructions and MIPs were obtained to evaluate the vascular anatomy. CONTRAST:  100 cc Isovue 370 COMPARISON:  03/09/2016 FINDINGS: Heart: Heart size is normal. No imaged pericardial effusion or significant coronary artery calcifications. The RV/LV ratio is normal. Vascular structures: There is significant pulmonary embolus, involving upper and lower lobe branches bilaterally. Aortic arch is normal in appearance. Mediastinum/thyroid: The visualized portion of the thyroid gland has a normal appearance. No mediastinal, hilar, or axillary adenopathy. Small hiatal hernia is noted. Otherwise the esophagus is normal in appearance. Lungs/Airways: There is right lower lobe atelectasis and a small right pleural effusion. Small amount atelectasis is also  identified at the left lung base. Upper abdomen: Unremarkable. Chest wall/osseous structures: Moderate degenerative changes in the thoracic spine. Prior cervical fusion. Review of the MIP images confirms the above findings. IMPRESSION: 1. Bilateral pulmonary emboli, involving upper and lower lobe branches bilaterally. No evidence for right heart strain. 2. Bilateral atelectasis.  Small right pleural effusion. 3. Small hiatal hernia. 4. Critical Value/emergent results were called by telephone at the time of interpretation on 03/09/2016 at 8:23 pm to Dr. Shary Decamp , who verbally acknowledged these results. Electronically Signed   By: Nolon Nations M.D.   On: 03/09/2016 20:24    Procedures Procedures (including critical care time) CRITICAL CARE Performed by: Ozella Rocks  Total critical care time: 35 minutes  Critical care time was exclusive of separately billable procedures and treating other patients.  Critical care was necessary to treat or prevent imminent or life-threatening deterioration.  Critical care was time spent personally by me on the following activities: development of treatment plan with patient and/or surrogate as well as nursing, discussions with consultants, evaluation of patient's response to treatment, examination of patient, obtaining history from patient or surrogate, ordering and performing treatments and interventions, ordering and review of laboratory studies, ordering and review of radiographic studies, pulse oximetry and re-evaluation of patient's condition.   Medications Ordered in ED Medications  sodium chloride 0.9 % bolus 1,000 mL (not administered)  morphine 4 MG/ML injection 4 mg (not administered)     Initial Impression / Assessment and Plan / ED Course  I have reviewed the triage vital signs and the nursing notes.  Pertinent labs & imaging results that were available during my care of the patient were reviewed by me and considered in my medical decision  making (see chart for details).  Clinical Course    Final Clinical Impressions(s) / ED Diagnoses  I have reviewed and evaluated the relevant laboratory valuesI have reviewed and evaluated the relevant imaging studies. I have interpreted the relevant EKG.I have reviewed the relevant previous healthcare records.I obtained HPI from historian. Patient discussed with supervising physician  ED Course:  Assessment: Pt is a 58yF with hx hypothyroidism who presents with right sided chest pain with onset this AM. Pleuritic in nature. Unrelenting since then. Hx of left total  hip arthroplasty on 02-01-16. No hx DVT/PE. Recently seen at [facility] for unilateral leg swelling with negative DVT US. On exam, pt in NAD. Nontoxic/nonseptic appearing. VS show tachycardia. Temp 99.9. Lungs CTA. Heart RRR. Abdomen nontender soft. iStat trop neg. CBC/BMP unremarkable. CXR unremarkable. Due to risk factors for PE, CT Angio ordered, which showed bilateral upper and lower pulmonary emboli. Started on Heparin drip. Given fluids and analgesia in ED. Plan is to Admit to medicine. Pt hemodynamically stable.    Disposition/Plan:  Admit to Medicine Pt acknowledges and agrees with plan  Supervising Physician Veryl Speak, MD   Final diagnoses:  Other acute pulmonary embolism without acute cor pulmonale [facility])    New Prescriptions New Prescriptions   No medications on file      Shary Decamp, PA-C 03/09/16 2047    Veryl Speak, MD 03/09/16 2319

## 2016-03-09 NOTE — ED Notes (Signed)
WILL TRANSPORT PT TO 4E 1424-1. AAOX4. PT IN NO APPARENT DISTRESS OR PAIN. IV HEPARIN INFUSING W/O PAIN OR SWELLING. THE OPPORTUNITY TO ASK QUESTIONS WAS PROVIDED.

## 2016-03-09 NOTE — Progress Notes (Signed)
ANTICOAGULATION CONSULT NOTE - Initial Consult  Pharmacy Consult for IV heparin Indication: pulmonary embolus  Allergies  Allergen Reactions  . Penicillins Hives, Shortness Of Breath and Other (See Comments)    Childhood reaction was told her reaction may have been shortness of breath and hives Has patient had a PCN reaction causing immediate rash, facial/tongue/throat swelling, SOB or lightheadedness with hypotension: yes Has patient had a PCN reaction causing severe rash involving mucus membranes or skin necrosis: no Has patient had a PCN reaction that required hospitalization no Has patient had a PCN reaction occurring within the last 10 years: no If all of the above answers are "NO", then may proceed   . Flexeril [Cyclobenzaprine] Other (See Comments)    Heart problems - was told she should probably avoid all muscle relaxers   . Soma [Carisoprodol] Other (See Comments)    Heart problems - was told she should probably avoid all muscle relaxers    Patient Measurements: Height: 5\' 4"  (162.6 cm) Weight: 196 lb (88.9 kg) IBW/kg (Calculated) : 54.7 Heparin Dosing Weight: 74.5 kg (used [place] for initial dosing)  Vital Signs: Temp: 99.9 F (37.7 C) (08/09 1755) Temp Source: Oral (08/09 1755) BP: 113/62 (08/09 1930) Pulse Rate: 110 (08/09 1930)  Labs:  Recent Labs  03/09/16 1849 03/09/16 2030  HGB 12.5  --   HCT 39.4  --   PLT 330  --   APTT  --  34  LABPROT  --  13.9  INR  --  1.07  CREATININE 0.89  --     Estimated Creatinine Clearance: 74.4 mL/min (by C-G formula based on SCr of 0.89 mg/dL).   Medical History: Past Medical History:  Diagnosis Date  . Arthritis   . Bilateral conjunctivitis    seen by PCP on 01/06/16, patient states Dr Alvan Dame aware   . Complication of anesthesia    slow to wake up with tonsils and tubal ligation   . Fibromyalgia   . GERD (gastroesophageal reflux disease)    occasional   . Hypothyroidism   . PONV (postoperative nausea and  vomiting)     Assessment: 66 yoF with PMH hypothyroid, s/p TKA 02/01/16 presents with pleuritic CP and SOB < 24 hr. CT chest reveals bilateral PE w/o R heart strain. Did present on 7/16 with DVT-like symptoms but negative findings on [place]. Pharmacy to dose heparin IV.   Baseline INR, aPTT: wnl  Prior anticoagulation: none  Significant events:  Today, 03/09/2016:  CBC: wnl  No bleeding or infusion issues per nursing  CrCl: 74 ml/min  Goal of Therapy: Heparin level 0.3-0.7 units/ml Monitor platelets by anticoagulation protocol: Yes  Plan:  Heparin 2000 units IV bolus x 1  Heparin 1250 units/hr IV infusion  Check heparin level 6 hrs after start  Daily CBC, daily heparin level once stable  Monitor for signs of bleeding or thrombosis   Reuel Boom, PharmD Pager: [phone] 03/09/2016, 8:59 PM

## 2016-03-10 ENCOUNTER — Observation Stay (HOSPITAL_BASED_OUTPATIENT_CLINIC_OR_DEPARTMENT_OTHER): Payer: BLUE CROSS/BLUE SHIELD

## 2016-03-10 ENCOUNTER — Telehealth (HOSPITAL_COMMUNITY): Payer: Self-pay

## 2016-03-10 ENCOUNTER — Ambulatory Visit (HOSPITAL_COMMUNITY): Payer: BLUE CROSS/BLUE SHIELD

## 2016-03-10 DIAGNOSIS — M7989 Other specified soft tissue disorders: Secondary | ICD-10-CM | POA: Diagnosis not present

## 2016-03-10 DIAGNOSIS — Z96642 Presence of left artificial hip joint: Secondary | ICD-10-CM | POA: Diagnosis present

## 2016-03-10 DIAGNOSIS — R0602 Shortness of breath: Secondary | ICD-10-CM | POA: Diagnosis present

## 2016-03-10 DIAGNOSIS — M797 Fibromyalgia: Secondary | ICD-10-CM | POA: Diagnosis present

## 2016-03-10 DIAGNOSIS — E669 Obesity, unspecified: Secondary | ICD-10-CM | POA: Diagnosis present

## 2016-03-10 DIAGNOSIS — Z8249 Family history of ischemic heart disease and other diseases of the circulatory system: Secondary | ICD-10-CM | POA: Diagnosis not present

## 2016-03-10 DIAGNOSIS — I2699 Other pulmonary embolism without acute cor pulmonale: Principal | ICD-10-CM

## 2016-03-10 DIAGNOSIS — Z87891 Personal history of nicotine dependence: Secondary | ICD-10-CM | POA: Diagnosis not present

## 2016-03-10 DIAGNOSIS — D72829 Elevated white blood cell count, unspecified: Secondary | ICD-10-CM | POA: Diagnosis present

## 2016-03-10 DIAGNOSIS — Z6834 Body mass index (BMI) 34.0-34.9, adult: Secondary | ICD-10-CM | POA: Diagnosis not present

## 2016-03-10 DIAGNOSIS — R509 Fever, unspecified: Secondary | ICD-10-CM | POA: Diagnosis not present

## 2016-03-10 DIAGNOSIS — Z79899 Other long term (current) drug therapy: Secondary | ICD-10-CM | POA: Diagnosis not present

## 2016-03-10 DIAGNOSIS — E039 Hypothyroidism, unspecified: Secondary | ICD-10-CM | POA: Diagnosis present

## 2016-03-10 DIAGNOSIS — K219 Gastro-esophageal reflux disease without esophagitis: Secondary | ICD-10-CM | POA: Diagnosis present

## 2016-03-10 DIAGNOSIS — R091 Pleurisy: Secondary | ICD-10-CM | POA: Diagnosis present

## 2016-03-10 DIAGNOSIS — Z981 Arthrodesis status: Secondary | ICD-10-CM | POA: Diagnosis not present

## 2016-03-10 LAB — BASIC METABOLIC PANEL
Anion gap: 6 (ref 5–15)
BUN: 8 mg/dL (ref 6–20)
CO2: 26 mmol/L (ref 22–32)
Calcium: 8.6 mg/dL — ABNORMAL LOW (ref 8.9–10.3)
Chloride: 106 mmol/L (ref 101–111)
Creatinine, Ser: 0.7 mg/dL (ref 0.44–1.00)
GFR calc Af Amer: >60 mL/min (ref >60)
GFR calc non Af Amer: >60 mL/min (ref >60)
Glucose, Bld: 119 mg/dL — ABNORMAL HIGH (ref 65–99)
Potassium: 4 mmol/L (ref 3.5–5.1)
Sodium: 138 mmol/L (ref 135–145)

## 2016-03-10 LAB — URINALYSIS, ROUTINE W REFLEX MICROSCOPIC
Bilirubin Urine: NEGATIVE
Glucose, UA: NEGATIVE mg/dL
Hgb urine dipstick: NEGATIVE
Ketones, ur: NEGATIVE mg/dL
Leukocytes, UA: NEGATIVE
Nitrite: NEGATIVE
Protein, ur: NEGATIVE mg/dL
Specific Gravity, Urine: 1.038 — ABNORMAL HIGH (ref 1.005–1.030)
pH: 6 (ref 5.0–8.0)

## 2016-03-10 LAB — CBC
HCT: 34.1 % — ABNORMAL LOW (ref 36.0–46.0)
Hemoglobin: 10.2 g/dL — ABNORMAL LOW (ref 12.0–15.0)
MCH: 24.3 pg — ABNORMAL LOW (ref 26.0–34.0)
MCHC: 29.9 g/dL — ABNORMAL LOW (ref 30.0–36.0)
MCV: 81.2 fL (ref 78.0–100.0)
Platelets: 285 10*3/uL (ref 150–400)
RBC: 4.2 MIL/uL (ref 3.87–5.11)
RDW: 14 % (ref 11.5–15.5)
WBC: 11.7 10*3/uL — ABNORMAL HIGH (ref 4.0–10.5)

## 2016-03-10 LAB — HEPARIN LEVEL (UNFRACTIONATED)
Heparin Unfractionated: 0.27 IU/mL — ABNORMAL LOW (ref 0.30–0.70)
Heparin Unfractionated: 0.42 IU/mL (ref 0.30–0.70)

## 2016-03-10 LAB — ECHOCARDIOGRAM COMPLETE
Height: 64 in
Weight: 3178.15 oz

## 2016-03-10 LAB — GLUCOSE, CAPILLARY: Glucose-Capillary: 87 mg/dL (ref 65–99)

## 2016-03-10 MED ORDER — PERFLUTREN LIPID MICROSPHERE
INTRAVENOUS | Status: AC
  Filled 2016-03-10: qty 10

## 2016-03-10 MED ORDER — PERFLUTREN LIPID MICROSPHERE
1.0000 mL | INTRAVENOUS | Status: AC | PRN
  Administered 2016-03-10: 3 mL via INTRAVENOUS

## 2016-03-10 MED ORDER — CHLORHEXIDINE GLUCONATE 0.12 % MT SOLN
15.0000 mL | Freq: Two times a day (BID) | OROMUCOSAL | Status: DC
  Administered 2016-03-10 – 2016-03-11 (×3): 15 mL via OROMUCOSAL
  Filled 2016-03-10 (×3): qty 15

## 2016-03-10 MED ORDER — APIXABAN 5 MG PO TABS
10.0000 mg | ORAL_TABLET | Freq: Two times a day (BID) | ORAL | Status: DC
  Administered 2016-03-10 – 2016-03-11 (×3): 10 mg via ORAL
  Filled 2016-03-10 (×3): qty 2

## 2016-03-10 MED ORDER — HEPARIN (PORCINE) IN NACL 100-0.45 UNIT/ML-% IJ SOLN: 1400.0000 [IU]/h | INTRAMUSCULAR | Status: DC

## 2016-03-10 MED ORDER — CETYLPYRIDINIUM CHLORIDE 0.05 % MT LIQD
7.0000 mL | Freq: Two times a day (BID) | OROMUCOSAL | Status: DC
  Administered 2016-03-10 – 2016-03-11 (×3): 7 mL via OROMUCOSAL

## 2016-03-10 MED ORDER — APIXABAN 5 MG PO TABS: 5.0000 mg | ORAL_TABLET | Freq: Two times a day (BID) | ORAL | Status: DC

## 2016-03-10 MED ORDER — APIXABAN 5 MG PO TABS: 10.0000 mg | ORAL_TABLET | Freq: Two times a day (BID) | ORAL | Status: DC

## 2016-03-10 NOTE — Telephone Encounter (Signed)
Husband called and canceled for this week,she is in the hospital.

## 2016-03-10 NOTE — Progress Notes (Signed)
VASCULAR LAB PRELIMINARY  PRELIMINARY  PRELIMINARY  PRELIMINARY   Bilateral lower extremity venous duplex has been completed.    Bilateral:  No evidence of DVT, superficial thrombosis, or Baker's Cyst.  Gave the result Stanton Kidney, RN  Janifer Adie, RVT, RDMS 03/10/2016, 10:05 AM

## 2016-03-10 NOTE — Progress Notes (Signed)
Pt will continue with her Out Pt rehab at discharge.

## 2016-03-10 NOTE — Progress Notes (Addendum)
ANTICOAGULATION CONSULT NOTE - f/u Consult  Pharmacy Consult for IV heparin Indication: pulmonary embolus  Allergies  Allergen Reactions  . Penicillins Hives, Shortness Of Breath and Other (See Comments)    Childhood reaction was told her reaction may have been shortness of breath and hives Has patient had a PCN reaction causing immediate rash, facial/tongue/throat swelling, SOB or lightheadedness with hypotension: yes Has patient had a PCN reaction causing severe rash involving mucus membranes or skin necrosis: no Has patient had a PCN reaction that required hospitalization no Has patient had a PCN reaction occurring within the last 10 years: no If all of the above answers are "NO", then may proceed   . Flexeril [Cyclobenzaprine] Other (See Comments)    Heart problems - was told she should probably avoid all muscle relaxers   . Soma [Carisoprodol] Other (See Comments)    Heart problems - was told she should probably avoid all muscle relaxers    Patient Measurements: Height: 5\' 4"  (162.6 cm) Weight: 198 lb 10.2 oz (90.1 kg) IBW/kg (Calculated) : 54.7 Heparin Dosing Weight: 74.5 kg (used [place] for initial dosing)  Vital Signs: Temp: 99.8 F (37.7 C) (08/10 0510) Temp Source: Oral (08/10 0510) BP: 111/66 (08/10 0510) Pulse Rate: 85 (08/10 0510)  Labs:  Recent Labs  03/09/16 1849 03/09/16 2030 03/10/16 0244 03/10/16 0938  HGB 12.5  --  10.2*  --   HCT 39.4  --  34.1*  --   PLT 330  --  285  --   APTT  --  34  --   --   LABPROT  --  13.9  --   --   INR  --  1.07  --   --   HEPARINUNFRC  --   --  0.27* 0.42  CREATININE 0.89  --  0.70  --     Estimated Creatinine Clearance: 83.4 mL/min (by C-G formula based on SCr of 0.8 mg/dL).   Medical History: Past Medical History:  Diagnosis Date  . Arthritis   . Bilateral conjunctivitis    seen by PCP on 01/06/16, patient states Dr Alvan Dame aware   . Complication of anesthesia    slow to wake up with tonsils and tubal  ligation   . Fibromyalgia   . GERD (gastroesophageal reflux disease)    occasional   . Hypothyroidism   . PONV (postoperative nausea and vomiting)     Assessment: 72 yoF with PMH hypothyroid, s/p TKA 02/01/16 presents with pleuritic CP and SOB < 24 hr. CT chest reveals bilateral PE w/o R heart strain. Did present on 7/16 with DVT-like symptoms but negative findings on [place]. Pharmacy to dose heparin IV.  Baseline INR, aPTT: wnl  Prior anticoagulation: none  Significant events:  Today, 03/10/2016   Heparin level therapeutic (0.42) on 1400 units/hr  CBC: slightly decreased  No bleeding or infusion issues per nursing  CrCl: 83 ml/min  Goal of Therapy: Heparin level 0.3-0.7 units/ml Monitor platelets by anticoagulation protocol: Yes  Plan:  Continue heparin drip at 1400 units/hr  Recheck HL in 6 hours to confirm therapeutic  Daily CBC, daily heparin level once stable  Monitor for signs of bleeding or thrombosis  Per discussion with MD, plan transition to [place] pending echo results and improvement in chest pain.   Peggyann Juba, PharmD, BCPS Pager: [phone] 03/10/2016, 10:35 AM   Addendum: order received to transition IV heparin to apixaban - DC heparin, then immediate start apixaban as follows: 10 mg twice daily for 7  days followed by 5 mg twice daily Will provide education prior to discharge

## 2016-03-10 NOTE — Progress Notes (Signed)
Echocardiogram 2D Echocardiogram with drfinity has been performed.  Wendy Marshall 03/10/2016, 10:28 AM

## 2016-03-10 NOTE — Progress Notes (Signed)
PROGRESS NOTE                                                                                                                                                                                                             Patient Demographics:    Wendy Marshall, is a 60 y.o. female, DOB - 1955-12-11, YH:[id number]  Admit date - 03/09/2016   Admitting Physician Ivor Costa, MD  Outpatient Primary MD for the patient is Sallee Lange, MD  LOS - 0  Outpatient Specialists: none  Chief Complaint  Patient presents with  . Chest Pain  . Shortness of Breath       Brief Narrative   60 year old obese female with recent left hip replacement done on 02/01/2016 (received 3- 4 weeks of full dose aspirin for DVT prophylaxis postop), GERD, hypothyroidism, fibromyalgia, presented with substernal chest pain with shortness of breath and left leg swelling. Patient informs developing left leg swelling and pain after surgery and had a Doppler of her left lower extremity done which was negative for DVT. The swelling subsequently improved. On the day of admission she started having chest pain with shortness of breath, nonradiating aggravated with deep breathing. In the ED she had a white count of 14.7 K, normal hemoglobin and electrolytes. CT angiogram of the chest showed bilateral PE involving upper and lower lobe branches without heart strain.   Subjective:    Patient still has some chest discomfort worsened with deep inspiration but improved since admission. Denies further shortness of breath and able to ambulate without oxygen desaturation.   Assessment  & Plan :    Principal Problem:   Acute bilateral PE (pulmonary embolism) (HCC) Likely contributed by lower extremity DVT following recent left hip replacement. Placed on IV heparin on admission. Doppler left lower extremity negative for DVT and 2-D echo showed normal EF with no right heart strain.  Switch to oral Eliquis. Plan to treat for at least 6 months.  Active Problems: Chest pain with pleurisy Requiring IV morphine and Vicodin for pain control. Will add Toradol. I suspect her fibromyalgia may also be contributing.     Status post total replacement of left hip Stable and ambulating     Leukocytosis and low-grade fever Secondary to acute PE.   Hypothyroidism Continue Synthroid.      Code Status : Full code  Family Communication  : None at bedside  Disposition Plan  : Home in the morning if chest pain better  Barriers For Discharge : Ongoing chest discomfort  Consults  :  None  Procedures  :  CT angiogram of the chest 2-D echo  DVT Prophylaxis  :  eliquis  Lab Results  Component Value Date   PLT 285 03/10/2016    Antibiotics  :    Anti-infectives    None        Objective:   Vitals:   03/09/16 2130 03/09/16 2200 03/09/16 2246 03/10/16 0510  BP: (!) 100/51 99/64 122/73 111/66  Pulse: 100 103 97 85  Resp: 14 22 20 20   Temp:   99.9 F (37.7 C) 99.8 F (37.7 C)  TempSrc:   Oral Oral  SpO2: 94% 91% 100% 99%  Weight:   90.1 kg (198 lb 10.2 oz)   Height:   5\' 4"  (1.626 m)     Wt Readings from Last 3 Encounters:  03/09/16 90.1 kg (198 lb 10.2 oz)  02/14/16 90.7 kg (200 lb)  02/01/16 90.7 kg (200 lb)     Intake/Output Summary (Last 24 hours) at 03/10/16 1423 Last data filed at 03/10/16 0542  Gross per 24 hour  Intake           610.03 ml  Output              800 ml  Net          -189.97 ml     Physical Exam  Gen: In some discomfort HEENT: no pallor, moist mucosa, supple neck Chest: clear b/l, no added sounds, reproducible pain on pressure over her substernal area CVS: N S1&S2, no murmurs, rubs or gallop GI: soft, NT, ND, BS+ Musculoskeletal: warm, no edema     Data Review:    CBC  Recent Labs Lab 03/09/16 1849 03/10/16 0244  WBC 14.7* 11.7*  HGB 12.5 10.2*  HCT 39.4 34.1*  PLT 330 285  MCV 80.7 81.2  MCH 25.6*  24.3*  MCHC 31.7 29.9*  RDW 13.8 14.0    Chemistries   Recent Labs Lab 03/09/16 1849 03/10/16 0244  NA 136 138  K 3.8 4.0  CL 103 106  CO2 25 26  GLUCOSE 117* 119*  BUN 8 8  CREATININE 0.89 0.70  CALCIUM 9.2 8.6*   ------------------------------------------------------------------------------------------------------------------ No results for input(s): CHOL, HDL, LDLCALC, TRIG, CHOLHDL, LDLDIRECT in the last 72 hours.  No results found for: HGBA1C ------------------------------------------------------------------------------------------------------------------ No results for input(s): TSH, T4TOTAL, T3FREE, THYROIDAB in the last 72 hours.  Invalid input(s): FREET3 ------------------------------------------------------------------------------------------------------------------ No results for input(s): VITAMINB12, FOLATE, FERRITIN, TIBC, IRON, RETICCTPCT in the last 72 hours.  Coagulation profile  Recent Labs Lab 03/09/16 2030  INR 1.07    No results for input(s): DDIMER in the last 72 hours.  Cardiac Enzymes No results for input(s): CKMB, TROPONINI, MYOGLOBIN in the last 168 hours.  Invalid input(s): CK ------------------------------------------------------------------------------------------------------------------ No results found for: BNP  Inpatient Medications  Scheduled Meds: . antiseptic oral rinse  7 mL Mouth Rinse q12n4p  . apixaban  10 mg Oral BID  . [START ON 03/17/2016] apixaban  5 mg Oral BID  . chlorhexidine  15 mL Mouth Rinse BID  . levothyroxine  100 mcg Oral QAC breakfast  . multivitamin with minerals  1 tablet Oral Daily  . sodium chloride flush  3 mL Intravenous Q12H   Continuous Infusions: . sodium chloride 75 mL/hr at 03/10/16 1205   PRN  Meds:.acetaminophen **OR** acetaminophen, diazepam, HYDROcodone-acetaminophen, levalbuterol, loratadine, morphine injection, MUSCLE RUB, ondansetron **OR** ondansetron (ZOFRAN) IV  Micro Results No  results found for this or any previous visit (from the past 240 hour(s)).  Radiology Reports Dg Chest 2 View  Result Date: 03/09/2016 CLINICAL DATA:  Onset of right chest pain and SOB today - pt states she can not take in a deep breath - states two weeks ago her left leg suddenly became swollen, she was seen in ER and treated for DVT - hx left total hip replacement @ 5 weeks agp EXAM: CHEST - 2 VIEW COMPARISON:  none FINDINGS: Low lung volumes with some crowding of perihilar and bibasilar bronchovascular structures. No focal infiltrate or overt edema. The heart size upper limits normal, probably emphasized by low lung volumes. No effusion or pneumothorax. Cervical fixation hardware noted. IMPRESSION: Low volumes.  No acute disease. Electronically Signed   By: Lucrezia Europe M.D.   On: 03/09/2016 18:59   Ct Angio Chest Pe W And/or Wo Contrast  Result Date: 03/09/2016 CLINICAL DATA:  Right-sided chest pain. Shortness of breath. Symptoms started this morning. EXAM: CT ANGIOGRAPHY CHEST WITH CONTRAST TECHNIQUE: Multidetector CT imaging of the chest was performed using the standard protocol during bolus administration of intravenous contrast. Multiplanar CT image reconstructions and MIPs were obtained to evaluate the vascular anatomy. CONTRAST:  100 cc Isovue 370 COMPARISON:  03/09/2016 FINDINGS: Heart: Heart size is normal. No imaged pericardial effusion or significant coronary artery calcifications. The RV/LV ratio is normal. Vascular structures: There is significant pulmonary embolus, involving upper and lower lobe branches bilaterally. Aortic arch is normal in appearance. Mediastinum/thyroid: The visualized portion of the thyroid gland has a normal appearance. No mediastinal, hilar, or axillary adenopathy. Small hiatal hernia is noted. Otherwise the esophagus is normal in appearance. Lungs/Airways: There is right lower lobe atelectasis and a small right pleural effusion. Small amount atelectasis is also identified  at the left lung base. Upper abdomen: Unremarkable. Chest wall/osseous structures: Moderate degenerative changes in the thoracic spine. Prior cervical fusion. Review of the MIP images confirms the above findings. IMPRESSION: 1. Bilateral pulmonary emboli, involving upper and lower lobe branches bilaterally. No evidence for right heart strain. 2. Bilateral atelectasis.  Small right pleural effusion. 3. Small hiatal hernia. 4. Critical Value/emergent results were called by telephone at the time of interpretation on 03/09/2016 at 8:23 pm to Dr. Shary Decamp , who verbally acknowledged these results. Electronically Signed   By: Nolon Nations M.D.   On: 03/09/2016 20:24   US Venous Img Lower Unilateral Left  Result Date: 02/15/2016 CLINICAL DATA:  Left lower extremity pain and swelling. EXAM: LEFT LOWER EXTREMITY VENOUS DUPLEX ULTRASOUND TECHNIQUE: Doppler venous assessment of the left lower extremity deep venous system was performed, including characterization of spectral flow, compressibility, and phasicity. COMPARISON:  None. FINDINGS: There is complete compressibility of the common femoral, femoral, and popliteal veins. Doppler analysis demonstrates respiratory phasicity and augmentation of flow with calf compression. No obvious superficial vein or calf vein thrombosis. IMPRESSION: No evidence of DVT. Electronically Signed   By: Marybelle Killings M.D.   On: 02/15/2016 11:36    Time Spent in minutes  25   Louellen Molder M.D on 03/10/2016 at 2:23 PM  Between 7am to 7pm - Pager - [phone]  After 7pm go to [email or web address] - password [facility]  [facility] -  Office  [phone]

## 2016-03-10 NOTE — Progress Notes (Signed)
ANTICOAGULATION CONSULT NOTE - f/u Consult  Pharmacy Consult for IV heparin Indication: pulmonary embolus  Allergies  Allergen Reactions  . Penicillins Hives, Shortness Of Breath and Other (See Comments)    Childhood reaction was told her reaction may have been shortness of breath and hives Has patient had a PCN reaction causing immediate rash, facial/tongue/throat swelling, SOB or lightheadedness with hypotension: yes Has patient had a PCN reaction causing severe rash involving mucus membranes or skin necrosis: no Has patient had a PCN reaction that required hospitalization no Has patient had a PCN reaction occurring within the last 10 years: no If all of the above answers are "NO", then may proceed   . Flexeril [Cyclobenzaprine] Other (See Comments)    Heart problems - was told she should probably avoid all muscle relaxers   . Soma [Carisoprodol] Other (See Comments)    Heart problems - was told she should probably avoid all muscle relaxers    Patient Measurements: Height: 5\' 4"  (162.6 cm) Weight: 198 lb 10.2 oz (90.1 kg) IBW/kg (Calculated) : 54.7 Heparin Dosing Weight: 74.5 kg (used [place] for initial dosing)  Vital Signs: Temp: 99.9 F (37.7 C) (08/09 2246) Temp Source: Oral (08/09 2246) BP: 122/73 (08/09 2246) Pulse Rate: 97 (08/09 2246)  Labs:  Recent Labs  03/09/16 1849 03/09/16 2030 03/10/16 0244  HGB 12.5  --  10.2*  HCT 39.4  --  34.1*  PLT 330  --  285  APTT  --  34  --   LABPROT  --  13.9  --   INR  --  1.07  --   HEPARINUNFRC  --   --  0.27*  CREATININE 0.89  --   --     Estimated Creatinine Clearance: 74.9 mL/min (by C-G formula based on SCr of 0.89 mg/dL).   Medical History: Past Medical History:  Diagnosis Date  . Arthritis   . Bilateral conjunctivitis    seen by PCP on 01/06/16, patient states Dr Alvan Dame aware   . Complication of anesthesia    slow to wake up with tonsils and tubal ligation   . Fibromyalgia   . GERD (gastroesophageal  reflux disease)    occasional   . Hypothyroidism   . PONV (postoperative nausea and vomiting)     Assessment: 37 yoF with PMH hypothyroid, s/p TKA 02/01/16 presents with pleuritic CP and SOB < 24 hr. CT chest reveals bilateral PE w/o R heart strain. Did present on 7/16 with DVT-like symptoms but negative findings on [place]. Pharmacy to dose heparin IV.  Baseline INR, aPTT: wnl  Prior anticoagulation: none  Significant events:  8/9  CBC: wnl  No bleeding or infusion issues per nursing  CrCl: 74 ml/min  Today, 8/10 0244 HL=0.27 , no interuptions or bleeding per RN   Goal of Therapy: Heparin level 0.3-0.7 units/ml Monitor platelets by anticoagulation protocol: Yes  Plan:  Increase heparin drip to 1400 units/hr  Recheck HL in 6 hours  Daily CBC, daily heparin level once stable  Monitor for signs of bleeding or thrombosis   Lawana Pai R] 03/10/2016, 3:44 AM

## 2016-03-10 NOTE — Discharge Instructions (Addendum)
Pulmonary Embolism A pulmonary embolism (PE) is a sudden blockage or decrease of blood flow in one lung or both lungs. Most blockages come from a blood clot that travels from the legs or the pelvis to the lungs. PE is a dangerous and potentially life-threatening condition if it is not treated right away. CAUSES A pulmonary embolism occurs most commonly when a blood clot travels from one of your veins to your lungs. Rarely, PE is caused by air, fat, amniotic fluid, or part of a tumor traveling through your veins to your lungs. RISK FACTORS A PE is more likely to develop in:  People who smoke.  People who areolder, especially over 33 years of age.  People who are overweight (obese).  People who sit or lie still for a long time, such as during long-distance travel (over 4 hours), bed rest, hospitalization, or during recovery from certain medical conditions like a stroke.  People who do not engage in much physical activity (sedentary lifestyle).  People who have chronic breathing disorders.  People whohave a personal or family history of blood clots or blood clotting disease.  People whohave peripheral vascular disease (PVD), diabetes, or some types of cancer.  People who haveheart disease, especially if the person had a recent heart attack or has congestive heart failure.  People who have neurological diseases that affect the legs (leg paresis).  People who have had a traumatic injury, such as breaking a hip or leg.  People whohave recently had major or lengthy surgery, especially on the hip, knee, or abdomen.  People who have hada central line placed inside a large vein.  People who takemedicines that contain the hormone estrogen. These include birth control pills and hormone replacement therapy.  Pregnancy or during childbirth or the postpartum period. SIGNS AND SYMPTOMS  The symptoms of a PE usually start suddenly and include:  Shortness of breath while active or at  rest.  Coughing or coughing up blood or blood-tinged mucus.  Chest pain that is often worse with deep breaths.  Rapid or irregular heartbeat.  Feeling light-headed or dizzy.  Fainting.  Feelinganxious.  Sweating. There may also be pain and swelling in a leg if that is where the blood clot started. These symptoms may represent a serious problem that is an emergency. Do not wait to see if the symptoms will go away. Get medical help right away. Call your local emergency services (911 in the [place] Do not drive yourself to the hospital. DIAGNOSIS Your health care provider will take a medical history and perform a physical exam. You may also have other tests, including:  Blood tests to assess the clotting properties of your blood, assess oxygen levels in your blood, and find blood clots.  Imaging tests, such as CT, ultrasound, MRI, X-ray, and other tests to see if you have clots anywhere in your body.  An electrocardiogram (ECG) to look for heart strain from blood clots in the lungs. TREATMENT The main goals of PE treatment are:  To stop a blood clot from growing larger.  To stop new blood clots from forming. The type of treatment that you receive depends on many factors, such as the cause of your PE, your risk for bleeding or developing more clots, and other medical conditions that you have. Sometimes, a combination of treatments is necessary. This condition may be treated with:  Medicines, including newer oral blood thinners (anticoagulants), warfarin, low molecular weight heparins, thrombolytics, or heparins.  Wearing compression stockings or using different  types of devices.  Surgery (rare) to remove the blood clot or to place a filter in your abdomen to stop the blood clot from traveling to your lungs. Treatments for a PE are often divided into immediate treatment, long-term treatment (up to 3 months after PE), and extended treatment (more than 3 months after PE). Your  treatment may continue for several months. This is called maintenance therapy, and it is used to prevent the forming of new blood clots. You can work with your health care provider to choose the treatment program that is best for you. What are anticoagulants? Anticoagulants are medicines that treat PEs. They can stop current blood clots from growing and stop new clots from forming. They cannot dissolve existing clots. Your body dissolves clots by itself over time. Anticoagulants are given by mouth, by injection, or through an IV tube. What are thrombolytics? Thrombolytics are clot-dissolving medicines that are used to dissolve a PE. They carry a high risk of bleeding, so they tend to be used only in severe cases or if you have very low blood pressure. HOME CARE INSTRUCTIONS If you are taking a newer oral anticoagulant:  Take the medicine every single day at the same time each day.  Understand what foods and drugs interact with this medicine.  Understand that there are no regular blood tests required when using this medicine.  Understandthe side effects of this medicine, including excessive bruising or bleeding. Ask your health care provider or pharmacist about other possible side effects. If you are taking warfarin:  Understand how to take warfarin and know which foods can affect how warfarin works in Veterinary surgeon.  Understand that it is dangerous to taketoo much or too little warfarin. Too much warfarin increases the risk of bleeding. Too little warfarin continues to allow the risk for blood clots.  Follow your PT and INR blood testing schedule. The PT and INR results allow your health care provider to adjust your dose of warfarin. It is very important that you have your PT and INR tested as often as told by your health care provider.  Avoid major changes in your diet, or tell your health care provider before you change your diet. Arrange a visit with a registered dietitian to answer your  questions. Many foods, especially foods that are high in vitamin K, can interfere with warfarin and affect the PT and INR results. Eat a consistent amount of foods that are high in vitamin K, such as:  Spinach, kale, broccoli, cabbage, collard greens, turnip greens, Brussels sprouts, peas, cauliflower, seaweed, and parsley.  Beef liver and pork liver.  Green tea.  Soybean oil.  Tell your health care provider about any and all medicines, vitamins, and supplements that you take, including aspirin and other over-the-counter anti-inflammatory medicines. Be especially cautious with aspirin and anti-inflammatory medicines. Do not take those before you ask your health care provider if it is safe to do so. This is important because many medicines can interfere with warfarin and affect the PT and INR results.  Do not start or stop taking any over-the-counter or prescription medicine unless your health care provider or pharmacist tells you to do so. If you take warfarin, you will also need to do these things:  Hold pressure over cuts for longer than usual.  Tell your dentist and other health care providers that you are taking warfarin before you have any procedures in which bleeding may occur.  Avoid alcohol or drink very small amounts. Tell your health care  provider if you change your alcohol intake.  Do not use tobacco products, including cigarettes, chewing tobacco, and e-cigarettes. If you need help quitting, ask your health care provider.  Avoid contact sports. General Instructions  Take over-the-counter and prescription medicines only as told by your health care provider. Anticoagulant medicines can have side effects, including easy bruising and difficulty stopping bleeding. If you are prescribed an anticoagulant, you will also need to do these things:  Hold pressure over cuts for longer than usual.  Tell your dentist and other health care providers that you are taking anticoagulants  before you have any procedures in which bleeding may occur.  Avoid contact sports.  Wear a medical alert bracelet or carry a medical alert card that says you have had a PE.  Ask your health care provider how soon you can go back to your normal activities. Stay active to prevent new blood clots from forming.  Make sure to exercise while traveling or when you have been sitting or standing for a long period of time. It is very important to exercise. Exercise your legs by walking or by tightening and relaxing your leg muscles often. Take frequent walks.  Wear compression stockings as told by your health care provider to help prevent more blood clots from forming.  Do not use tobacco products, including cigarettes, chewing tobacco, and e-cigarettes. If you need help quitting, ask your health care provider.  Keep all follow-up appointments with your health care provider. This is important. PREVENTION Take these actions to decrease your risk of developing another PE:  Exercise regularly. For at least 30 minutes every day, engage in:  Activity that involves moving your arms and legs.  Activity that encourages good blood flow through your body by increasing your heart rate.  Exercise your arms and legs every hour during long-distance travel (over 4 hours). Drink plenty of water and avoid drinking alcohol while traveling.  Avoid sitting or lying in bed for long periods of time without moving your legs.  Maintain a weight that is appropriate for your height. Ask your health care provider what weight is healthy for you.  If you are a woman who is over 93 years of age, avoid unnecessary use of medicines that contain estrogen. These include birth control pills.  Do not smoke, especially if you take estrogen medicines. If you need help quitting, ask your health care provider.  If you are at very high risk for PE, wear compression stockings.  If you recently had a PE, have regularly scheduled  ultrasound testing on your legs to check for new blood clots. If you are hospitalized, prevention measures may include:  Early walking after surgery, as soon as your health care provider says that it is safe.  Receiving anticoagulants to prevent blood clots. If you cannot take anticoagulants, other options may be available, such as wearing compression stockings or using different types of devices. SEEK IMMEDIATE MEDICAL CARE IF:  You have new or increased pain, swelling, or redness in an arm or leg.  You have numbness or tingling in an arm or leg.  You have shortness of breath while active or at rest.  You have chest pain.  You have a rapid or irregular heartbeat.  You feel light-headed or dizzy.  You cough up blood.  You notice blood in your vomit, bowel movement, or urine.  You have a fever. These symptoms may represent a serious problem that is an emergency. Do not wait to see if the symptoms  will go away. Get medical help right away. Call your local emergency services (911 in the [place] Do not drive yourself to the hospital.   This information is not intended to replace advice given to you by your health care provider. Make sure you discuss any questions you have with your health care provider.   Document Released: 07/15/2000 Document Revised: 04/08/2015 Document Reviewed: 11/12/2014 Elsevier Interactive Patient Education 2016 [place] on my medicine - ELIQUIS (apixaban)  This medication education was reviewed with me or my healthcare representative as part of my discharge preparation.  The pharmacist that spoke with me during my hospital stay was:  Laqueta Jean  Why was Eliquis prescribed for you? Eliquis was prescribed to treat blood clots that may have been found in the veins of your legs (deep vein thrombosis) or in your lungs (pulmonary embolism) and to reduce the risk of them occurring again.  What do You need to know about Eliquis ? The  starting dose is 10 mg (two 5 mg tablets) taken TWICE daily for the FIRST SEVEN (7) DAYS, then on (enter date)  03/17/2016  the dose is reduced to ONE 5 mg tablet taken TWICE daily.  Eliquis may be taken with or without food.   Try to take the dose about the same time in the morning and in the evening. If you have difficulty swallowing the tablet whole please discuss with your pharmacist how to take the medication safely.  Take Eliquis exactly as prescribed and DO NOT stop taking Eliquis without talking to the doctor who prescribed the medication.  Stopping may increase your risk of developing a new blood clot.  Refill your prescription before you run out.  After discharge, you should have regular check-up appointments with your healthcare provider that is prescribing your Eliquis.    What do you do if you miss a dose? If a dose of ELIQUIS is not taken at the scheduled time, take it as soon as possible on the same day and twice-daily administration should be resumed. The dose should not be doubled to make up for a missed dose.  Important Safety Information A possible side effect of Eliquis is bleeding. You should call your healthcare provider right away if you experience any of the following: ? Bleeding from an injury or your nose that does not stop. ? Unusual colored urine (red or dark brown) or unusual colored stools (red or black). ? Unusual bruising for unknown reasons. ? A serious fall or if you hit your head (even if there is no bleeding).  Some medicines may interact with Eliquis and might increase your risk of bleeding or clotting while on Eliquis. To help avoid this, consult your healthcare provider or pharmacist prior to using any new prescription or non-prescription medications, including herbals, vitamins, non-steroidal anti-inflammatory drugs (NSAIDs) and supplements.  This website has more information on Eliquis (apixaban): [email or web address]://[email or web address].com/eliquis/home

## 2016-03-10 NOTE — Progress Notes (Signed)
Initial Nutrition Assessment  DOCUMENTATION CODES:   Obesity unspecified  INTERVENTION:   Encourage PO intake RD will monitor for additional needs  NUTRITION DIAGNOSIS:   Inadequate oral intake related to poor appetite as evidenced by per patient/family report.  GOAL:   Patient will meet greater than or equal to 90% of their needs  MONITOR:   PO intake, Labs, I & O's, Weight trends  REASON FOR ASSESSMENT:   Malnutrition Screening Tool    ASSESSMENT:   60 y.o. female with medical history significant of s/p of recent left hip replacement on 02/01/16, GERD, hypothyroidism, fibromyalgia, arthritis, conjunctivitis, who presents with chest pain, shortness of breath and left leg swelling.  Pt reports poor appetite since yesterday when she started having a fever and feeling sick. Per pt's husband in the room, pt had not eaten well for 3 days PTA. PT ate some lunch today, ~ 50% meal completion. Pt states that she has lost 10 lb since her hip replacement surgery in July, this is a 4% wt loss x 1 month which is insignificant for time frame.  Reviewed protein food options with patient.  Nutrition focused physical exam shows no sign of depletion of muscle mass or body fat.  Labs reviewed. Medications: Multivitamin with minerals daily  Diet Order:  Diet regular Room service appropriate? Yes; Fluid consistency: Thin  Skin:  Reviewed, no issues  Last BM:  8/9  Height:   Ht Readings from Last 1 Encounters:  03/09/16 5\' 4"  (1.626 m)    Weight:   Wt Readings from Last 1 Encounters:  03/09/16 198 lb 10.2 oz (90.1 kg)    Ideal Body Weight:  54.5 kg  BMI:  Body mass index is 34.1 kg/m.  Estimated Nutritional Needs:   Kcal:  1600-1800  Protein:  65-75g  Fluid:  1.8L/day  EDUCATION NEEDS:   Education needs addressed  Clayton Bibles, MS, RD, LDN Pager: [phone] After Hours Pager: [phone]

## 2016-03-11 ENCOUNTER — Ambulatory Visit (HOSPITAL_COMMUNITY): Payer: BLUE CROSS/BLUE SHIELD

## 2016-03-11 DIAGNOSIS — E039 Hypothyroidism, unspecified: Secondary | ICD-10-CM

## 2016-03-11 DIAGNOSIS — R509 Fever, unspecified: Secondary | ICD-10-CM

## 2016-03-11 LAB — CBC
HCT: 31 % — ABNORMAL LOW (ref 36.0–46.0)
Hemoglobin: 9.6 g/dL — ABNORMAL LOW (ref 12.0–15.0)
MCH: 25.4 pg — ABNORMAL LOW (ref 26.0–34.0)
MCHC: 31 g/dL (ref 30.0–36.0)
MCV: 82 fL (ref 78.0–100.0)
Platelets: 273 10*3/uL (ref 150–400)
RBC: 3.78 MIL/uL — ABNORMAL LOW (ref 3.87–5.11)
RDW: 14.1 % (ref 11.5–15.5)
WBC: 8.3 10*3/uL (ref 4.0–10.5)

## 2016-03-11 LAB — GLUCOSE, CAPILLARY: Glucose-Capillary: 90 mg/dL (ref 65–99)

## 2016-03-11 MED ORDER — APIXABAN 5 MG PO TABS: 5.0000 mg | ORAL_TABLET | Freq: Two times a day (BID) | ORAL | 0 refills | Status: DC

## 2016-03-11 MED ORDER — APIXABAN 5 MG PO TABS: 10.0000 mg | ORAL_TABLET | Freq: Two times a day (BID) | ORAL | 0 refills | Status: DC

## 2016-03-11 MED ORDER — HYDROCODONE-ACETAMINOPHEN 7.5-325 MG PO TABS: 2.0000 | ORAL_TABLET | ORAL | 0 refills | Status: DC | PRN

## 2016-03-11 NOTE — Progress Notes (Signed)
Discharge instructions reviewed with patient and husband, both verbalized understanding. Patient to be discharged via private vehicle.

## 2016-03-11 NOTE — Discharge Summary (Addendum)
Physician Discharge Summary  Wendy Marshall [id number] DOB: Sep 17, 1955 DOA: 03/09/2016  PCP: Sallee Lange, MD  Admit date: 03/09/2016 Discharge date: 03/11/2016  Admitted From: Home Disposition:  Home  Recommendations for Outpatient Follow-up:  1. Follow up with PCP in one week. Patient is being discharged on Eliquis for acute pulmonary embolism with duration of therapy for 6 months.  Home Health: None Equipment/Devices: None  Discharge Condition: Fair CODE STATUS: Full code Diet recommendation: Regular     Discharge Diagnoses:  Principal Problem:   Acute PE (pulmonary embolism)  Active Problems:   Hypothyroidism   Fibromyalgia   Status post total replacement of left hip   Leukocytosis   Fever  Brief narrative/history of present illness 59 year old obese female with recent left hip replacement done on 02/01/2016 (received 3- 4 weeks of full dose aspirin for DVT prophylaxis postop), GERD, hypothyroidism, fibromyalgia, presented with substernal chest pain with shortness of breath and left leg swelling. Patient informs developing left leg swelling and pain after surgery and had a Doppler of her left lower extremity done which was negative for DVT. The swelling subsequently improved. On the day of admission she started having chest pain with shortness of breath, nonradiating aggravated with deep breathing. In the ED she had a white count of 14.7 K, normal hemoglobin and electrolytes. CT angiogram of the chest showed bilateral PE involving upper and lower lobe branches without heart strain.  Hospital course Principal Problem:   Acute bilateral PE (pulmonary embolism) (HCC) Likely contributed by lower extremity DVT following recent left hip replacement. Placed on IV heparin on admission. Doppler left lower extremity negative for DVT and 2-D echo showed normal EF with no right heart strain. Switch to oral Eliquis. Plan to treat for at least 6 months. Patient complains of some  chest discomfort and some dyspnea on exertion however her O2 sat is stable on room air Both at rest and ambulation. Heart rate is also stable with exertion. Patient is stable to be discharged home with outpatient PCP follow-up. Provided instructions on side effects although oral anticoagulation. Care instructions provided on complications of acute PE and alarming signs or symptoms.   Active Problems: Chest pain with pleurisy Requiring IV morphine and Vicodin for pain control. Symptoms better. We will discharge her on some pain medications.     Status post total replacement of left hip Stable and ambulating     Leukocytosis and low-grade fever Secondary to acute PE. No clinical signs or symptoms of infection.  Hypothyroidism Continue Synthroid.       Family Communication  :  husband at bedside  Disposition Plan  : Home     Consults  :  None  Procedures  :  CT angiogram of the chest 2-D echo  Discharge Instructions     Medication List    STOP taking these medications   methocarbamol 500 MG tablet Commonly known as:  ROBAXIN   mometasone 0.1 % cream Commonly known as:  ELOCON     TAKE these medications   apixaban 5 MG Tabs tablet Commonly known as:  ELIQUIS Take 2 tablets (10 mg total) by mouth 2 (two) times daily.   apixaban 5 MG Tabs tablet Commonly known as:  ELIQUIS Take 1 tablet (5 mg total) by mouth 2 (two) times daily. Start taking on:  03/17/2016   diazepam 5 MG tablet Commonly known as:  VALIUM Take 1 tablet (5 mg total) by mouth every 6 (six) hours as needed for muscle spasms.  HYDROcodone-acetaminophen 7.5-325 MG tablet Commonly known as:  NORCO Take 2 tablets by mouth every 4 (four) hours as needed (breakthrough pain). What changed:  how much to take  Another medication with the same name was removed. Continue taking this medication, and follow the directions you see here.   ICY HOT 5 % Ptch Generic drug:   Menthol Place 1 patch onto the skin as needed (For pain.).   levothyroxine 100 MCG tablet Commonly known as:  SYNTHROID, LEVOTHROID TAKE ONE TABLET BY MOUTH ONCE DAILY What changed:  See the new instructions.   loratadine 10 MG tablet Commonly known as:  CLARITIN Take 10 mg by mouth daily as needed for allergies.   multivitamin with minerals Tabs tablet Take 1 tablet by mouth daily.   sulfacetamide 10 % ophthalmic solution Commonly known as:  BLEPH-10 Place 1 drop into both eyes every 2 (two) hours while awake. Today; Then QID starting tomorrow      Follow-up Information    Sallee Lange, MD. Schedule an appointment as soon as possible for a visit in 1 week(s).   Specialty:  Family Medicine Contact information: [place] [address] [phone]          Allergies  Allergen Reactions  . Penicillins Hives, Shortness Of Breath and Other (See Comments)    Childhood reaction was told her reaction may have been shortness of breath and hives Has patient had a PCN reaction causing immediate rash, facial/tongue/throat swelling, SOB or lightheadedness with hypotension: yes Has patient had a PCN reaction causing severe rash involving mucus membranes or skin necrosis: no Has patient had a PCN reaction that required hospitalization no Has patient had a PCN reaction occurring within the last 10 years: no If all of the above answers are "NO", then may proceed   . Flexeril [Cyclobenzaprine] Other (See Comments)    Heart problems - was told she should probably avoid all muscle relaxers   . Soma [Carisoprodol] Other (See Comments)    Heart problems - was told she should probably avoid all muscle relaxers    Consultations: None   Procedures/Studies: Dg Chest 2 View  Result Date: 03/09/2016 CLINICAL DATA:  Onset of right chest pain and SOB today - pt states she can not take in a deep breath - states two weeks ago her left leg suddenly became swollen, she  was seen in ER and treated for DVT - hx left total hip replacement @ 5 weeks agp EXAM: CHEST - 2 VIEW COMPARISON:  none FINDINGS: Low lung volumes with some crowding of perihilar and bibasilar bronchovascular structures. No focal infiltrate or overt edema. The heart size upper limits normal, probably emphasized by low lung volumes. No effusion or pneumothorax. Cervical fixation hardware noted. IMPRESSION: Low volumes.  No acute disease. Electronically Signed   By: Lucrezia Europe M.D.   On: 03/09/2016 18:59   Ct Angio Chest Pe W And/or Wo Contrast  Result Date: 03/09/2016 CLINICAL DATA:  Right-sided chest pain. Shortness of breath. Symptoms started this morning. EXAM: CT ANGIOGRAPHY CHEST WITH CONTRAST TECHNIQUE: Multidetector CT imaging of the chest was performed using the standard protocol during bolus administration of intravenous contrast. Multiplanar CT image reconstructions and MIPs were obtained to evaluate the vascular anatomy. CONTRAST:  100 cc Isovue 370 COMPARISON:  03/09/2016 FINDINGS: Heart: Heart size is normal. No imaged pericardial effusion or significant coronary artery calcifications. The RV/LV ratio is normal. Vascular structures: There is significant pulmonary embolus, involving upper and lower lobe  branches bilaterally. Aortic arch is normal in appearance. Mediastinum/thyroid: The visualized portion of the thyroid gland has a normal appearance. No mediastinal, hilar, or axillary adenopathy. Small hiatal hernia is noted. Otherwise the esophagus is normal in appearance. Lungs/Airways: There is right lower lobe atelectasis and a small right pleural effusion. Small amount atelectasis is also identified at the left lung base. Upper abdomen: Unremarkable. Chest wall/osseous structures: Moderate degenerative changes in the thoracic spine. Prior cervical fusion. Review of the MIP images confirms the above findings. IMPRESSION: 1. Bilateral pulmonary emboli, involving upper and lower lobe branches  bilaterally. No evidence for right heart strain. 2. Bilateral atelectasis.  Small right pleural effusion. 3. Small hiatal hernia. 4. Critical Value/emergent results were called by telephone at the time of interpretation on 03/09/2016 at 8:23 pm to Dr. Shary Decamp , who verbally acknowledged these results. Electronically Signed   By: Nolon Nations M.D.   On: 03/09/2016 20:24   US Venous Img Lower Unilateral Left  Result Date: 02/15/2016 CLINICAL DATA:  Left lower extremity pain and swelling. EXAM: LEFT LOWER EXTREMITY VENOUS DUPLEX ULTRASOUND TECHNIQUE: Doppler venous assessment of the left lower extremity deep venous system was performed, including characterization of spectral flow, compressibility, and phasicity. COMPARISON:  None. FINDINGS: There is complete compressibility of the common femoral, femoral, and popliteal veins. Doppler analysis demonstrates respiratory phasicity and augmentation of flow with calf compression. No obvious superficial vein or calf vein thrombosis. IMPRESSION: No evidence of DVT. Electronically Signed   By: Marybelle Killings M.D.   On: 02/15/2016 11:36    2-D echo (03/10/2016) Study Conclusions  - Left ventricle: The cavity size was normal. There was mild   concentric hypertrophy. Systolic function was normal. The   estimated ejection fraction was in the range of 55% to 60%. Wall   motion was normal; there were no regional wall motion   abnormalities. Doppler parameters are consistent with abnormal   left ventricular relaxation (grade 1 diastolic dysfunction).   Doppler parameters are consistent with indeterminate ventricular   filling pressure. - Aortic valve: Transvalvular velocity was within the normal range.   There was no stenosis. There was no regurgitation. - Mitral valve: Transvalvular velocity was within the normal range.   There was no evidence for stenosis. There was no regurgitation. - Left atrium: The atrium was moderately dilated. - Right ventricle: The  cavity size was normal. Wall thickness was   normal. Hypokinises of the right ventricular apex. - Pulmonary arteries: Systolic pressure was within the normal   range. PA peak pressure: 33 mm Hg (S).  Subjective: Complains of some chest discomfort and minimal dyspnea on exertion. Improved from yesterday.  fever of 100.76F yesterday.  Discharge Exam: Vitals:   03/11/16 0000 03/11/16 0420  BP:  111/61  Pulse:  75  Resp:  20  Temp: 98.9 F (37.2 C) 99 F (37.2 C)   Vitals:   03/10/16 1700 03/10/16 2235 03/11/16 0000 03/11/16 0420  BP: (!) 105/53 (!) 111/52  111/61  Pulse:  92  75  Resp: 20 20  20   Temp: 99.1 F (37.3 C) (!) 100.9 F (38.3 C) 98.9 F (37.2 C) 99 F (37.2 C)  TempSrc: Oral Oral Oral Oral  SpO2: 95% 96%  98%  Weight:      Height:        Gen: Middle aged female not in distress HEENT: no pallor, moist mucosa, supple neck Chest: clear b/l, no added sounds, reproducible pain on pressure over her substernal area CVS:  N S1&S2, no murmurs, rubs or gallop GI: soft, NT, ND, BS+ Musculoskeletal: warm, no edema or swelling       The results of significant diagnostics from this hospitalization (including imaging, microbiology, ancillary and laboratory) are listed below for reference.     Microbiology: No results found for this or any previous visit (from the past 240 hour(s)).   Labs: BNP (last 3 results) No results for input(s): BNP in the last 8760 hours. Basic Metabolic Panel:  Recent Labs Lab 03/09/16 1849 03/10/16 0244  NA 136 138  K 3.8 4.0  CL 103 106  CO2 25 26  GLUCOSE 117* 119*  BUN 8 8  CREATININE 0.89 0.70  CALCIUM 9.2 8.6*   Liver Function Tests: No results for input(s): AST, ALT, ALKPHOS, BILITOT, PROT, ALBUMIN in the last 168 hours. No results for input(s): LIPASE, AMYLASE in the last 168 hours. No results for input(s): AMMONIA in the last 168 hours. CBC:  Recent Labs Lab 03/09/16 1849 03/10/16 0244 03/11/16 0443  WBC  14.7* 11.7* 8.3  HGB 12.5 10.2* 9.6*  HCT 39.4 34.1* 31.0*  MCV 80.7 81.2 82.0  PLT 330 285 273   Cardiac Enzymes: No results for input(s): CKTOTAL, CKMB, CKMBINDEX, TROPONINI in the last 168 hours. BNP: Invalid input(s): POCBNP CBG:  Recent Labs Lab 03/10/16 0813 03/11/16 0852  GLUCAP 87 90   D-Dimer No results for input(s): DDIMER in the last 72 hours. Hgb A1c No results for input(s): HGBA1C in the last 72 hours. Lipid Profile No results for input(s): CHOL, HDL, LDLCALC, TRIG, CHOLHDL, LDLDIRECT in the last 72 hours. Thyroid function studies No results for input(s): TSH, T4TOTAL, T3FREE, THYROIDAB in the last 72 hours.  Invalid input(s): FREET3 Anemia work up No results for input(s): VITAMINB12, FOLATE, FERRITIN, TIBC, IRON, RETICCTPCT in the last 72 hours. Urinalysis    Component Value Date/Time   COLORURINE YELLOW 03/10/2016 0301   APPEARANCEUR CLEAR 03/10/2016 0301   LABSPEC 1.038 (H) 03/10/2016 0301   PHURINE 6.0 03/10/2016 0301   GLUCOSEU NEGATIVE 03/10/2016 0301   HGBUR NEGATIVE 03/10/2016 0301   BILIRUBINUR NEGATIVE 03/10/2016 0301   KETONESUR NEGATIVE 03/10/2016 0301   PROTEINUR NEGATIVE 03/10/2016 0301   NITRITE NEGATIVE 03/10/2016 0301   LEUKOCYTESUR NEGATIVE 03/10/2016 0301   Sepsis Labs Invalid input(s): PROCALCITONIN,  WBC,  LACTICIDVEN Microbiology No results found for this or any previous visit (from the past 240 hour(s)).   Time coordinating discharge: < 30 minutes  SIGNED:   Louellen Molder, MD  [facility] 03/11/2016, 11:53 AM Pager   If 7PM-7AM, please contact night-coverage [email or web address] Password [email or web address]

## 2016-03-14 ENCOUNTER — Ambulatory Visit (INDEPENDENT_AMBULATORY_CARE_PROVIDER_SITE_OTHER): Payer: BLUE CROSS/BLUE SHIELD | Admitting: Family Medicine

## 2016-03-14 ENCOUNTER — Telehealth (HOSPITAL_COMMUNITY): Payer: Self-pay

## 2016-03-14 ENCOUNTER — Ambulatory Visit (HOSPITAL_COMMUNITY): Payer: BLUE CROSS/BLUE SHIELD

## 2016-03-14 ENCOUNTER — Encounter: Payer: Self-pay | Admitting: Family Medicine

## 2016-03-14 VITALS — BP 110/68 | HR 94 | Temp 98.4°F | Ht 64.0 in | Wt 199.8 lb

## 2016-03-14 DIAGNOSIS — I2699 Other pulmonary embolism without acute cor pulmonale: Secondary | ICD-10-CM | POA: Diagnosis not present

## 2016-03-14 DIAGNOSIS — E038 Other specified hypothyroidism: Secondary | ICD-10-CM | POA: Diagnosis not present

## 2016-03-14 LAB — POCT HEMOGLOBIN: Hemoglobin: 12.1 g/dL — AB (ref 12.2–16.2)

## 2016-03-14 MED ORDER — APIXABAN 5 MG PO TABS: 5.0000 mg | ORAL_TABLET | Freq: Two times a day (BID) | ORAL | 0 refills | Status: DC

## 2016-03-14 NOTE — Progress Notes (Signed)
   Subjective:    Patient ID: Wendy Marshall, female    DOB: [birth year], 60 y.o.   MRN: [id number]  HPIFollow up Pulmonary Embolism. Pt taking eliquis.   Still having shortness of breath and chest pain.  Cough, fever, headache. Not taking any treatments.  In-depth review of her hospital records was completed. Patient recently diagnosed with acute pulmonary embolus. Patient recently had symptoms of DVT but an ultrasound was negative. Recent orthopedic intervention  Patient had had sharp pains in the chest which are now start get better shortness of breath with activity which is still present fatigue and tiredness. Tolerating Eliquis. Not having any bleeding symptoms. Never had a history of blood clots. No family history of blood clots. Patient relates unable to work unable to do physical therapy currently.   Review of Systems     Objective:   Physical Exam Neck no masses lungs are clear no crackles heart is regular pulses normal extremities no edema skin warm dry respiratory rate normal heart in the upper 80s regular O2 saturation 98%       Assessment & Plan:  New onset acute pulmonary embolus-I don't feel she needs any type of scan or extra testing currently I do recommend lab work to look at her CBC. Also recommend continuing Eliquis 5 mg twice daily. Warning signs regarding bleeding was discussed in detail. The benefits of treatment outweigh the risk. I also believe this patient would be best served by being seen by hematology. More than likely will need 6 months of blood thinner. It is felt that this pulmonary embolus was triggered by orthopedic procedure in which was done recently but there could still be a underlying secondary issue that increase the risk of blood clots given the seriousness of her issue consultation with hematology reasonable. May or may not need to be on long-term anticoagulation. Greater and 25 minutes spent with the patient. Recheck in one week.

## 2016-03-14 NOTE — Telephone Encounter (Signed)
Husband cx this weeks appts.  He said that she has blood clots in her lungs.  He will call back next week to let us know about future appts.

## 2016-03-15 ENCOUNTER — Other Ambulatory Visit: Payer: Self-pay

## 2016-03-15 DIAGNOSIS — I2699 Other pulmonary embolism without acute cor pulmonale: Secondary | ICD-10-CM

## 2016-03-15 LAB — BASIC METABOLIC PANEL
BUN/Creatinine Ratio: 10 (ref 9–23)
BUN: 10 mg/dL (ref 6–24)
CO2: 23 mmol/L (ref 18–29)
Calcium: 9.3 mg/dL (ref 8.7–10.2)
Chloride: 99 mmol/L (ref 96–106)
Creatinine, Ser: 1.01 mg/dL — ABNORMAL HIGH (ref 0.57–1.00)
GFR calc Af Amer: 71 mL/min/{1.73_m2} (ref >59)
GFR calc non Af Amer: 62 mL/min/{1.73_m2} (ref >59)
Glucose: 84 mg/dL (ref 65–99)
Potassium: 4.5 mmol/L (ref 3.5–5.2)
Sodium: 141 mmol/L (ref 134–144)

## 2016-03-15 LAB — CBC WITH DIFFERENTIAL/PLATELET
Basophils Absolute: 0 10*3/uL (ref 0.0–0.2)
Basos: 1 %
EOS (ABSOLUTE): 0.3 10*3/uL (ref 0.0–0.4)
Eos: 4 %
Hematocrit: 38.2 % (ref 34.0–46.6)
Hemoglobin: 12.2 g/dL (ref 11.1–15.9)
Immature Grans (Abs): 0 10*3/uL (ref 0.0–0.1)
Immature Granulocytes: 0 %
Lymphocytes Absolute: 2.1 10*3/uL (ref 0.7–3.1)
Lymphs: 31 %
MCH: 24.8 pg — ABNORMAL LOW (ref 26.6–33.0)
MCHC: 31.9 g/dL (ref 31.5–35.7)
MCV: 78 fL — ABNORMAL LOW (ref 79–97)
Monocytes Absolute: 0.4 10*3/uL (ref 0.1–0.9)
Monocytes: 6 %
Neutrophils Absolute: 3.8 10*3/uL (ref 1.4–7.0)
Neutrophils: 58 %
Platelets: 496 10*3/uL — ABNORMAL HIGH (ref 150–379)
RBC: 4.91 x10E6/uL (ref 3.77–5.28)
RDW: 14.7 % (ref 12.3–15.4)
WBC: 6.6 10*3/uL (ref 3.4–10.8)

## 2016-03-15 LAB — TSH: TSH: 2.09 u[IU]/mL (ref 0.450–4.500)

## 2016-03-16 ENCOUNTER — Encounter (HOSPITAL_COMMUNITY): Payer: Self-pay | Admitting: Emergency Medicine

## 2016-03-16 ENCOUNTER — Emergency Department (HOSPITAL_COMMUNITY)
Admission: EM | Admit: 2016-03-16 | Discharge: 2016-03-16 | Disposition: A | Discharge status: Home / Self Care | Payer: BLUE CROSS/BLUE SHIELD | Attending: Emergency Medicine | Admitting: Emergency Medicine

## 2016-03-16 ENCOUNTER — Telehealth: Payer: Self-pay

## 2016-03-16 ENCOUNTER — Telehealth: Payer: Self-pay | Admitting: Family Medicine

## 2016-03-16 ENCOUNTER — Encounter (HOSPITAL_COMMUNITY): Payer: BLUE CROSS/BLUE SHIELD | Admitting: Physical Therapy

## 2016-03-16 ENCOUNTER — Emergency Department (HOSPITAL_COMMUNITY): Payer: BLUE CROSS/BLUE SHIELD

## 2016-03-16 ENCOUNTER — Encounter: Payer: Self-pay | Admitting: Family Medicine

## 2016-03-16 ENCOUNTER — Other Ambulatory Visit: Payer: Self-pay

## 2016-03-16 DIAGNOSIS — R042 Hemoptysis: Secondary | ICD-10-CM | POA: Insufficient documentation

## 2016-03-16 DIAGNOSIS — Z79891 Long term (current) use of opiate analgesic: Secondary | ICD-10-CM | POA: Diagnosis not present

## 2016-03-16 DIAGNOSIS — Z79899 Other long term (current) drug therapy: Secondary | ICD-10-CM | POA: Insufficient documentation

## 2016-03-16 DIAGNOSIS — E039 Hypothyroidism, unspecified: Secondary | ICD-10-CM | POA: Diagnosis not present

## 2016-03-16 DIAGNOSIS — Z87891 Personal history of nicotine dependence: Secondary | ICD-10-CM | POA: Insufficient documentation

## 2016-03-16 DIAGNOSIS — R0602 Shortness of breath: Secondary | ICD-10-CM | POA: Diagnosis present

## 2016-03-16 LAB — CBC WITH DIFFERENTIAL/PLATELET
Basophils Absolute: 0 10*3/uL (ref 0.0–0.1)
Basophils Relative: 1 %
Eosinophils Absolute: 0.3 10*3/uL (ref 0.0–0.7)
Eosinophils Relative: 5 %
HCT: 37.3 % (ref 36.0–46.0)
Hemoglobin: 11.4 g/dL — ABNORMAL LOW (ref 12.0–15.0)
Lymphocytes Relative: 33 %
Lymphs Abs: 1.7 10*3/uL (ref 0.7–4.0)
MCH: 24.7 pg — ABNORMAL LOW (ref 26.0–34.0)
MCHC: 30.6 g/dL (ref 30.0–36.0)
MCV: 80.9 fL (ref 78.0–100.0)
Monocytes Absolute: 0.3 10*3/uL (ref 0.1–1.0)
Monocytes Relative: 6 %
Neutro Abs: 2.9 10*3/uL (ref 1.7–7.7)
Neutrophils Relative %: 55 %
Platelets: 485 10*3/uL — ABNORMAL HIGH (ref 150–400)
RBC: 4.61 MIL/uL (ref 3.87–5.11)
RDW: 13.9 % (ref 11.5–15.5)
WBC: 5.2 10*3/uL (ref 4.0–10.5)

## 2016-03-16 LAB — COMPREHENSIVE METABOLIC PANEL
ALT: 14 U/L (ref 14–54)
AST: 14 U/L — ABNORMAL LOW (ref 15–41)
Albumin: 3.7 g/dL (ref 3.5–5.0)
Alkaline Phosphatase: 80 U/L (ref 38–126)
Anion gap: 6 (ref 5–15)
BUN: 10 mg/dL (ref 6–20)
CO2: 26 mmol/L (ref 22–32)
Calcium: 9.1 mg/dL (ref 8.9–10.3)
Chloride: 106 mmol/L (ref 101–111)
Creatinine, Ser: 0.83 mg/dL (ref 0.44–1.00)
GFR calc Af Amer: >60 mL/min (ref >60)
GFR calc non Af Amer: >60 mL/min (ref >60)
Glucose, Bld: 92 mg/dL (ref 65–99)
Potassium: 4.3 mmol/L (ref 3.5–5.1)
Sodium: 138 mmol/L (ref 135–145)
Total Bilirubin: 0.4 mg/dL (ref 0.3–1.2)
Total Protein: 7.7 g/dL (ref 6.5–8.1)

## 2016-03-16 LAB — BRAIN NATRIURETIC PEPTIDE: B Natriuretic Peptide: 11.6 pg/mL (ref 0.0–100.0)

## 2016-03-16 LAB — TROPONIN I: Troponin I: <0.03 ng/mL (ref <0.03)

## 2016-03-16 MED ORDER — IOPAMIDOL (ISOVUE-370) INJECTION 76%
100.0000 mL | Freq: Once | INTRAVENOUS | Status: AC | PRN
  Administered 2016-03-16: 100 mL via INTRAVENOUS

## 2016-03-16 MED ORDER — DIPHENHYDRAMINE HCL 50 MG/ML IJ SOLN
25.0000 mg | Freq: Once | INTRAMUSCULAR | Status: AC
  Administered 2016-03-16: 25 mg via INTRAVENOUS
  Filled 2016-03-16: qty 1

## 2016-03-16 NOTE — Telephone Encounter (Signed)
Husband called and stated that patient is coughing up blood clots and she recently had pulmonary embolisms and is taking eliquis daily. Discussed with Dr. Mickie Hillier and advised husband per doctor that patient needs to be assessed at the nearest emergency room as soon as possible. Husband verbalized understanding and stated that he will take patient to [facility].

## 2016-03-16 NOTE — ED Notes (Signed)
She returns from CT at this time.  She c/o some itching near sacrum, at which she has a few small urticaria.  Her skin is normal, warm and dry and she is breathing normally. BBS clear per auscultation.  Order rec'd. For Benadryl IV, which is given.

## 2016-03-16 NOTE — Telephone Encounter (Signed)
ER saw patient and recommended they see pulmonology. Please make sure that family is calling pulmonology for follow-up-if they end up needing a referral let us know-patient was sent home from the ER

## 2016-03-16 NOTE — ED Triage Notes (Signed)
Pt was d/c from hospital last week after being treated for bilateral PEs. Pt has been feeling SOB ever since and then this morning pt sts she coughed up three dark red blood clots. Pt Denies worsening SOB. Pt on Eloquis, did not take her morning dose. Pt A&Ox4 and ambulatory. Pt also c/o chest pain ever since D/c from hospital.

## 2016-03-16 NOTE — Telephone Encounter (Signed)
error 

## 2016-03-16 NOTE — ED Provider Notes (Signed)
[facility] DEPT Provider Note   CSN: [id number] Arrival date & time: 03/16/16  [id number]     History   Chief Complaint Chief Complaint  Patient presents with  . Hemoptysis  . Shortness of Breath    HPI Wendy Marshall is a 60 y.o. female.  HPI Patient with a notable history of recent ulnar embolism presents with new hemoptysis, dyspnea, chest pain. Patient has history of left hip replacement, with subsequent DVT, PE development. She was discharged from this facility one week ago, with Elliquis. She notes that since discharge she has had persistent dyspnea, chest pain, worse over the past day. Today, the patient 3 episodes of hemoptysis, has no lightheadedness, no syncopal, no extremity weakness, no abdominal pain, no nausea, no vomiting. She does describe generalized weakness, chronic.  Past Medical History:  Diagnosis Date  . Arthritis   . Bilateral conjunctivitis    seen by PCP on 01/06/16, patient states Dr Alvan Dame aware   . Complication of anesthesia    slow to wake up with tonsils and tubal ligation   . Fibromyalgia   . GERD (gastroesophageal reflux disease)    occasional   . Hypothyroidism   . PONV (postoperative nausea and vomiting)     Patient Active Problem List   Diagnosis Date Noted  . PE (pulmonary embolism) 03/09/2016  . Leukocytosis 03/09/2016  . Fever 03/09/2016  . Status post total replacement of left hip 02/01/2016  . Left-sided low back pain with left-sided sciatica 08/06/2015  . Hypothyroidism 08/05/2013  . Fibromyalgia 08/05/2013    Past Surgical History:  Procedure Laterality Date  . CARPAL TUNNEL RELEASE    . EYE SURGERY    . MANDIBLE SURGERY    . ROOT CANAL    . SHOULDER SURGERY    . TONSILLECTOMY    . TOTAL HIP ARTHROPLASTY Left 02/01/2016   Procedure: LEFT TOTAL HIP ARTHROPLASTY ANTERIOR APPROACH;  Surgeon: Paralee Cancel, MD;  Location: [facility];  Service: Orthopedics;  Laterality: Left;  Failed Spinal to LMA  . TUBAL LIGATION      OB  History    No data available       Home Medications    Prior to Admission medications   Medication Sig Start Date End Date Taking? Authorizing Provider  apixaban (ELIQUIS) 5 MG TABS tablet Take 2 tablets (10 mg total) by mouth 2 (two) times daily. 03/11/16 03/18/16 Yes Nishant Dhungel, MD  HYDROcodone-acetaminophen (NORCO) 7.5-325 MG tablet Take 1 tablet by mouth every 4 (four) hours as needed for pain. 03/11/16  Yes Historical Provider, MD  levothyroxine (SYNTHROID, LEVOTHROID) 100 MCG tablet TAKE ONE TABLET BY MOUTH ONCE DAILY Patient taking differently: TAKE 100 MCG BY MOUTH ONCE DAILY 03/04/16  Yes Mikey Kirschner, MD  Multiple Vitamin (MULTIVITAMIN WITH MINERALS) TABS tablet Take 1 tablet by mouth daily.   Yes Historical Provider, MD  apixaban (ELIQUIS) 5 MG TABS tablet Take 1 tablet (5 mg total) by mouth 2 (two) times daily. 03/17/16 09/07/16  [name], MD    Family History Family History  Problem Relation Age of Onset  . Hypertension Mother   . Cancer Father     brain  . Cancer Other     colon  . Heart attack Other   . Cancer Other     throat    Social History Social History  Substance Use Topics  . Smoking status: Former Smoker    Types: Cigarettes    Quit date: 04/15/1996  . Smokeless tobacco:  Never Used     Comment: quit smoking20 years ago   . Alcohol use Yes     Comment: rare     Allergies   Penicillins; Flexeril [cyclobenzaprine]; Omnipaque [iohexol]; and Soma [carisoprodol]   Review of Systems Review of Systems  Constitutional:       Per HPI, otherwise negative  HENT:       Per HPI, otherwise negative  Respiratory:       Per HPI, otherwise negative  Cardiovascular:       Per HPI, otherwise negative  Gastrointestinal: Negative for vomiting.  Endocrine:       Negative aside from HPI  Genitourinary:       Neg aside from HPI   Musculoskeletal:       Per HPI, otherwise negative  Skin: Negative.   Neurological: Negative for syncope.      Physical Exam Updated Vital Signs BP 112/58   Pulse 89   Temp 98.7 F (37.1 C) (Oral)   Resp 18   Ht 5\' 4"  (1.626 m)   Wt 195 lb (88.5 kg)   SpO2 97%   BMI 33.47 kg/m   Physical Exam  Constitutional: She is oriented to person, place, and time. She appears well-developed and well-nourished. No distress.  HENT:  Head: Normocephalic and atraumatic.  Eyes: Conjunctivae and EOM are normal.  Cardiovascular: Normal rate and regular rhythm.   Pulmonary/Chest: Effort normal and breath sounds normal. No stridor. No respiratory distress.  Abdominal: She exhibits no distension.  Musculoskeletal: She exhibits no edema.  Neurological: She is alert and oriented to person, place, and time. No cranial nerve deficit.  Skin: Skin is warm and dry.  Skin is reportedly normal over the incision from the left hip replacement  Psychiatric: She has a normal mood and affect.  Nursing note and vitals reviewed.    ED Treatments / Results  Labs (all labs ordered are listed, but only abnormal results are displayed) Labs Reviewed  COMPREHENSIVE METABOLIC PANEL - Abnormal; Notable for the following:       Result Value   AST 14 (*)    All other components within normal limits  CBC WITH DIFFERENTIAL/PLATELET - Abnormal; Notable for the following:    Hemoglobin 11.4 (*)    MCH 24.7 (*)    Platelets 485 (*)    All other components within normal limits  TROPONIN I  BRAIN NATRIURETIC PEPTIDE    EKG with sinus tach 99 PA-C artifact, nonspecific T wave changes abnormal  Monitor with similar rhythm, sinus, 90s normal Pulse oximetry 97% room air normal   Radiology Dg Chest 2 View  Result Date: 03/16/2016 CLINICAL DATA:  History of PE with increased shortness of Breath EXAM: CHEST  2 VIEW COMPARISON:  03/09/2016 FINDINGS: Cardiac shadow is stable. The lungs are well aerated bilaterally with mild right basilar atelectasis slightly increased from the prior exam. A small right pleural effusion is  also noted posteriorly. This has increased from the recent CT examination. No bony abnormality is noted. IMPRESSION: Slight increase in right basilar effusion and atelectasis Electronically Signed   By: Inez Catalina M.D.   On: 03/16/2016 10:50   Ct Angio Chest Pe W And/or Wo Contrast  Result Date: 03/16/2016 CLINICAL DATA:  Recently discharge from hospital for treatment of BILATERAL pulmonary emboli, worsening pain and shortness of breath ever since, coughed up 3 dark red blood clots this morning ; history fibromyalgia, GERD, former smoker EXAM: CT ANGIOGRAPHY CHEST WITH CONTRAST TECHNIQUE: Multidetector CT imaging  of the chest was performed using the standard protocol during bolus administration of intravenous contrast. Multiplanar CT image reconstructions and MIPs were obtained to evaluate the vascular anatomy. Patient experienced allergic reaction to IV contrast material, broke out in hives and developed itching following contrast injection. This was treated with Benadryl. CONTRAST:  100 cc Isovue 370 IV COMPARISON:  03/09/2016 FINDINGS: Cardiovascular: Aorta normal caliber without aneurysm or dissection. No pericardial effusion. Minimal residual pulmonary embolus within RIGHT lower lobe pulmonary arteries, decreased since prior study. No new pulmonary emboli identified. Clot seen previously within LEFT lower lobe pulmonary arteries on the previous exam appears resolved. Mediastinum/Nodes: No thoracic adenopathy. Base of cervical region unremarkable. Lungs/Pleura: New small RIGHT pleural effusion with compressive atelectasis of adjacent RIGHT lower lobe. Remaining lungs clear. No pneumothorax or definite pulmonary infiltrate. Upper Abdomen: Visualized upper abdomen unremarkable. Musculoskeletal: Scattered Schmorl's nodes in thoracic spine. No acute osseous findings. Review of the MIP images confirms the above findings. IMPRESSION: Resolution of previously identified LEFT lower lobe pulmonary emboli with  marked decrease in thrombus in RIGHT lower lobe pulmonary arteries. No new pulmonary emboli identified. New small RIGHT pleural effusion and compressive atelectasis of RIGHT lower lobe. Electronically Signed   By: Lavonia Dana M.D.   On: 03/16/2016 13:13    Procedures Procedures (including critical care time)  Medications Ordered in ED Medications  iopamidol (ISOVUE-370) 76 % injection 100 mL (100 mLs Intravenous Contrast Given 03/16/16 1218)  diphenhydrAMINE (BENADRYL) injection 25 mg (25 mg Intravenous Given 03/16/16 1235)     Initial Impression / Assessment and Plan / ED Course  I have reviewed the triage vital signs and the nursing notes.  Pertinent labs & imaging results that were available during my care of the patient were reviewed by me and considered in my medical decision making (see chart for details).  Clinical Course    On repeat exam after return of CT scan, labs, I discussed the findings with patient and multiple family members. We reviewed today CT images, compared them with those from her original diagnosis. We discussed all findings again, discussed return precautions, follow up with pulmonology.   Final Clinical Impressions(s) / ED Diagnoses   Final diagnoses:  Hemoptysis   Patient with recent diagnosis of pulmonary embolism presents with new episode of hematemesis. Patient also describes chest pain, dyspnea, though these seem to be persistent since discharge. Here the patient is not hypoxic, and distress, but does have continued mild discomfort. No evidence for new coronary ischemia. CT scan demonstrates improvement of her pulmonary embolism. Patient encouraged to continue anticoagulant, follow up with pulmonology, given the large the reassuring findings today.    Carmin Muskrat, MD 03/16/16 [phone]

## 2016-03-16 NOTE — ED Notes (Signed)
She states all itching has resolved and I no longer see any urticaria per exam. She further states that whenever she gets up and ambulates she becomes pale, and short of breath and her skin becomes moist.

## 2016-03-16 NOTE — Discharge Instructions (Signed)
Please be sure to monitor your condition carefully. Return here for concerning changes, otherwise please be sure to follow-up with our pulmonology colleagues.

## 2016-03-17 NOTE — Telephone Encounter (Signed)
Left message on voicemail return call  

## 2016-03-17 NOTE — Telephone Encounter (Signed)
Spoke with patient and patient stated that she has appointment with Pulmonology on Monday 03/21/16 @ 10:45 am. Dr.Scott stated patient may follow up with him later next week after seeing pulmonology and cancel 9:30 appointment with him. Patient verbalized understanding. Advised the front staff to cancel mondays appointment.

## 2016-03-18 ENCOUNTER — Encounter (HOSPITAL_COMMUNITY): Payer: BLUE CROSS/BLUE SHIELD

## 2016-03-18 ENCOUNTER — Telehealth (HOSPITAL_COMMUNITY): Payer: Self-pay

## 2016-03-18 NOTE — Telephone Encounter (Signed)
cx Monday but should be coming after that

## 2016-03-21 ENCOUNTER — Ambulatory Visit (INDEPENDENT_AMBULATORY_CARE_PROVIDER_SITE_OTHER): Payer: BLUE CROSS/BLUE SHIELD | Admitting: Pulmonary Disease

## 2016-03-21 ENCOUNTER — Encounter: Payer: Self-pay | Admitting: Pulmonary Disease

## 2016-03-21 ENCOUNTER — Ambulatory Visit (HOSPITAL_COMMUNITY): Payer: BLUE CROSS/BLUE SHIELD

## 2016-03-21 ENCOUNTER — Ambulatory Visit: Payer: BLUE CROSS/BLUE SHIELD | Admitting: Family Medicine

## 2016-03-21 ENCOUNTER — Ambulatory Visit (INDEPENDENT_AMBULATORY_CARE_PROVIDER_SITE_OTHER)
Admission: RE | Admit: 2016-03-21 | Discharge: 2016-03-21 | Disposition: A | Discharge status: Home / Self Care | Payer: BLUE CROSS/BLUE SHIELD | Source: Ambulatory Visit | Attending: Pulmonary Disease | Admitting: Pulmonary Disease

## 2016-03-21 ENCOUNTER — Telehealth (HOSPITAL_COMMUNITY): Payer: Self-pay | Admitting: Physical Therapy

## 2016-03-21 VITALS — BP 114/86 | HR 93 | Ht 64.0 in | Wt 197.0 lb

## 2016-03-21 DIAGNOSIS — R0600 Dyspnea, unspecified: Secondary | ICD-10-CM

## 2016-03-21 MED ORDER — PREDNISONE 10 MG PO TABS: ORAL_TABLET | ORAL | 0 refills | Status: DC

## 2016-03-21 NOTE — Progress Notes (Signed)
Past surgical history She  has a past surgical history that includes Eye surgery; Carpal tunnel release; Mandible surgery; Shoulder surgery; Root canal; Tubal ligation; Tonsillectomy; Total hip arthroplasty (Left, 02/01/2016); and Spinal fusion (2003).  Family history Her family history includes Cancer in her father, other, and other; Heart attack in her other; Hypertension in her mother.  Social history She  reports that she quit smoking about 19 years ago. Her smoking use included Cigarettes. She has a 5.50 pack-year smoking history. She has never used smokeless tobacco. She reports that she drinks alcohol. She reports that she does not use drugs.  Allergies  Allergen Reactions  . Penicillins Hives, Shortness Of Breath and Other (See Comments)    Childhood reaction was told her reaction may have been shortness of breath and hives Has patient had a PCN reaction causing immediate rash, facial/tongue/throat swelling, SOB or lightheadedness with hypotension: yes Has patient had a PCN reaction causing severe rash involving mucus membranes or skin necrosis: no Has patient had a PCN reaction that required hospitalization no Has patient had a PCN reaction occurring within the last 10 years: no If all of the above answers are "NO", then may proceed   . Flexeril [Cyclobenzaprine] Other (See Comments)    Heart problems - was told she should probably avoid all muscle relaxers   . Omnipaque [Iohexol] Hives and Itching    RN to give pt benadryl  . Soma [Carisoprodol] Other (See Comments)    Heart problems - was told she should probably avoid all muscle relaxers    Review of Systems  Constitutional: Negative for fever and unexpected weight change.  HENT: Negative for congestion, dental problem, ear pain, nosebleeds, postnasal drip, rhinorrhea, sinus pressure, sneezing, sore throat and trouble swallowing.   Eyes: Negative for redness and itching.  Respiratory: Positive for cough and shortness of  breath. Negative for chest tightness and wheezing.   Cardiovascular: Positive for chest pain. Negative for palpitations and leg swelling.  Gastrointestinal: Negative for nausea and vomiting.  Genitourinary: Negative for dysuria.  Musculoskeletal: Negative for joint swelling.  Skin: Negative for rash.  Neurological: Positive for headaches.  Hematological: Does not bruise/bleed easily.  Psychiatric/Behavioral: Negative for dysphoric mood. The patient is not nervous/anxious.     Current Outpatient Prescriptions on File Prior to Visit  Medication Sig  . apixaban (ELIQUIS) 5 MG TABS tablet Take 1 tablet (5 mg total) by mouth 2 (two) times daily.  Marland Kitchen HYDROcodone-acetaminophen (NORCO) 7.5-325 MG tablet Take 1 tablet by mouth every 4 (four) hours as needed for pain.  Marland Kitchen levothyroxine (SYNTHROID, LEVOTHROID) 100 MCG tablet TAKE ONE TABLET BY MOUTH ONCE DAILY (Patient taking differently: TAKE 100 MCG BY MOUTH ONCE DAILY)  . Multiple Vitamin (MULTIVITAMIN WITH MINERALS) TABS tablet Take 1 tablet by mouth daily.   No current facility-administered medications on file prior to visit.     Chief Complaint  Patient presents with  . Pulmonary Consult    Referred by Dr Vanita Panda for HFU - hemoptysis. Recent CXR. Notes low O2 sats with rest/exertion around low 80's. Reports low grade fever 99-100 since d/c home.    Pulmonary tests CT angio chest 03/09/16 >> b/l PE, small Rt effusion CT angio chest 03/16/16 >> decreased clot burden, Rt effusion  Cardiac tests Echo 03/10/16 >> EF 55 to [id number], grade 1 diastolic CHF, PAS 33 mmHg  Past medical history She  has a past medical history of Arthritis; Bilateral conjunctivitis; Complication of anesthesia; Fibromyalgia; GERD (gastroesophageal reflux disease); Hypothyroidism; and  PONV (postoperative nausea and vomiting).  Vital signs BP 114/86 (BP Location: Left Arm, Cuff Size: Normal)   Pulse 93   Ht 5\' 4"  (1.626 m)   Wt 197 lb (89.4 kg)   SpO2 99%   BMI 33.81  kg/m   History of present illness Wendy Marshall is a 60 y.o. female with dyspnea, hemoptysis, and pleuritic chest pain.  She had hip surgery on February 01, 2016.  She developed severe left leg swelling 2 weeks later.  Doppler of leg was reported as negative.  On March 09, 2016 she developed severe chest pain and dyspnea.  She went to ER and was found to have pulmonary embolism.  She was started on anticoagulation.  She had cough with blood last week >> none since.  She denies epistaxis, or bruising.  No prior hx of PE, and didn't have any breathing problems before this past few weeks.  She continues to have pain in her chest with deep breathing Lt > Rt side.  Her leg swelling is better.  She denies skin rash, joint swelling.  She gets low grade temperature of 55F.  Her father had a clot after being diagnosed with cancer.  Physical exam  General - No distress ENT - No sinus tenderness, no oral exudate, no LAN, no thyromegaly, TM clear, pupils equal/reactive Cardiac - s1s2 regular, no murmur, pulses symmetric Chest - Decreased respiratory excursion due to pain, no wheeze, faint crackles Rt > Lt base Back - No focal tenderness Abd - Soft, non-tender, no organomegaly, + bowel sounds Ext - No edema Neuro - Normal strength, cranial nerves intact Skin - No rashes Psych - Normal mood, and behavior   CMP Latest Ref Rng & Units 03/16/2016 03/14/2016 03/10/2016  Glucose 65 - 99 mg/dL 92 84 119(H)  BUN 6 - 20 mg/dL 10 10 8   Creatinine 0.44 - 1.00 mg/dL 0.83 1.01(H) 0.70  Sodium 135 - 145 mmol/L 138 141 138  Potassium 3.5 - 5.1 mmol/L 4.3 4.5 4.0  Chloride 101 - 111 mmol/L 106 99 106  CO2 22 - 32 mmol/L 26 23 26   Calcium 8.9 - 10.3 mg/dL 9.1 9.3 8.6(L)  Total Protein 6.5 - 8.1 g/dL 7.7 - -  Total Bilirubin 0.3 - 1.2 mg/dL 0.4 - -  Alkaline Phos 38 - 126 U/L 80 - -  AST 15 - 41 U/L 14(L) - -  ALT 14 - 54 U/L 14 - -     CBC Latest Ref Rng & Units 03/16/2016 03/14/2016 03/11/2016  WBC 4.0 - 10.5  K/uL 5.2 6.6 8.3  Hemoglobin 12.0 - 15.0 g/dL 11.4(L) 12.1(A) 9.6(L)  Hematocrit 36.0 - 46.0 % 37.3 38.2 31.0(L)  Platelets 150 - 400 K/uL 485(H) 496(H) 273    Discussion She has recent episode of acute pulmonary embolism after recent hip surgery, and this was complicated by episode of hemoptysis which has since resolved.  She has small Rt pleural effusion, and has symptoms of pleuritis.  Her most recent CT chest shows changes suggestive of pulmonary infarcts, and these correspond to areas of her pleuritic chest pain.  Assessment/plan  Pleuritic chest pain with recent small Rt pleural effusion in setting of acute pulmonary embolism. - continue eliquis per PCP - will give course of prednisone for pleuritic chest pain - repeat chest xray today   Patient Instructions  Chest xray today  Prednisone 10 mg pill >> 3 pills daily for 2 days, 2 pills daily for 2 days, 1 pill daily for 2  days  Follow up in 3 weeks with Dr. Halford Chessman or Nurse Practitioner     Chesley Mires, MD [facility] Pager:  [phone] 03/21/2016, 11:00 AM

## 2016-03-21 NOTE — Progress Notes (Signed)
   Subjective:    Patient ID: Wendy Marshall, female    DOB: Oct 30, 1955, 60 y.o.   MRN: [id number]  HPI    Review of Systems  Constitutional: Negative for fever and unexpected weight change.  HENT: Negative for congestion, dental problem, ear pain, nosebleeds, postnasal drip, rhinorrhea, sinus pressure, sneezing, sore throat and trouble swallowing.   Eyes: Negative for redness and itching.  Respiratory: Positive for cough and shortness of breath. Negative for chest tightness and wheezing.   Cardiovascular: Positive for chest pain. Negative for palpitations and leg swelling.  Gastrointestinal: Negative for nausea and vomiting.  Genitourinary: Negative for dysuria.  Musculoskeletal: Negative for joint swelling.  Skin: Negative for rash.  Neurological: Positive for headaches.  Hematological: Does not bruise/bleed easily.  Psychiatric/Behavioral: Negative for dysphoric mood. The patient is not nervous/anxious.        Objective:   Physical Exam        Assessment & Plan:

## 2016-03-21 NOTE — Patient Instructions (Signed)
Chest xray today  Prednisone 10 mg pill >> 3 pills daily for 2 days, 2 pills daily for 2 days, 1 pill daily for 2 days  Follow up in 3 weeks with Dr. Halford Chessman or Nurse Practitioner

## 2016-03-21 NOTE — Telephone Encounter (Signed)
She has a embolism and MD advised NO PT, will reschedule after 04/11/2016 when she sees the MD again. Request to be put on hold

## 2016-03-22 ENCOUNTER — Encounter (HOSPITAL_COMMUNITY): Payer: Self-pay

## 2016-03-22 ENCOUNTER — Telehealth: Payer: Self-pay | Admitting: Pulmonary Disease

## 2016-03-22 NOTE — Telephone Encounter (Signed)
Dg Chest 2 View  Result Date: 03/21/2016 CLINICAL DATA:  Short of breath with left-sided chest pain for 2 weeks. EXAM: CHEST  2 VIEW COMPARISON:  03/16/2016 CT of 03/16/2016. FINDINGS: Prior cervical spine fixation. Midline trachea. Mild cardiomegaly. Mediastinal contours otherwise within normal limits. Mild right hemidiaphragm elevation. Nearly completely resolved tiny right pleural effusion. No pneumothorax. No congestive failure. Clear lungs. IMPRESSION: Improved right base aeration with trace right pleural fluid remaining. Mild cardiomegaly, without congestive failure. Electronically Signed   By: Abigail Miyamoto M.D.   On: 03/21/2016 12:02     Spoke with pt.  Explained CXR looks better.  She still has pleuritic chest pain, but improved from yesterday >> advised her to continue prednisone.

## 2016-03-23 ENCOUNTER — Encounter (HOSPITAL_COMMUNITY): Payer: BLUE CROSS/BLUE SHIELD | Admitting: Physical Therapy

## 2016-03-25 ENCOUNTER — Encounter (HOSPITAL_COMMUNITY): Payer: BLUE CROSS/BLUE SHIELD

## 2016-03-28 ENCOUNTER — Encounter (HOSPITAL_COMMUNITY): Payer: BLUE CROSS/BLUE SHIELD

## 2016-03-30 ENCOUNTER — Encounter (HOSPITAL_COMMUNITY): Payer: BLUE CROSS/BLUE SHIELD | Admitting: Physical Therapy

## 2016-04-01 ENCOUNTER — Encounter (HOSPITAL_COMMUNITY): Payer: BLUE CROSS/BLUE SHIELD

## 2016-04-06 ENCOUNTER — Encounter (HOSPITAL_COMMUNITY): Payer: BLUE CROSS/BLUE SHIELD | Attending: Hematology | Admitting: Hematology

## 2016-04-06 ENCOUNTER — Encounter (HOSPITAL_COMMUNITY): Payer: Self-pay | Admitting: Hematology

## 2016-04-06 VITALS — BP 124/70 | HR 87 | Temp 99.4°F | Resp 18 | Ht 64.0 in | Wt 201.1 lb

## 2016-04-06 DIAGNOSIS — Z96642 Presence of left artificial hip joint: Secondary | ICD-10-CM

## 2016-04-06 DIAGNOSIS — I2699 Other pulmonary embolism without acute cor pulmonale: Secondary | ICD-10-CM

## 2016-04-06 DIAGNOSIS — D6869 Other thrombophilia: Secondary | ICD-10-CM

## 2016-04-06 DIAGNOSIS — Z7901 Long term (current) use of anticoagulants: Secondary | ICD-10-CM

## 2016-04-06 NOTE — Patient Instructions (Addendum)
[facility]  Discharge Instructions:  Seen by MD Kale today.  _______________________________________________________________  Thank you for choosing [facility] at [facility] to provide your oncology and hematology care.  To afford each patient quality time with our providers, please arrive at least 15 minutes before your scheduled appointment.  You need to re-schedule your appointment if you arrive 10 or more minutes late.  We strive to give you quality time with our providers, and arriving late affects you and other patients whose appointments are after yours.  Also, if you no show three or more times for appointments you may be dismissed from the clinic.  Again, thank you for choosing [facility] at [facility]. Our hope is that these requests will allow you access to exceptional care and in a timely manner. _______________________________________________________________  If you have questions after your visit, please contact our office at [phone] between the hours of 8:30 a.m. and 5:00 p.m. Voicemails left after 4:30 p.m. will not be returned until the following business day. _______________________________________________________________  For prescription refill requests, have your pharmacy contact our office. _______________________________________________________________  Recommendations made by the consultant and any test results will be sent to your referring physician. _______________________________________________________________ 

## 2016-04-06 NOTE — Progress Notes (Signed)
Marland Kitchen    HEMATOLOGY/ONCOLOGY CONSULTATION NOTE  Date of Service: 04/06/2016  Patient Care Team: [name], MD as PCP - General (Family Medicine)  CHIEF COMPLAINTS/PURPOSE OF CONSULTATION:  Bilateral pulmonary embolism after left hip arthroplasty  HISTORY OF PRESENTING ILLNESS:   Wendy Marshall is a wonderful 60 y.o. female who has been referred to [place] by Dr .Sallee Lange, MD  for evaluation and recommendations regarding anticoagulation for bilateral pulmonary embolisms after left hip arthroplasty.  Patient has a history of fibromyalgia, GERD, hypothyroidism and osteoarthritis but otherwise has been in good health overall. Patient had an elective left total hip arthroplasty on 02/01/2016 by Dr. Alvan Dame which was uneventful. She was placed on aspirin alone for DVT prophylaxis.  Patient presented to the emergency room on 02/14/2016 with significant redness and swelling of her left lower extremity. Ultrasound could not be done in the evening and so she received a dose of Lovenox in the emergency room and had an ultrasound of her left lower extremity on 7/70/2017 that showed no obvious superficial vein or deep vein thrombosis. Patient notes that her left lower extremity swelling improved somewhat. She subsequently presented again to the emergency room on 03/09/2016 with significant right-sided chest pain and shortness of breath. Patient had a CTA of the chest that showed bilateral pulmonary emboli involving the upper and lower lobe branches bilaterally. Bilateral atelectasis and small right-sided pleural effusion. No overt pulmonary infarction. Patient had bilateral lower extremity ultrasound on 03/10/2016 which showed no evidence of DVT or superficial thrombophlebitis. Echocardiogram on 03/10/2016 showed normal ejection fraction of 55-60%. No regional wall motion abnormalities. Mild concentric LVH. Normal pulmonary artery systolic pressure of 33 mmHg. Minimal hypokinesis of the right ventricular  apex.  Patient was treated with IV heparin and discharged on loading dose of Eliquis. She subsequently presented on 03/16/2016 with some minimal hemoptysis which was self-limited. He had a repeat CTA of the chest which showed resolution of previously identified left lower lobe pulmonary emboli with marked decrease in thrombus in the right lower lobe pulmonary arteries. No new pulmonary emboli identified.  Patient has continued to be on Eliquis 5 mg by mouth twice a day and has tolerated this well without any issues with further hemoptysis or other bleeding. Patient notes that her breathing is gradually improving and her chest pain is improving as well. Still has some mild pleuritic right-sided chest pain and notes some dyspnea on exertion. Anxious about whether she would feel better by the time she has to return to work in September. Notes some left hip discomfort. Incision is clean and closed with no discharge. No overt redness or fluctuance over the left hip surgical incision.   No leg swelling or redness at this time. No calf tenderness.  Patient has never been a smoker. She has some obesity with a BMI of nearly 35. No personal history of previous DVT or PE prior to her recent event. She has gone through 2 pregnancies and short-term use of oral contraceptives in the past without any issues with VTE. She has also had multiple previous surgeries as noted below without any issues with VTE. No focal symptoms suggestive of any new tumors/neoplasm.  Patient notes that her father had blood clots but that was in the setting of cancer. She notes that her maternal uncle had 2 pulmonary embolisms at age 63 years.. Unclear circumstances or trigger events. Limited information available.  Patient notes some low-grade fevers in the 99-100F range.     MEDICAL HISTORY:  Past Medical History:  Diagnosis Date  . Arthritis   . Bilateral conjunctivitis    seen by PCP on 01/06/16, patient states Dr Alvan Dame aware   .  Complication of anesthesia    slow to wake up with tonsils and tubal ligation   . Fibromyalgia   . GERD (gastroesophageal reflux disease)    occasional   . Hypothyroidism   . PONV (postoperative nausea and vomiting)     SURGICAL HISTORY: Past Surgical History:  Procedure Laterality Date  . CARPAL TUNNEL RELEASE    . EYE SURGERY    . MANDIBLE SURGERY    . ROOT CANAL    . SHOULDER SURGERY    . SPINAL FUSION  2003  . TONSILLECTOMY    . TOTAL HIP ARTHROPLASTY Left 02/01/2016   Procedure: LEFT TOTAL HIP ARTHROPLASTY ANTERIOR APPROACH;  Surgeon: Paralee Cancel, MD;  Location: [facility];  Service: Orthopedics;  Laterality: Left;  Failed Spinal to LMA  . TUBAL LIGATION      SOCIAL HISTORY: Social History   Social History  . Marital status: Married    Spouse name: N/A  . Number of children: N/A  . Years of education: N/A   Occupational History  . Production    Social History Main Topics  . Smoking status: Former Smoker    Packs/day: 0.50    Years: 11.00    Types: Cigarettes    Quit date: 04/15/1996  . Smokeless tobacco: Never Used     Comment: quit smoking20 years ago   . Alcohol use Yes     Comment: rare  . Drug use: No  . Sexual activity: Not on file   Other Topics Concern  . Not on file   Social History Narrative  . No narrative on file    FAMILY HISTORY: Family History  Problem Relation Age of Onset  . Hypertension Mother   . Cancer Father     brain  . Cancer Other     colon  . Heart attack Other   . Cancer Other     throat    ALLERGIES:  is allergic to penicillins; contrast media [iodinated diagnostic agents]; flexeril [cyclobenzaprine]; omnipaque [iohexol]; and soma [carisoprodol].  MEDICATIONS:  Current Outpatient Prescriptions  Medication Sig Dispense Refill  . apixaban (ELIQUIS) 5 MG TABS tablet Take 1 tablet (5 mg total) by mouth 2 (two) times daily. 60 tablet 0  . HYDROcodone-acetaminophen (NORCO) 7.5-325 MG tablet Take 1 tablet by mouth every 4  (four) hours as needed for pain.    Marland Kitchen levothyroxine (SYNTHROID, LEVOTHROID) 100 MCG tablet TAKE ONE TABLET BY MOUTH ONCE DAILY (Patient taking differently: TAKE 100 MCG BY MOUTH ONCE DAILY) 90 tablet 0  . Multiple Vitamin (MULTIVITAMIN WITH MINERALS) TABS tablet Take 1 tablet by mouth daily.     No current facility-administered medications for this visit.     REVIEW OF SYSTEMS:    10 Point review of Systems was done is negative except as noted above.  PHYSICAL EXAMINATION: ECOG PERFORMANCE STATUS: 1 - Symptomatic but completely ambulatory  . Vitals:   04/06/16 1600  BP: 124/70  Pulse: 87  Resp: 18  Temp: 99.4 F (37.4 C)   Filed Weights   04/06/16 1600  Weight: 201 lb 1 oz (91.2 kg)   .Body mass index is 34.51 kg/m.  GENERAL:alert, in no acute distress and comfortable SKIN: skin color, texture, turgor are normal, no rashes or significant lesions EYES: normal, conjunctiva are pink and non-injected, sclera clear OROPHARYNX:no exudate,  no erythema and lips, buccal mucosa, and tongue normal  NECK: supple, no JVD, thyroid normal size, non-tender, without nodularity LYMPH:  no palpable lymphadenopathy in the cervical, axillary or inguinal LUNGS: clear to auscultation with normal respiratory effort HEART: regular rate & rhythm,  no murmurs and no lower extremity edema ABDOMEN: abdomen soft, non-tender, normoactive bowel sounds  Musculoskeletal: no cyanosis of digits and no clubbing  PSYCH: alert & oriented x 3 with fluent speech NEURO: no focal motor/sensory deficits  LABORATORY DATA:  I have reviewed the data as listed  . CBC Latest Ref Rng & Units 03/16/2016 03/14/2016 03/11/2016  WBC 4.0 - 10.5 K/uL 5.2 6.6 8.3  Hemoglobin 12.0 - 15.0 g/dL 11.4(L) 12.1(A) 9.6(L)  Hematocrit 36.0 - 46.0 % 37.3 38.2 31.0(L)  Platelets 150 - 400 K/uL 485(H) 496(H) 273    . CMP Latest Ref Rng & Units 03/16/2016 03/14/2016 03/10/2016  Glucose 65 - 99 mg/dL 92 84 119(H)  BUN 6 - 20 mg/dL 10  10 8   Creatinine 0.44 - 1.00 mg/dL 0.83 1.01(H) 0.70  Sodium 135 - 145 mmol/L 138 141 138  Potassium 3.5 - 5.1 mmol/L 4.3 4.5 4.0  Chloride 101 - 111 mmol/L 106 99 106  CO2 22 - 32 mmol/L 26 23 26   Calcium 8.9 - 10.3 mg/dL 9.1 9.3 8.6(L)  Total Protein 6.5 - 8.1 g/dL 7.7 - -  Total Bilirubin 0.3 - 1.2 mg/dL 0.4 - -  Alkaline Phos 38 - 126 U/L 80 - -  AST 15 - 41 U/L 14(L) - -  ALT 14 - 54 U/L 14 - -    RADIOGRAPHIC STUDIES: I have personally reviewed the radiological images as listed and agreed with the findings in the report. Dg Chest 2 View  Result Date: 03/21/2016 CLINICAL DATA:  Short of breath with left-sided chest pain for 2 weeks. EXAM: CHEST  2 VIEW COMPARISON:  03/16/2016 CT of 03/16/2016. FINDINGS: Prior cervical spine fixation. Midline trachea. Mild cardiomegaly. Mediastinal contours otherwise within normal limits. Mild right hemidiaphragm elevation. Nearly completely resolved tiny right pleural effusion. No pneumothorax. No congestive failure. Clear lungs. IMPRESSION: Improved right base aeration with trace right pleural fluid remaining. Mild cardiomegaly, without congestive failure. Electronically Signed   By: Abigail Miyamoto M.D.   On: 03/21/2016 12:02   Dg Chest 2 View  Result Date: 03/16/2016 CLINICAL DATA:  History of PE with increased shortness of Breath EXAM: CHEST  2 VIEW COMPARISON:  03/09/2016 FINDINGS: Cardiac shadow is stable. The lungs are well aerated bilaterally with mild right basilar atelectasis slightly increased from the prior exam. A small right pleural effusion is also noted posteriorly. This has increased from the recent CT examination. No bony abnormality is noted. IMPRESSION: Slight increase in right basilar effusion and atelectasis Electronically Signed   By: Inez Catalina M.D.   On: 03/16/2016 10:50   Dg Chest 2 View  Result Date: 03/09/2016 CLINICAL DATA:  Onset of right chest pain and SOB today - pt states she can not take in a deep breath - states two  weeks ago her left leg suddenly became swollen, she was seen in ER and treated for DVT - hx left total hip replacement @ 5 weeks agp EXAM: CHEST - 2 VIEW COMPARISON:  none FINDINGS: Low lung volumes with some crowding of perihilar and bibasilar bronchovascular structures. No focal infiltrate or overt edema. The heart size upper limits normal, probably emphasized by low lung volumes. No effusion or pneumothorax. Cervical fixation hardware noted. IMPRESSION: Low  volumes.  No acute disease. Electronically Signed   By: Lucrezia Europe M.D.   On: 03/09/2016 18:59   Ct Angio Chest Pe W And/or Wo Contrast  Result Date: 03/16/2016 CLINICAL DATA:  Recently discharge from hospital for treatment of BILATERAL pulmonary emboli, worsening pain and shortness of breath ever since, coughed up 3 dark red blood clots this morning ; history fibromyalgia, GERD, former smoker EXAM: CT ANGIOGRAPHY CHEST WITH CONTRAST TECHNIQUE: Multidetector CT imaging of the chest was performed using the standard protocol during bolus administration of intravenous contrast. Multiplanar CT image reconstructions and MIPs were obtained to evaluate the vascular anatomy. Patient experienced allergic reaction to IV contrast material, broke out in hives and developed itching following contrast injection. This was treated with Benadryl. CONTRAST:  100 cc Isovue 370 IV COMPARISON:  03/09/2016 FINDINGS: Cardiovascular: Aorta normal caliber without aneurysm or dissection. No pericardial effusion. Minimal residual pulmonary embolus within RIGHT lower lobe pulmonary arteries, decreased since prior study. No new pulmonary emboli identified. Clot seen previously within LEFT lower lobe pulmonary arteries on the previous exam appears resolved. Mediastinum/Nodes: No thoracic adenopathy. Base of cervical region unremarkable. Lungs/Pleura: New small RIGHT pleural effusion with compressive atelectasis of adjacent RIGHT lower lobe. Remaining lungs clear. No pneumothorax or  definite pulmonary infiltrate. Upper Abdomen: Visualized upper abdomen unremarkable. Musculoskeletal: Scattered Schmorl's nodes in thoracic spine. No acute osseous findings. Review of the MIP images confirms the above findings. IMPRESSION: Resolution of previously identified LEFT lower lobe pulmonary emboli with marked decrease in thrombus in RIGHT lower lobe pulmonary arteries. No new pulmonary emboli identified. New small RIGHT pleural effusion and compressive atelectasis of RIGHT lower lobe. Electronically Signed   By: Lavonia Dana M.D.   On: 03/16/2016 13:13   Ct Angio Chest Pe W And/or Wo Contrast  Result Date: 03/09/2016 CLINICAL DATA:  Right-sided chest pain. Shortness of breath. Symptoms started this morning. EXAM: CT ANGIOGRAPHY CHEST WITH CONTRAST TECHNIQUE: Multidetector CT imaging of the chest was performed using the standard protocol during bolus administration of intravenous contrast. Multiplanar CT image reconstructions and MIPs were obtained to evaluate the vascular anatomy. CONTRAST:  100 cc Isovue 370 COMPARISON:  03/09/2016 FINDINGS: Heart: Heart size is normal. No imaged pericardial effusion or significant coronary artery calcifications. The RV/LV ratio is normal. Vascular structures: There is significant pulmonary embolus, involving upper and lower lobe branches bilaterally. Aortic arch is normal in appearance. Mediastinum/thyroid: The visualized portion of the thyroid gland has a normal appearance. No mediastinal, hilar, or axillary adenopathy. Small hiatal hernia is noted. Otherwise the esophagus is normal in appearance. Lungs/Airways: There is right lower lobe atelectasis and a small right pleural effusion. Small amount atelectasis is also identified at the left lung base. Upper abdomen: Unremarkable. Chest wall/osseous structures: Moderate degenerative changes in the thoracic spine. Prior cervical fusion. Review of the MIP images confirms the above findings. IMPRESSION: 1. Bilateral  pulmonary emboli, involving upper and lower lobe branches bilaterally. No evidence for right heart strain. 2. Bilateral atelectasis.  Small right pleural effusion. 3. Small hiatal hernia. 4. Critical Value/emergent results were called by telephone at the time of interpretation on 03/09/2016 at 8:23 pm to Dr. Shary Decamp , who verbally acknowledged these results. Electronically Signed   By: Nolon Nations M.D.   On: 03/09/2016 20:24    [place] ext b/l LE 03/10/2016 ------------------------------------------------------------------- Summary:  - No evidence of deep vein or superficial thrombosis involving the   right lower extremity and left lower extremity. - No evidence of Baker&'s cyst  on the right or left.  Other specific details can be found in the table(s) above. Prepared and Electronically Authenticated by  Adele Barthel, MD 2017-08-10T20:32:27  ASSESSMENT & PLAN:    60 year old Caucasian female with  #1 Bilateral extensive pulmonary embolism Her recent left hip surgery was a clear trigger event for her pulmonary embolisms. She has had no previous history of VTE despite 2 pregnancies, the use of oral contraceptives and multiple surgeries. This and an indeterminate family history does not suggest any overt hereditary and unusual acquired hypercoagulable state.  Acquired risk factors- left hip arthroplasty appears to be a clear trigger event. Aspirin alone seemed to be a suboptimal DVT prophylaxis regimen in her situation. Obesity with a BMI of 35 visit initial risk factor.  Patient is tolerating anticoagulation well with gradually improving pulmonary symptoms. Her repeat CTA of the chest in about a week after her first one had already showed that her PEs were resolving.  PLAN -Patient appears to have clearly triggered bilateral extensive pulmonary embolism with no clear evidence of DVT . Statistically it is likely that her PE originated from the veins of her lower extremities. -No clear  indication for a hypercoagulable workup at this time. -Would recommend interrupted anticoagulation with Eliquis for at least 6 months. Given the extent of her pulmonary embolism it would be reasonable to offer her extended anticoagulation up to 9-12 months if no concerns with bleeding.  -Alternatively patient might switch to prophylactic aspirin after 6 months of the Eliquis. -Early ambulation to avoid deconditioning and maintaining mobility and range of motion with her left THA. -Follow-up with Dr. Alvan Dame to evaluate left hip discomfort -likely just postsurgical pain however with low-grade fevers would need assessment to rule out any issues with infection. -Patient will need antibiotic prophylaxis before any planned procedures to reduce the risk of infection of her left hip prosthesis. -Monitor CBC and CMP every 3 months with primary care physician to watch for any silent blood loss and monitor renal function. -Patient was counseled extensively on the need to maintain perfect medication compliance. - Patient will need close attention to any planned surgical procedures in the future with regards to DVT px  Continue follow-up with primary care physician. Reconsult [place] if any other acute new concerns or questions arise. All of the patients and her husband questions were answered to their apparent satisfaction. The patient knows to call the clinic with any problems, questions or concerns.  I spent 60 minutes counseling the patient face to face. The total time spent in the appointment was 60 minutes and more than 50% was on counseling and direct patient cares.    Sullivan Lone MD [place] [facility] Hematology/Oncology Physician [facility]  (Office):       [phone] (Work cell):  [phone] (Fax):           [phone]  04/06/2016 4:18 PM

## 2016-04-07 ENCOUNTER — Encounter (HOSPITAL_COMMUNITY): Payer: Self-pay | Admitting: Hematology

## 2016-04-10 ENCOUNTER — Telehealth: Payer: Self-pay | Admitting: Family Medicine

## 2016-04-10 DIAGNOSIS — Z79899 Other long term (current) drug therapy: Secondary | ICD-10-CM

## 2016-04-10 NOTE — Telephone Encounter (Signed)
Please let the patient know that we were able to receive updates from both pulmonology and hematology regarding her blood clots. We agree with and recommend for her to continue Eliquis. I also recommend that this patient follow-up with [place] in approximately 4 months. In addition to this flu vaccine is recommended for this patient-she can get this at our office or any pharmacy.

## 2016-04-11 ENCOUNTER — Ambulatory Visit (HOSPITAL_COMMUNITY)
Admission: RE | Admit: 2016-04-11 | Discharge: 2016-04-11 | Disposition: A | Discharge status: Home / Self Care | Payer: BLUE CROSS/BLUE SHIELD | Source: Ambulatory Visit | Attending: Acute Care | Admitting: Acute Care

## 2016-04-11 ENCOUNTER — Telehealth: Payer: Self-pay | Admitting: Pulmonary Disease

## 2016-04-11 ENCOUNTER — Encounter: Payer: Self-pay | Admitting: Acute Care

## 2016-04-11 ENCOUNTER — Ambulatory Visit (INDEPENDENT_AMBULATORY_CARE_PROVIDER_SITE_OTHER): Payer: BLUE CROSS/BLUE SHIELD | Admitting: Acute Care

## 2016-04-11 VITALS — BP 124/86 | HR 84 | Ht 64.0 in | Wt 203.0 lb

## 2016-04-11 DIAGNOSIS — I2699 Other pulmonary embolism without acute cor pulmonale: Secondary | ICD-10-CM

## 2016-04-11 DIAGNOSIS — R079 Chest pain, unspecified: Secondary | ICD-10-CM | POA: Insufficient documentation

## 2016-04-11 DIAGNOSIS — R0600 Dyspnea, unspecified: Secondary | ICD-10-CM

## 2016-04-11 DIAGNOSIS — G4730 Sleep apnea, unspecified: Secondary | ICD-10-CM

## 2016-04-11 DIAGNOSIS — G4710 Hypersomnia, unspecified: Secondary | ICD-10-CM | POA: Diagnosis not present

## 2016-04-11 MED ORDER — APIXABAN 5 MG PO TABS: 5.0000 mg | ORAL_TABLET | Freq: Two times a day (BID) | ORAL | 5 refills | Status: DC

## 2016-04-11 MED ORDER — TECHNETIUM TO 99M ALBUMIN AGGREGATED
4.2000 | Freq: Once | INTRAVENOUS | Status: AC | PRN
  Administered 2016-04-11: 4.2 via INTRAVENOUS

## 2016-04-11 MED ORDER — TECHNETIUM TC 99M DIETHYLENETRIAME-PENTAACETIC ACID
32.0000 | Freq: Once | INTRAVENOUS | Status: AC | PRN
  Administered 2016-04-11: 32 via INTRAVENOUS

## 2016-04-11 NOTE — Assessment & Plan Note (Signed)
Slow to resolve pleuritic pain / dyspnea post provoked  PE Plan: Continue taking your Eliquis daily as prescribed. We will schedule you for a VQ scan today. Return in 1 week for follow up with Dr. Remi Haggard  To  determine if you can be cleared for work/ Physical Therapy. Call if you decide you want some Tylenol with codeine for pain. We will schedule you for a home sleep study to evaluate you for sleep apnea.. Please contact office for sooner follow up if symptoms do not improve or worsen or seek emergency care

## 2016-04-11 NOTE — Patient Instructions (Addendum)
It is good to meet you today. Continue taking your Eliquis daily as prescribed. We will schedule you for a VQ scan today. Return in 1 week for follow up with Dr. Remi Haggard  To  determine if you can be cleared for work/ Physical Therapy. Call if you decide you want some Tylenol with codeine for pain. We will schedule you for a home sleep study to evaluate you for sleep apnea.. Please contact office for sooner follow up if symptoms do not improve or worsen or seek emergency care

## 2016-04-11 NOTE — Telephone Encounter (Signed)
Patient notified refills sent to pharmacy and bloodwork was ordered

## 2016-04-11 NOTE — Telephone Encounter (Signed)
She may have refill on Eliquis plus for additional refills. Also order a metabolic 7 liver profile to be done in 3-4 weeks. Also follow-up office visit as discussed.

## 2016-04-11 NOTE — Telephone Encounter (Signed)
[facility] does have any appointments in 1 week.  SG - please advise. Can pt follow up with you? Thanks.

## 2016-04-11 NOTE — Addendum Note (Signed)
Addended by: Jesusita Oka on: 04/11/2016 09:51 AM   Modules accepted: Orders

## 2016-04-11 NOTE — Telephone Encounter (Signed)
Does Wendy Marshall have any appointments? She needs to see a doc to determine if she can return to work 9/25 after her PE  With continued dyspnea.

## 2016-04-11 NOTE — Assessment & Plan Note (Signed)
Per Husband awakens with duskiness at times and snores loudly. Obese Plan: Sleep Study to evaluate for OSA

## 2016-04-11 NOTE — Telephone Encounter (Signed)
Notified patient that we were able to receive updates from both pulmonology and hematology regarding her blood clots. We agree with and recommend for her to continue Eliquis. Recommend that this patient follow-up with [place] in approximately 4 months. In addition to this flu vaccine is recommended for this patient-she can get this at our office or any pharmacy. Patient states that she needs refills sent to [facility] for her Eliquis. Patient also states that her specialist recommends that she have a kidney and liver profile done in 1 month and to have her pcp order this. Please advise

## 2016-04-11 NOTE — Progress Notes (Signed)
History of Present Illness Wendy Marshall is a 60 y.o. female with history of provoked PE after hip surgery    04/11/2016 Pt. Presents to the office today with improving but  continued shortness of breath and pleuritic chest pain.She had a provoked PE after hip surgery. She is compliant with her Eliquis 5 mg twice daily. She did have an episode of hemoptysis after diagnosis and treatment with Eliquis, but this resolved had has not recurred. She saw Dr. Halford Chessman for the same complaints on 03/21/2016.  She was prescribed prednisone and physical therapy was stopped ,  weight restriction limitations were given to the patient..  She states the prednisone helped her breathing but she still has the chest pain. She states she was able to breath better on the prednisone, but gained about 10 pounds while on the taper. Per Dr. Collie Siad note, she has pulmonary infarcts that correlate with her areas of pleuritic pain. She has been advised not to take NSAIDS due to the recent hemoptysis and anticoagulant therapy. She declined Tylenol #3 today as she does not feel the pain is bad enough to deal with the side effects she has with them. She does not want any more prednisone. In addition, she is due to return to work on 9/25, but is very deconditioned. This will need to be re-evaluated after additional follow up with VQ Scan for resolution of PE .Her husband also shared that the patient  Snores very badly  and sometimes awakens with a dusky color. We will schedule for a sleep study to evaluate for OSA.  Tests: CXR 04/11/2016  IMPRESSION: 1. Further resolution of small right-sided pleural effusion. 2. Lung fields appear clear on today's examination 3. Stable borderline enlargement of the heart size  04/11/2016 VQ scan: FINDINGS: Ventilation: No focal ventilation defect.  Perfusion: No wedge shaped peripheral perfusion defects to suggest acute pulmonary embolism.  IMPRESSION: Negative V/Q scan for pulmonary  embolism.  Past medical hx Past Medical History:  Diagnosis Date  . Arthritis   . Bilateral conjunctivitis    seen by PCP on 01/06/16, patient states Dr Alvan Dame aware   . Complication of anesthesia    slow to wake up with tonsils and tubal ligation   . Fibromyalgia   . GERD (gastroesophageal reflux disease)    occasional   . Hypothyroidism   . PONV (postoperative nausea and vomiting)      Past surgical hx, Family hx, Social hx all reviewed.  Current Outpatient Prescriptions on File Prior to Visit  Medication Sig  . apixaban (ELIQUIS) 5 MG TABS tablet Take 1 tablet (5 mg total) by mouth 2 (two) times daily.  Marland Kitchen HYDROcodone-acetaminophen (NORCO) 7.5-325 MG tablet Take 1 tablet by mouth every 4 (four) hours as needed for pain.  Marland Kitchen levothyroxine (SYNTHROID, LEVOTHROID) 100 MCG tablet TAKE ONE TABLET BY MOUTH ONCE DAILY (Patient taking differently: TAKE 100 MCG BY MOUTH ONCE DAILY)  . Multiple Vitamin (MULTIVITAMIN WITH MINERALS) TABS tablet Take 1 tablet by mouth daily.   No current facility-administered medications on file prior to visit.      Allergies  Allergen Reactions  . Penicillins Hives, Shortness Of Breath and Other (See Comments)    Childhood reaction was told her reaction may have been shortness of breath and hives Has patient had a PCN reaction causing immediate rash, facial/tongue/throat swelling, SOB or lightheadedness with hypotension: yes Has patient had a PCN reaction causing severe rash involving mucus membranes or skin necrosis: no Has patient had a  PCN reaction that required hospitalization no Has patient had a PCN reaction occurring within the last 10 years: no If all of the above answers are "NO", then may proceed   . Contrast Media [Iodinated Diagnostic Agents]     hives  . Flexeril [Cyclobenzaprine] Other (See Comments)    Heart problems - was told she should probably avoid all muscle relaxers   . Omnipaque [Iohexol] Hives and Itching    RN to give pt benadryl   . Soma [Carisoprodol] Other (See Comments)    Heart problems - was told she should probably avoid all muscle relaxers    Review Of Systems:  Constitutional:   No  weight loss, night sweats,  Fevers, chills, +fatigue, or  lassitude.  HEENT:   No headaches,  Difficulty swallowing,  Tooth/dental problems, or  Sore throat,                No sneezing, itching, ear ache, nasal congestion, post nasal drip,   CV:  No chest pain,  Orthopnea, PND, swelling in lower extremities, anasarca, dizziness, palpitations, syncope.   GI  No heartburn, indigestion, abdominal pain, nausea, vomiting, diarrhea, change in bowel habits, loss of appetite, bloody stools.   Resp: + shortness of breath with exertion less  at rest.  No excess mucus, no productive cough,  No non-productive cough,  No coughing up of blood.  No change in color of mucus.  No wheezing.  No chest wall deformity  Skin: no rash or lesions.  GU: no dysuria, change in color of urine, no urgency or frequency.  No flank pain, no hematuria   MS:  No joint pain or swelling.  No decreased range of motion.  No back pain.  Psych:  No change in mood or affect. No depression or anxiety.  No memory loss.   Vital Signs BP 124/86 (BP Location: Right Arm, Cuff Size: Normal)   Pulse 84   Ht 5\' 4"  (1.626 m)   Wt 203 lb (92.1 kg)   SpO2 98%   BMI 34.84 kg/m    Physical Exam:  General- No distress,  A&Ox3, obese and deconditioned ENT: No sinus tenderness, TM clear, pale nasal mucosa, no oral exudate,no post nasal drip, no LAN Cardiac: S1, S2, regular rate and rhythm, no murmur Chest: No wheeze/ rales/ dullness; no accessory muscle use, no nasal flaring, no sternal retractions Abd.: Soft Non-tender Ext: No clubbing cyanosis, edema Neuro:  normal strength Skin: No rashes, warm and dry Psych: normal mood and behavior   Assessment/Plan  PE (pulmonary embolism) Slow to resolve pleuritic pain / dyspnea post provoked  PE Plan: Continue taking  your Eliquis daily as prescribed. We will schedule you for a VQ scan today. Return in 1 week for follow up with Dr. Remi Haggard  To  determine if you can be cleared for work/ Physical Therapy. Call if you decide you want some Tylenol with codeine for pain. We will schedule you for a home sleep study to evaluate you for sleep apnea.. Please contact office for sooner follow up if symptoms do not improve or worsen or seek emergency care    Hypersomnia with sleep apnea Per Husband awakens with duskiness at times and snores loudly. Obese Plan: Sleep Study to evaluate for OSA    Magdalen Spatz, NP 04/11/2016  5:30 PM

## 2016-04-12 ENCOUNTER — Encounter: Payer: Self-pay | Admitting: Pulmonary Disease

## 2016-04-12 NOTE — Telephone Encounter (Signed)
Notes Recorded by Magdalen Spatz, NP on 04/12/2016 at 1:15 PM EDT Please call Ms. Campisi and let her know that her CXR showed continued improvement, and that her VQ scan showed no perfusion deficits ----------------------------------------------------- Spoke with pt over the phone. She is aware of her results. Nothing further was needed.

## 2016-04-12 NOTE — Progress Notes (Signed)
Dg Chest 2 View  Result Date: 04/11/2016 CLINICAL DATA:  Shortness of breath and pleuritic chest pain. EXAM: CHEST  2 VIEW COMPARISON:  03/21/2016, CT chest 03/16/2016 FINDINGS: The heart size and mediastinal contours are stable with borderline enlargement of the heart size. Both lungs are clear. Resolution of small right-sided pleural effusion. The visualized skeletal structures demonstrate no acute osseous abnormality. Cervical hardware is unchanged. IMPRESSION: 1. Further resolution of small right-sided pleural effusion. 2. Lung fields appear clear on today's examination 3. Stable borderline enlargement of the heart size Electronically Signed   By: Donavan Foil M.D.   On: 04/11/2016 12:50   Nm Pulmonary Per & Vent  Result Date: 04/11/2016 CLINICAL DATA:  Shortness of breath and chest pain since left hip replacement surgery July 2013 EXAM: NUCLEAR MEDICINE VENTILATION - PERFUSION LUNG SCAN TECHNIQUE: Ventilation images were obtained in multiple projections using inhaled aerosol Tc-26m DTPA. Perfusion images were obtained in multiple projections after intravenous injection of Tc-54m MAA. RADIOPHARMACEUTICALS:  32.0 mCi Technetium-80m DTPA aerosol inhalation and 5.2 mCi Technetium-30m MAA IV COMPARISON:  None. FINDINGS: Ventilation: No focal ventilation defect. Perfusion: No wedge shaped peripheral perfusion defects to suggest acute pulmonary embolism. IMPRESSION: Negative V/Q scan for pulmonary embolism. Electronically Signed   By: Marijo Sanes M.D.   On: 04/11/2016 14:10     Reviewed and agree with assessment/plan.  Chesley Mires, MD [facility] Pulmonary/Critical Care 04/12/2016, 1:23 AM Pager:  [phone]

## 2016-04-13 ENCOUNTER — Ambulatory Visit (HOSPITAL_COMMUNITY): Payer: BLUE CROSS/BLUE SHIELD | Attending: Physician Assistant | Admitting: Physical Therapy

## 2016-04-13 DIAGNOSIS — M6281 Muscle weakness (generalized): Secondary | ICD-10-CM

## 2016-04-13 DIAGNOSIS — R2681 Unsteadiness on feet: Secondary | ICD-10-CM | POA: Diagnosis present

## 2016-04-13 DIAGNOSIS — M25552 Pain in left hip: Secondary | ICD-10-CM | POA: Diagnosis not present

## 2016-04-13 DIAGNOSIS — R262 Difficulty in walking, not elsewhere classified: Secondary | ICD-10-CM | POA: Insufficient documentation

## 2016-04-13 NOTE — Therapy (Signed)
[place], [place], [address] Phone: [phone]   Fax:  [phone]  Physical Therapy Treatment (Re-Assessment)  Patient Details  Name: Wendy Marshall MRN: [id number] Date of Birth: 1956/07/13 Referring Provider: Gerrit Halls   Encounter Date: 04/13/2016      PT End of Session - 04/13/16 1042    Visit Number 4   Number of Visits 18   Date for PT Re-Evaluation 05/11/16   Authorization Type [employer]    Authorization Time Period [id number] to [id number]; recert done on [id number]   PT Start Time [id number]   PT Stop Time 1027  had to take phone call in middle of session, subtracted time accordingly    PT Time Calculation (min) 38 min   Activity Tolerance Patient tolerated treatment well   Behavior During Therapy [facility] for tasks assessed/performed      Past Medical History:  Diagnosis Date  . Arthritis   . Bilateral conjunctivitis    seen by PCP on 01/06/16, patient states Dr Alvan Dame aware   . Complication of anesthesia    slow to wake up with tonsils and tubal ligation   . Fibromyalgia   . GERD (gastroesophageal reflux disease)    occasional   . Hypothyroidism   . PONV (postoperative nausea and vomiting)     Past Surgical History:  Procedure Laterality Date  . CARPAL TUNNEL RELEASE    . EYE SURGERY    . MANDIBLE SURGERY    . ROOT CANAL    . SHOULDER SURGERY    . SPINAL FUSION  2003  . TONSILLECTOMY    . TOTAL HIP ARTHROPLASTY Left 02/01/2016   Procedure: LEFT TOTAL HIP ARTHROPLASTY ANTERIOR APPROACH;  Surgeon: Paralee Cancel, MD;  Location: [facility];  Service: Orthopedics;  Laterality: Left;  Failed Spinal to LMA  . TUBAL LIGATION      There were no vitals filed for this visit.      Subjective Assessment - 04/13/16 0951    Subjective Patient arrives today stating that she has been through quite a bit; she ended up having multiple blood clots in all 4 lobes of her lung. She does confirm that her DVT testing before PT  evaluation was negative, but per recent MD/hospital stay they think that the DVT had already gone to her lungs before testing was done. Significant symptoms did not appear during skilled PT treatemnts she recieved. She was in the hospital for only two days and is now on Eliquis. Per her specialist MD, she should have had chest testing for PE as well as being on preventative Eliquis, neither of which occurred.    Pertinent History hypothyroidism, fibromyalgia, hx of LBP, hx of cervical fusion, hx of L shoulder surgery    How long can you sit comfortably? 9/13- 15 minutes    How long can you stand comfortably? 9/13- unlimited    How long can you walk comfortably? 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 Yes   Pain Score 3    Pain Location Other (Comment)  L hip; however chest pain is 4-5/10, MD aware    Pain Orientation Left   Pain Descriptors / Indicators Throbbing;Other (Comment)  sometimes throbbing   Pain Type Surgical pain   Pain Radiating Towards runs down ITB area sometimes    Pain  Onset More than a month ago   Pain Frequency Constant   Aggravating Factors  extended walking, extended standing    Pain Relieving Factors rest    Effect of Pain on Daily Activities can't return to work yet, having trouble bending over             [facility] PT Assessment - 04/13/16 0001      Assessment   Medical Diagnosis L anterior hip replacement    Referring Provider Gerrit Halls    Onset Date/Surgical Date 02/01/16   Next MD Visit September 21st with [facility]      Precautions   Precaution Comments anterior hip precautions L      Balance Screen   Has the patient fallen in the past 6 months No   Has the patient had a decrease in activity level because of a fear of falling?  Yes   Is the patient reluctant to leave their home because of a fear of falling?  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    Leisure none      Strength   Right Hip Flexion 4/5   Right Hip Extension 4/5  estimated from funtional bridge    Right Hip ABduction 4+/5   Left Hip Flexion 3/5   Left Hip Extension 4/5  estimated from functional bridge    Left Hip ABduction 3/5   Right Knee Flexion 4+/5   Right Knee Extension 5/5   Left Knee Flexion 4+/5   Left Knee Extension 4+/5   Right Ankle Dorsiflexion 5/5   Left Ankle Dorsiflexion 4+/5     Transfers   Five time sit to stand comments  14.45 seconds      Ambulation/Gait   Gait Comments reduced weight bearing L LE, reduced stance time L LE, proximal muscle weakness, bilateral supination      6 minute walk test results    Aerobic Endurance Distance Walked 561   Endurance additional comments 3MWT no device      Timed Up and Go Test   Normal TUG (seconds) 8.58   TUG Comments no device      High Level Balance   High Level Balance Comments SLS 30 B, stopped by DPT                              PT Education - 04/13/16 1042    Education provided Yes   Education Details Re-eval today due to status change; POC moving forward    Person(s) Educated Patient   Methods Explanation   Comprehension Verbalized understanding          PT Short Term Goals - 04/13/16 1018      PT SHORT TERM GOAL #1   Title Patient to demonstrate equal weight bearing through B LEs with all standing and gait based activities to show general improvement of mobilty and condition    Time 3   Period Weeks   Status On-going     PT SHORT TERM GOAL #2   Title Patient to be able to perform TUG in 10 seconds or less with no AD in order to show improved mobilty and balance    Time 3   Period Weeks   Status Achieved  PT SHORT TERM GOAL #3   Title Patient to be able to perform 5x sit to stand in 10 seconds in order to demonstrate improved mobility and strength    Time 3   Period Weeks   Status On-going     PT SHORT TERM GOAL #4   Title Patient to be independent in correctly and consistetly performing appropriate HEP, to be updated PRN    Baseline 9/13- reports and shows good compliance    Time 3   Period Weeks   Status Achieved           PT Long Term Goals - 04/13/16 1021      PT LONG TERM GOAL #1   Title Patient to demonstrate strength 5/5 in all tested muscle groups in order to reduce pain, improved activity tolerance, and facilitate return to work    Time 6   Period Weeks   Status On-going     PT LONG TERM GOAL #2   Title Patient to experience pain L LE no more than 1/10 in order to improve QOL and facilitate return to work    Time 6   Period Weeks   Status On-going     PT Tina #3   Title Patient to demonstrate correct mechanics for functional lifting with no pain exacerbation in order to allow her to return to work safely    Time 6   Period Weeks   Status On-going     PT LONG TERM GOAL #4   Title Patient to be participatory in regular exercise program, at least 3 days per week, at least 30 minutes in duration, in order to maintain functional gains and assist in improving overall health status    Time 6   Period Weeks   Status On-going               Plan - 04/13/16 1051    Clinical [place] performed today due to patient being out of PT for approximately 1 month and acute change in medical status; although DVT imaging had been performed and was negative, patient reports that she was told her clot had probably already moved up to her chest/lungs before imaging was done. DPT reviewed PT/PTA clinical notes and patient did not appear to experience signs/symptoms of possible PE at any time throughout skilled treatment session provided  previously. Patient now on Eliquis. Upon examination, patient continues to reveal functional weakness, gait impairment with favoring of L LE, reduced functional activity tolerance, and impaired functional lifting mechanics; she also reports lateral shin muscle cramping as well as LBP. Patient reports that she needs to return to work by September 25th or she will lose her insurance coverage. Recommend intense skilled PT services to assist in facilitating successful return to work and reaching optimal level of function.    Rehab Potential Good   PT Frequency Other (comment)  1x/day up to 9/25, then 2x/week afterwards   PT Duration 4 weeks   PT Treatment/Interventions ADLs/Self Care Home Management;Cryotherapy;Moist Heat;DME Instruction;Gait training;Stair training;Functional mobility training;Therapeutic activities;Therapeutic exercise;Balance training;Neuromuscular re-education;Patient/family education;Manual techniques;Scar mobilization;Passive range of motion;Energy conservation;Taping   PT Next Visit Plan focus on functional strength, gait, and functional lifting; needs to be challenged due to time frame for return to work    PT Home Exercise Plan Instructed LE stretches to complete at home following anterior hip replacement precautions   Consulted and Agree with Plan of Care Patient      Patient will benefit from skilled  therapeutic intervention in order to improve the following deficits and impairments:  Abnormal gait, Improper body mechanics, Pain, Decreased coordination, Decreased mobility, Decreased scar mobility, Increased muscle spasms, Postural dysfunction, Decreased activity tolerance, Decreased strength, Decreased balance, Decreased safety awareness, Difficulty walking, Impaired flexibility  Visit Diagnosis: Pain in left hip - Plan: PT plan of care cert/re-cert  Muscle weakness (generalized) - Plan: PT plan of care cert/re-cert  Difficulty in walking, not elsewhere classified - Plan:  PT plan of care cert/re-cert  Unsteadiness on feet - Plan: PT plan of care cert/re-cert     Problem List Patient Active Problem List   Diagnosis Date Noted  . Hypersomnia with sleep apnea 04/11/2016  . PE (pulmonary embolism) 03/09/2016  . Leukocytosis 03/09/2016  . Fever 03/09/2016  . Status post total replacement of left hip 02/01/2016  . Left-sided low back pain with left-sided sciatica 08/06/2015  . Hypothyroidism 08/05/2013  . Fibromyalgia 08/05/2013    Deniece Ree PT, DPT [facility] [address] [place], [place], [address] Phone: [phone]   Fax:  [phone]  Name: Wendy Marshall MRN: [id number] Date of Birth: January 30, 1956

## 2016-04-13 NOTE — Telephone Encounter (Signed)
Called and spoke with pt and she is aware of appt scheduled for her to see MW on 9/22 per SG.

## 2016-04-14 ENCOUNTER — Ambulatory Visit (HOSPITAL_COMMUNITY): Payer: BLUE CROSS/BLUE SHIELD | Admitting: Physical Therapy

## 2016-04-14 DIAGNOSIS — M6281 Muscle weakness (generalized): Secondary | ICD-10-CM

## 2016-04-14 DIAGNOSIS — M25552 Pain in left hip: Secondary | ICD-10-CM

## 2016-04-14 DIAGNOSIS — R2681 Unsteadiness on feet: Secondary | ICD-10-CM

## 2016-04-14 DIAGNOSIS — R262 Difficulty in walking, not elsewhere classified: Secondary | ICD-10-CM

## 2016-04-14 NOTE — Therapy (Signed)
[place], [place], [address] Phone: [phone]   Fax:  [phone]  Physical Therapy Treatment  Patient Details  Name: Wendy Marshall MRN: [id number] Date of Birth: April 22, 1956 Referring Provider: Gerrit Halls   Encounter Date: 04/14/2016      PT End of Session - 04/14/16 1650    Visit Number 5   Number of Visits 18   Date for PT Re-Evaluation 05/11/16   Authorization Type BCBS    Authorization Time Period [id number] to [id number]; recert done on [id number]   PT Start Time 1518   PT Stop Time 1600   PT Time Calculation (min) 42 min   Activity Tolerance Patient tolerated treatment well   Behavior During Therapy [facility] for tasks assessed/performed      Past Medical History:  Diagnosis Date  . Arthritis   . Bilateral conjunctivitis    seen by PCP on 01/06/16, patient states Dr Alvan Dame aware   . Complication of anesthesia    slow to wake up with tonsils and tubal ligation   . Fibromyalgia   . GERD (gastroesophageal reflux disease)    occasional   . Hypothyroidism   . PONV (postoperative nausea and vomiting)     Past Surgical History:  Procedure Laterality Date  . CARPAL TUNNEL RELEASE    . EYE SURGERY    . MANDIBLE SURGERY    . ROOT CANAL    . SHOULDER SURGERY    . SPINAL FUSION  2003  . TONSILLECTOMY    . TOTAL HIP ARTHROPLASTY Left 02/01/2016   Procedure: LEFT TOTAL HIP ARTHROPLASTY ANTERIOR APPROACH;  Surgeon: Paralee Cancel, MD;  Location: [facility];  Service: Orthopedics;  Laterality: Left;  Failed Spinal to LMA  . TUBAL LIGATION      There were no vitals filed for this visit.      Subjective Assessment - 04/14/16 1518    Subjective Pt states that she is doing her exercises twice a day everyday    Pertinent History hypothyroidism, fibromyalgia, hx of LBP, hx of cervical fusion, hx of L shoulder surgery    How long can you sit comfortably? 9/13- 15 minutes    How long can you stand comfortably? 9/13-  unlimited    How long can you walk comfortably? 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 Yes   Pain Score 4    Pain Location Hip   Pain Orientation Left   Pain Descriptors / Indicators Throbbing;Sharp   Pain Type Chronic pain   Pain Onset More than a month ago                         [facility] Adult PT Treatment/Exercise - 04/14/16 0001      Exercises   Exercises Knee/Hip     Knee/Hip Exercises: Stretches   Active Hamstring Stretch Left;3 reps;30 seconds   Gastroc Stretch Both;3 reps;30 seconds   Gastroc Stretch Limitations slant board      Knee/Hip Exercises: Standing   Heel Raises Both;10 reps   Forward Lunges Both;10 reps   Lateral Step Up Left;15 reps;Step Height: 2"   Forward Step Up Left;15 reps;Step Height: 2"   Functional Squat 10 reps   Rocker Board 2 minutes   Rocker Board Limitations A/P and lateral  SLS Rt: 60 seconds; LT 60                 PT Education - 04/13/16 1042    Education provided Yes   Education Details Re-eval today due to status change; POC moving forward    Person(s) Educated Patient   Methods Explanation   Comprehension Verbalized understanding          PT Short Term Goals - 04/13/16 1018      PT SHORT TERM GOAL #1   Title Patient to demonstrate equal weight bearing through B LEs with all standing and gait based activities to show general improvement of mobilty and condition    Time 3   Period Weeks   Status On-going     PT SHORT TERM GOAL #2   Title Patient to be able to perform TUG in 10 seconds or less with no AD in order to show improved mobilty and balance    Time 3   Period Weeks   Status Achieved     PT SHORT TERM GOAL #3   Title Patient to be able to perform 5x sit to stand in 10 seconds in order to demonstrate improved mobility and strength    Time 3   Period Weeks    Status On-going     PT SHORT TERM GOAL #4   Title Patient to be independent in correctly and consistetly performing appropriate HEP, to be updated PRN    Baseline 9/13- reports and shows good compliance    Time 3   Period Weeks   Status Achieved           PT Long Term Goals - 04/13/16 1021      PT LONG TERM GOAL #1   Title Patient to demonstrate strength 5/5 in all tested muscle groups in order to reduce pain, improved activity tolerance, and facilitate return to work    Time 6   Period Weeks   Status On-going     PT LONG TERM GOAL #2   Title Patient to experience pain L LE no more than 1/10 in order to improve QOL and facilitate return to work    Time 6   Period Weeks   Status On-going     PT LONG TERM GOAL #3   Title Patient to demonstrate correct mechanics for functional lifting with no pain exacerbation in order to allow her to return to work safely    Time 6   Period Weeks   Status On-going     PT LONG TERM GOAL #4   Title Patient to be participatory in regular exercise program, at least 3 days per week, at least 30 minutes in duration, in order to maintain functional gains and assist in improving overall health status    Time 6   Period Weeks   Status On-going               Plan - 04/14/16 1650    Clinical Impression Statement Pt instructed in step up, step downs and lunges to progress functional strength.  Pt needed to decrease to 2"step up due to 4" causing pain.  Pt able to demonstrate proper technique of exercises with verbal and manual cuing    Rehab Potential Good   PT Frequency Other (comment)  1x/day up to 9/25, then 2x/week afterwards   PT Duration 4 weeks   PT Treatment/Interventions ADLs/Self Care Home Management;Cryotherapy;Moist Heat;DME Instruction;Gait training;Stair training;Functional mobility training;Therapeutic activities;Therapeutic exercise;Balance training;Neuromuscular re-education;Patient/family education;Manual techniques;Scar  mobilization;Passive  range of motion;Energy conservation;Taping   PT Next Visit Plan focus on functional strength, gait, and functional lifting; needs to be challenged due to time frame for return to work    PT Home Exercise Plan Instructed LE stretches to complete at home following anterior hip replacement precautions   Consulted and Agree with Plan of Care Patient      Patient will benefit from skilled therapeutic intervention in order to improve the following deficits and impairments:  Abnormal gait, Improper body mechanics, Pain, Decreased coordination, Decreased mobility, Decreased scar mobility, Increased muscle spasms, Postural dysfunction, Decreased activity tolerance, Decreased strength, Decreased balance, Decreased safety awareness, Difficulty walking, Impaired flexibility  Visit Diagnosis: Pain in left hip  Muscle weakness (generalized)  Difficulty in walking, not elsewhere classified  Unsteadiness on feet     Problem List Patient Active Problem List   Diagnosis Date Noted  . Hypersomnia with sleep apnea 04/11/2016  . PE (pulmonary embolism) 03/09/2016  . Leukocytosis 03/09/2016  . Fever 03/09/2016  . Status post total replacement of left hip 02/01/2016  . Left-sided low back pain with left-sided sciatica 08/06/2015  . Hypothyroidism 08/05/2013  . Fibromyalgia 08/05/2013    Rayetta Humphrey, PT CLT [phone] 04/14/2016, 4:53 PM  [place] [address] [place], [place], [address] Phone: [phone]   Fax:  [phone]  Name: Wendy Marshall MRN: [id number] Date of Birth: 05-Dec-1955

## 2016-04-15 ENCOUNTER — Ambulatory Visit (HOSPITAL_COMMUNITY): Payer: BLUE CROSS/BLUE SHIELD

## 2016-04-15 DIAGNOSIS — R2681 Unsteadiness on feet: Secondary | ICD-10-CM

## 2016-04-15 DIAGNOSIS — R262 Difficulty in walking, not elsewhere classified: Secondary | ICD-10-CM

## 2016-04-15 DIAGNOSIS — M6281 Muscle weakness (generalized): Secondary | ICD-10-CM

## 2016-04-15 DIAGNOSIS — M25552 Pain in left hip: Secondary | ICD-10-CM

## 2016-04-15 NOTE — Therapy (Signed)
[place], [place], [address] Phone: [phone]   Fax:  [phone]  Physical Therapy Treatment  Patient Details  Name: Wendy Marshall MRN: [id number] Date of Birth: 14-Jun-1956 Referring Provider: Gerrit Halls   Encounter Date: 04/15/2016      PT End of Session - 04/15/16 1049    Visit Number 6   Number of Visits 18   Date for PT Re-Evaluation 05/11/16   Authorization Type BCBS    Authorization Time Period [id number] to [id number]; recert done on [id number]   PT Start Time 1027   PT Stop Time 1107   PT Time Calculation (min) 40 min   Equipment Utilized During Treatment Gait belt   Activity Tolerance Patient tolerated treatment well   Behavior During Therapy [facility] for tasks assessed/performed      Past Medical History:  Diagnosis Date  . Arthritis   . Bilateral conjunctivitis    seen by PCP on 01/06/16, patient states Dr Alvan Dame aware   . Complication of anesthesia    slow to wake up with tonsils and tubal ligation   . Fibromyalgia   . GERD (gastroesophageal reflux disease)    occasional   . Hypothyroidism   . PONV (postoperative nausea and vomiting)     Past Surgical History:  Procedure Laterality Date  . CARPAL TUNNEL RELEASE    . EYE SURGERY    . MANDIBLE SURGERY    . ROOT CANAL    . SHOULDER SURGERY    . SPINAL FUSION  2003  . TONSILLECTOMY    . TOTAL HIP ARTHROPLASTY Left 02/01/2016   Procedure: LEFT TOTAL HIP ARTHROPLASTY ANTERIOR APPROACH;  Surgeon: Paralee Cancel, MD;  Location: [facility];  Service: Orthopedics;  Laterality: Left;  Failed Spinal to LMA  . TUBAL LIGATION      There were no vitals filed for this visit.      Subjective Assessment - 04/15/16 1033    Subjective Pt doing ok today. She continues to work on her HEP twice daily without complaint. Pain is getting beterr and stiffness is slightly improving.    Pertinent History hypothyroidism, fibromyalgia, hx of LBP, hx of cervical fusion, hx  of L shoulder surgery                          [facility] Adult PT Treatment/Exercise - 04/15/16 0001      Ambulation/Gait   Ambulation Distance (Feet) 900 Feet   Gait velocity 0.73m/s     Exercises   Exercises Knee/Hip     Knee/Hip Exercises: Stretches   Hip Flexor Stretch Both  Thomas Test Pose Hip at 0 degrees extension     Knee/Hip Exercises: Standing   Lateral Step Up Step Height: 4";Left;2 sets;10 reps   Forward Step Up 2 sets;10 reps;Step Height: 4"   Other Standing Knee Exercises Narrow foam trunk rotation c 10lb ball : 2x60s   Other Standing Knee Exercises SLS on foam 5x10sec bilat     Knee/Hip Exercises: Seated   Long Arc Quad 1 set;10 reps;Left;Weights  5# (rectus not as affected (try supine next session)      Knee/Hip Exercises: Supine   Bridges with Clamshell 10 reps;Both;Strengthening;1 set  redTB; ball bwtn feet (try 2 sets next time)   Straight Leg Raises Left;1 set;5 reps  min-modA due to rectus psoreness/weakness                  PT  Short Term Goals - 04/13/16 1018      PT SHORT TERM GOAL #1   Title Patient to demonstrate equal weight bearing through B LEs with all standing and gait based activities to show general improvement of mobilty and condition    Time 3   Period Weeks   Status On-going     PT SHORT TERM GOAL #2   Title Patient to be able to perform TUG in 10 seconds or less with no AD in order to show improved mobilty and balance    Time 3   Period Weeks   Status Achieved     PT SHORT TERM GOAL #3   Title Patient to be able to perform 5x sit to stand in 10 seconds in order to demonstrate improved mobility and strength    Time 3   Period Weeks   Status On-going     PT SHORT TERM GOAL #4   Title Patient to be independent in correctly and consistetly performing appropriate HEP, to be updated PRN    Baseline 9/13- reports and shows good compliance    Time 3   Period Weeks   Status Achieved           PT Long  Term Goals - 04/13/16 1021      PT LONG TERM GOAL #1   Title Patient to demonstrate strength 5/5 in all tested muscle groups in order to reduce pain, improved activity tolerance, and facilitate return to work    Time 6   Period Weeks   Status On-going     PT LONG TERM GOAL #2   Title Patient to experience pain L LE no more than 1/10 in order to improve QOL and facilitate return to work    Time 6   Period Weeks   Status On-going     PT LONG TERM GOAL #3   Title Patient to demonstrate correct mechanics for functional lifting with no pain exacerbation in order to allow her to return to work safely    Time 6   Period Weeks   Status On-going     PT LONG TERM GOAL #4   Title Patient to be participatory in regular exercise program, at least 3 days per week, at least 30 minutes in duration, in order to maintain functional gains and assist in improving overall health status    Time 6   Period Weeks   Status On-going               Plan - 04/15/16 1050    Clinical Impression Statement Pt tolerating session well. Able to progress teps to 4" height without difficulty. Balance is progressed to foam and dynamic activities. Pain and stiffness remain consistent and unexacerbated. Pt making good progress toward goals. Thoas test stretchign reveals no patent shortness in Left hip flexors but pt endorses feeling some stretch of the surgical scar area.    Rehab Potential Good   PT Frequency Other (comment)   PT Duration 4 weeks   PT Treatment/Interventions ADLs/Self Care Home Management;Cryotherapy;Moist Heat;DME Instruction;Gait training;Stair training;Functional mobility training;Therapeutic activities;Therapeutic exercise;Balance training;Neuromuscular re-education;Patient/family education;Manual techniques;Scar mobilization;Passive range of motion;Energy conservation;Taping   PT Next Visit Plan continue with L hip flexor strength, progress stepping as able (reps before height), simulated work  lifting.    Consulted and Agree with Plan of Care Patient      Patient will benefit from skilled therapeutic intervention in order to improve the following deficits and impairments:  Abnormal gait, Improper body  mechanics, Pain, Decreased coordination, Decreased mobility, Decreased scar mobility, Increased muscle spasms, Postural dysfunction, Decreased activity tolerance, Decreased strength, Decreased balance, Decreased safety awareness, Difficulty walking, Impaired flexibility  Visit Diagnosis: Pain in left hip  Muscle weakness (generalized)  Difficulty in walking, not elsewhere classified  Unsteadiness on feet     Problem List Patient Active Problem List   Diagnosis Date Noted  . Hypersomnia with sleep apnea 04/11/2016  . PE (pulmonary embolism) 03/09/2016  . Leukocytosis 03/09/2016  . Fever 03/09/2016  . Status post total replacement of left hip 02/01/2016  . Left-sided low back pain with left-sided sciatica 08/06/2015  . Hypothyroidism 08/05/2013  . Fibromyalgia 08/05/2013    11:10 AM, 04/15/16 Etta Grandchild, PT, DPT Physical Therapist at [facility] [phone] (office)     [place] [address] [place], [place], [address] Phone: [phone]   Fax:  [phone]  Name: Wendy Marshall MRN: [id number] Date of Birth: [birth year]

## 2016-04-18 ENCOUNTER — Ambulatory Visit (HOSPITAL_COMMUNITY): Payer: BLUE CROSS/BLUE SHIELD | Admitting: Physical Therapy

## 2016-04-18 DIAGNOSIS — R262 Difficulty in walking, not elsewhere classified: Secondary | ICD-10-CM

## 2016-04-18 DIAGNOSIS — M25552 Pain in left hip: Secondary | ICD-10-CM

## 2016-04-18 DIAGNOSIS — M6281 Muscle weakness (generalized): Secondary | ICD-10-CM

## 2016-04-18 DIAGNOSIS — R2681 Unsteadiness on feet: Secondary | ICD-10-CM

## 2016-04-18 NOTE — Therapy (Signed)
[place], [place], [address] Phone: [phone]   Fax:  [phone]  Physical Therapy Treatment  Patient Details  Name: Wendy Marshall MRN: [id number] Date of Birth: [birth year] Referring Provider: Gerrit Halls   Encounter Date: 04/18/2016      PT End of Session - 04/18/16 1127    Visit Number 7   Number of Visits 18   Date for PT Re-Evaluation 05/11/16   Authorization Type BCBS    Authorization Time Period [id number] to [id number]; recert done on [id number]   PT Start Time 1031   PT Stop Time 1109   PT Time Calculation (min) 38 min   Activity Tolerance Patient tolerated treatment well   Behavior During Therapy [facility] for tasks assessed/performed      Past Medical History:  Diagnosis Date  . Arthritis   . Bilateral conjunctivitis    seen by PCP on 01/06/16, patient states Dr Alvan Dame aware   . Complication of anesthesia    slow to wake up with tonsils and tubal ligation   . Fibromyalgia   . GERD (gastroesophageal reflux disease)    occasional   . Hypothyroidism   . PONV (postoperative nausea and vomiting)     Past Surgical History:  Procedure Laterality Date  . CARPAL TUNNEL RELEASE    . EYE SURGERY    . MANDIBLE SURGERY    . ROOT CANAL    . SHOULDER SURGERY    . SPINAL FUSION  2003  . TONSILLECTOMY    . TOTAL HIP ARTHROPLASTY Left 02/01/2016   Procedure: LEFT TOTAL HIP ARTHROPLASTY ANTERIOR APPROACH;  Surgeon: Paralee Cancel, MD;  Location: [facility];  Service: Orthopedics;  Laterality: Left;  Failed Spinal to LMA  . TUBAL LIGATION      There were no vitals filed for this visit.      Subjective Assessment - 04/18/16 1037    Subjective Patient arrives today stating she is feeling well but she is more SOB than her norm, it started this morning and her HR has been up somewhat this morning too. She confirms that she is still on Eliquis.    Pertinent History hypothyroidism, fibromyalgia, hx of LBP, hx of cervical  fusion, hx of L shoulder surgery    Currently in Pain? Yes   Pain Score 3    Pain Location Hip   Pain Orientation Left            [facility] PT Assessment - 04/18/16 0001      Observation/Other Assessments   Observations O2 98% at rest, HR at 106BPM, BP 143/92 per autocuff   all measures taken at rest and on room air                      [facility] Adult PT Treatment/Exercise - 04/18/16 0001      Knee/Hip Exercises: Standing   Heel Raises Both;1 set;15 reps   Heel Raises Limitations heel and toe    Forward Lunges Left;1 set;10 reps   Lateral Step Up Both;1 set;10 reps   Lateral Step Up Limitations 4 inch box    Forward Step Up Both;2 sets;10 reps   Forward Step Up Limitations 4 inch box    Step Down Both;1 set;10 reps   Step Down Limitations 4 inch box R LE, 2 inch box L LE    Rocker Board 2 minutes   Rocker Board Limitations A/P and lateral   Other Standing Knee Exercises speed  walking x3 laps before request for rest    Other Standing Knee Exercises sit to stand staggered stance, no UEs, 2x10; functional lifting 1x10 with 10#, correct mechanics      Knee/Hip Exercises: Supine   Bridges Both;2 sets;20 reps   Other Supine Knee/Hip Exercises supine hip ABD with red TB 2x10                PT Education - 04/18/16 1126    Education provided Yes   Education Details encouraged to scheduled appts at 1-2x/week when she returns to work    [employer]) Educated Patient   Methods Explanation   Comprehension Verbalized understanding          PT Short Term Goals - 04/13/16 1018      PT SHORT TERM GOAL #1   Title Patient to demonstrate equal weight bearing through B LEs with all standing and gait based activities to show general improvement of mobilty and condition    Time 3   Period Weeks   Status On-going     PT SHORT TERM GOAL #2   Title Patient to be able to perform TUG in 10 seconds or less with no AD in order to show improved mobilty and balance    Time 3    Period Weeks   Status Achieved     PT SHORT TERM GOAL #3   Title Patient to be able to perform 5x sit to stand in 10 seconds in order to demonstrate improved mobility and strength    Time 3   Period Weeks   Status On-going     PT SHORT TERM GOAL #4   Title Patient to be independent in correctly and consistetly performing appropriate HEP, to be updated PRN    Baseline 9/13- reports and shows good compliance    Time 3   Period Weeks   Status Achieved           PT Long Term Goals - 04/13/16 1021      PT LONG TERM GOAL #1   Title Patient to demonstrate strength 5/5 in all tested muscle groups in order to reduce pain, improved activity tolerance, and facilitate return to work    Time 6   Period Weeks   Status On-going     PT LONG TERM GOAL #2   Title Patient to experience pain L LE no more than 1/10 in order to improve QOL and facilitate return to work    Time 6   Period Weeks   Status On-going     PT LONG TERM GOAL #3   Title Patient to demonstrate correct mechanics for functional lifting with no pain exacerbation in order to allow her to return to work safely    Time 6   Period Weeks   Status On-going     PT LONG TERM GOAL #4   Title Patient to be participatory in regular exercise program, at least 3 days per week, at least 30 minutes in duration, in order to maintain functional gains and assist in improving overall health status    Time 6   Period Weeks   Status On-going               Plan - 04/18/16 1127    Clinical Impression Statement Patient arrives today stating she has been feeling more SOB than normal today however she remains on the Eliquis as prescribed by her MD and per resting assessment all vitals normal with exception of slightly high resting  HR. Continued with functional exercises with monitoring of patient for worsening of symptoms, as concerns of clot/PE should be addressed by her consistent use of blood thinners at this time. HR did increase  to 126BPM after speed walking, or approximately 80% of max, with patient request to stop after 3 laps; however HR and feelings of exertion normalized after seated rest break. No increase in pain or severity of SOB at end of session.   Rehab Potential Good   PT Frequency Other (comment)   PT Duration 4 weeks   PT Treatment/Interventions ADLs/Self Care Home Management;Cryotherapy;Moist Heat;DME Instruction;Gait training;Stair training;Functional mobility training;Therapeutic activities;Therapeutic exercise;Balance training;Neuromuscular re-education;Patient/family education;Manual techniques;Scar mobilization;Passive range of motion;Energy conservation;Taping   PT Next Visit Plan continue with functional strength, functional lifting; stepping as able (reps before height), simulated work lifting    PT Home Exercise Plan Instructed LE stretches to complete at home following anterior hip replacement precautions   Consulted and Agree with Plan of Care Patient      Patient will benefit from skilled therapeutic intervention in order to improve the following deficits and impairments:  Abnormal gait, Improper body mechanics, Pain, Decreased coordination, Decreased mobility, Decreased scar mobility, Increased muscle spasms, Postural dysfunction, Decreased activity tolerance, Decreased strength, Decreased balance, Decreased safety awareness, Difficulty walking, Impaired flexibility  Visit Diagnosis: Pain in left hip  Muscle weakness (generalized)  Difficulty in walking, not elsewhere classified  Unsteadiness on feet     Problem List Patient Active Problem List   Diagnosis Date Noted  . Hypersomnia with sleep apnea 04/11/2016  . PE (pulmonary embolism) 03/09/2016  . Leukocytosis 03/09/2016  . Fever 03/09/2016  . Status post total replacement of left hip 02/01/2016  . Left-sided low back pain with left-sided sciatica 08/06/2015  . Hypothyroidism 08/05/2013  . Fibromyalgia 08/05/2013     Deniece Ree PT, DPT [place] [address] [place], [place], [address] Phone: [phone]   Fax:  [phone]  Name: KITINA DIVITA MRN: [id number] Date of Birth: [birth year]

## 2016-04-19 ENCOUNTER — Ambulatory Visit (HOSPITAL_COMMUNITY): Payer: BLUE CROSS/BLUE SHIELD

## 2016-04-19 DIAGNOSIS — M25552 Pain in left hip: Secondary | ICD-10-CM | POA: Diagnosis not present

## 2016-04-19 DIAGNOSIS — M6281 Muscle weakness (generalized): Secondary | ICD-10-CM

## 2016-04-19 DIAGNOSIS — R262 Difficulty in walking, not elsewhere classified: Secondary | ICD-10-CM

## 2016-04-19 DIAGNOSIS — R2681 Unsteadiness on feet: Secondary | ICD-10-CM

## 2016-04-19 NOTE — Therapy (Signed)
[facility] [place], [place], [address] Phone: [phone]   Fax:  [phone]  Physical Therapy Treatment  Patient Details  Name: Wendy [facility] MRN: [id number] Date of Birth: 15-Sep-1955 Referring Provider: Gerrit Halls  Encounter Date: 04/19/2016      PT End of Session - 04/19/16 1617    Visit Number 8   Number of Visits 18   Date for PT Re-Evaluation 05/11/16   Authorization Type BCBS    Authorization Time Period [id number] to [id number]; recert done on [id number]   PT Start Time 1302   PT Stop Time 1347   PT Time Calculation (min) 45 min   Equipment Utilized During Treatment Gait belt   Activity Tolerance Patient tolerated treatment well   Behavior During Therapy [facility] for tasks assessed/performed      Past Medical History:  Diagnosis Date  . Arthritis   . Bilateral conjunctivitis    seen by PCP on 01/06/16, patient states Dr Alvan Dame aware   . Complication of anesthesia    slow to wake up with tonsils and tubal ligation   . Fibromyalgia   . GERD (gastroesophageal reflux disease)    occasional   . Hypothyroidism   . PONV (postoperative nausea and vomiting)     Past Surgical History:  Procedure Laterality Date  . CARPAL TUNNEL RELEASE    . EYE SURGERY    . MANDIBLE SURGERY    . ROOT CANAL    . SHOULDER SURGERY    . SPINAL FUSION  2003  . TONSILLECTOMY    . TOTAL HIP ARTHROPLASTY Left 02/01/2016   Procedure: LEFT TOTAL HIP ARTHROPLASTY ANTERIOR APPROACH;  Surgeon: Paralee Cancel, MD;  Location: [facility];  Service: Orthopedics;  Laterality: Left;  Failed Spinal to LMA  . TUBAL LIGATION      There were no vitals filed for this visit.      Subjective Assessment - 04/19/16 1305    Subjective The bones in my legs are aching.  She was up all night with it.  Pt still feeling off today, but not as bad as yesterday.  Resting HR: 106bpm, SpO2: 98% on RA.  Pt states that she will have to make Wednesday her last tx because they  have had a death in the family and wont be able to make it on Friday.     Pertinent History hypothyroidism, fibromyalgia, hx of LBP, hx of cervical fusion, hx of L shoulder surgery    How long can you sit comfortably? 9/13- 15 minutes    How long can you stand comfortably? 9/13- unlimited    How long can you walk comfortably? 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    Pain Onset More than a month ago   Pain Frequency Constant   Aggravating Factors  extended walking, extended standing   Pain Relieving Factors rest   Effect of Pain on Daily Activities can't return to work yet, having trouble bending over.     Multiple Pain Sites Yes   Pain Score 4   Pain Location Chest  Constant.  [facility] PT Assessment - 04/19/16 0001      Assessment   Medical Diagnosis L anterior hip replacement    Referring Provider Gerrit Halls   Onset Date/Surgical Date 02/01/16   Next MD Visit September 21st with Chabon/Olin, and on the 22nd with the pulmonology.       Precautions   Precaution Comments anterior hip precautions L      Balance Screen   Has the patient fallen in the past 6 months No   Has the patient had a decrease in activity level because of a fear of falling?  Yes   Is the patient reluctant to leave their home because of a fear of falling?  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  HR increased to 123bpm during short burst of physical activity.       6 minute walk test results    Aerobic Endurance Distance Walked 678   Endurance additional comments 3MWT no device with HR up to 120bpm and SpO2 97%.     Standardized Balance Assessment   Five times sit to stand comments  arms crossed over chest .  trial 1) 10.19 sec, and trial 2) 8.31 sec.                      [facility] PT Treatment/Exercise - 04/19/16 0001      High Level Balance   High Level Balance Activities Tandem walking  on foam going fwd x 4   High Level Balance Comments Retro tandem walking on foam x 2     Knee/Hip Exercises: Standing   Rocker Board 2 minutes  balance A/P   SLS 1 min each   Other Standing Knee Exercises Lifting large box with proper lifting techniques x 5 reps each with HR elevated to 123bpm and need for seated rest.  After 1-2 min rest down to 96bpm.     Knee/Hip Exercises: Supine   Bridges Strengthening;Both;1 set;20 reps   Bridges with Clamshell Both;1 set;20 reps     Knee/Hip Exercises: Sidelying   Clams x20 with red TB, reverse clams x 20 with R TB   Other Sidelying Knee/Hip Exercises L Single leg extension in S/L to work glute x x10 with R TB.                  PT Education - 04/19/16 1616    Education provided Yes   Education Details Encouraged pt to share information about elevated HR with her orthopedist, as well as the pulmonologist when she goes on Thursday and Friday of this week.Terence Lux) Educated Patient   Methods Explanation   Comprehension Verbalized understanding          PT Short Term Goals - 04/19/16 1627      PT SHORT TERM GOAL #1   Title Patient to demonstrate equal weight bearing through B LEs with all standing and gait based activities to show general improvement of mobilty and condition    Time 3   Period Weeks   Status On-going     PT SHORT TERM GOAL #2   Title Patient to be able to perform TUG in  10  seconds or less with no AD in order to show improved mobilty and balance    Time 3   Period Weeks   Status Achieved     PT SHORT TERM GOAL #3   Title Patient to be able to perform 5x sit to stand in 10 seconds in order to demonstrate improved mobility and strength    Baseline 9/19 - 8.31 seconds   Time 3   Period Weeks   Status Achieved     PT SHORT TERM GOAL #4   Title Patient to be independent in correctly and consistetly performing appropriate HEP, to be updated PRN    Baseline 9/13- reports and shows good compliance    Time 3   Period Weeks   Status Achieved           PT Long Term Goals - 04/19/16 1628      PT LONG TERM GOAL #1   Title Patient to demonstrate strength 5/5 in all tested muscle groups in order to reduce pain, improved activity tolerance, and facilitate return to work    Time 6   Period Weeks   Status On-going     PT LONG TERM GOAL #2   Title Patient to experience pain L LE no more than 1/10 in order to improve QOL and facilitate return to work    Time 6   Period Weeks   Status On-going     PT LONG TERM GOAL #3   Title Patient to demonstrate correct mechanics for functional lifting with no pain exacerbation in order to allow her to return to work safely    Time 6   Period Weeks   Status On-going     PT LONG TERM GOAL #4   Title Patient to be participatory in regular exercise program, at least 3 days per week, at least 30 minutes in duration, in order to maintain functional gains and assist in improving overall health status    Time 6   Period Weeks               Plan - 04/19/16 1618    Clinical Impression Statement Upon arrival today, pt expressed that she was feeling better than she was yesterday, however, she continues to have elevated resting HR at 106bpm at rest.  This increases up to 123bpm when performing short bursts of aerobic activity, and is able to return to baseline with seated rest break.  Pt states that she is still hoping to  return to work on Sept 25.  However, I am concerned that she continues to have chest pain, as well as elevated HR with short bursts of physical activity.  Her occupation is a very physically demanding job where she has to lift up to 45 lbs 20 times every 2-3 minutes.  She states that she is walking at the mall to work on her endurance.  Would be interested to see what her pulmonologist says regarding this on Friday, however she states that she will have to have her last visit on 11-06-2022 due to a death in the family.  Pt also continues to have noticeably antalgic gait which was noted during her 3MWT today.  May want to check leg length.  Pt needs to continue progressing simulated work lifting activities, and would benefit from continued PT to address these issues.     Rehab Potential Good   Clinical Impairments Affecting Rehab Potential Elevated resting HR with recent history of PE.  Pt has had a  death in the family, and will be missing tx on Friday.    PT Frequency Other (comment)  1-2   PT Duration 4 weeks   PT Treatment/Interventions ADLs/Self Care Home Management;Cryotherapy;Moist Heat;DME Instruction;Gait training;Stair training;Functional mobility training;Therapeutic activities;Therapeutic exercise;Balance training;Neuromuscular re-education;Patient/family education;Manual techniques;Scar mobilization;Passive range of motion;Energy conservation;Taping   PT Next Visit Plan Possible d/c as pt has no more scheduled visits, and states that her last day will be Wednesday???  continue with simulated work lifting while monitoring HR.  Check leg length with continued antalgic gait.    PT Home Exercise Plan none added today.     Consulted and Agree with Plan of Care Patient      Patient will benefit from skilled therapeutic intervention in order to improve the following deficits and impairments:  Abnormal gait, Improper body mechanics, Pain, Decreased coordination, Decreased mobility, Decreased scar  mobility, Increased muscle spasms, Postural dysfunction, Decreased activity tolerance, Decreased strength, Decreased balance, Decreased safety awareness, Difficulty walking, Impaired flexibility  Visit Diagnosis: Pain in left hip  Muscle weakness (generalized)  Difficulty in walking, not elsewhere classified  Unsteadiness on feet     Problem List Patient Active Problem List   Diagnosis Date Noted  . Hypersomnia with sleep apnea 04/11/2016  . PE (pulmonary embolism) 03/09/2016  . Leukocytosis 03/09/2016  . Fever 03/09/2016  . Status post total replacement of left hip 02/01/2016  . Left-sided low back pain with left-sided sciatica 08/06/2015  . Hypothyroidism 08/05/2013  . Fibromyalgia 08/05/2013    Beth Barbara Ahart, PT, DPT X: [phone]   [facility] [address] [place], [place], [address] Phone: [phone]   Fax:  [phone]  Name: MAGDA BOLENBAUGH MRN: [id number] Date of Birth: 07-03-1956

## 2016-04-20 ENCOUNTER — Ambulatory Visit (HOSPITAL_COMMUNITY): Payer: BLUE CROSS/BLUE SHIELD | Admitting: Physical Therapy

## 2016-04-20 DIAGNOSIS — R2681 Unsteadiness on feet: Secondary | ICD-10-CM

## 2016-04-20 DIAGNOSIS — M25552 Pain in left hip: Secondary | ICD-10-CM

## 2016-04-20 DIAGNOSIS — M6281 Muscle weakness (generalized): Secondary | ICD-10-CM

## 2016-04-20 DIAGNOSIS — R262 Difficulty in walking, not elsewhere classified: Secondary | ICD-10-CM

## 2016-04-20 NOTE — Therapy (Signed)
[place], [place], [address] Phone: [phone]   Fax:  [phone]  Physical Therapy Treatment  Patient Details  Name: Wendy [facility] MRN: [id number] Date of Birth: 11/25/1955 Referring Provider: Gerrit Halls  Encounter Date: 04/20/2016      PT End of Session - 04/20/16 1154    Visit Number 9   Number of Visits 18   Date for PT Re-Evaluation 05/11/16   Authorization Type BCBS    Authorization Time Period [id number] to [id number]; recert done on [id number]   PT Start Time 1035   PT Stop Time 1113   PT Time Calculation (min) 38 min   Activity Tolerance Patient tolerated treatment well;Patient limited by fatigue   Behavior During Therapy [facility] for tasks assessed/performed      Past Medical History:  Diagnosis Date  . Arthritis   . Bilateral conjunctivitis    seen by PCP on 01/06/16, patient states Dr Alvan Dame aware   . Complication of anesthesia    slow to wake up with tonsils and tubal ligation   . Fibromyalgia   . GERD (gastroesophageal reflux disease)    occasional   . Hypothyroidism   . PONV (postoperative nausea and vomiting)     Past Surgical History:  Procedure Laterality Date  . CARPAL TUNNEL RELEASE    . EYE SURGERY    . MANDIBLE SURGERY    . ROOT CANAL    . SHOULDER SURGERY    . SPINAL FUSION  2003  . TONSILLECTOMY    . TOTAL HIP ARTHROPLASTY Left 02/01/2016   Procedure: LEFT TOTAL HIP ARTHROPLASTY ANTERIOR APPROACH;  Surgeon: Paralee Cancel, MD;  Location: [facility];  Service: Orthopedics;  Laterality: Left;  Failed Spinal to LMA  . TUBAL LIGATION      There were no vitals filed for this visit.      Subjective Assessment - 04/20/16 1036    Subjective Patient arrives stating no major changes since yesterday; she is still having bone aching in her lower legs, which started 2-3 days ago. She continues to state that today will have to be her last session for the week due to a death in the family. She is  not real confident about return to work.    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 Yes   Pain Score 2    Pain Location Hip   Pain Orientation Left                         [facility] Adult PT Treatment/Exercise - 04/20/16 0001      Knee/Hip Exercises: Standing   Heel Raises Both;1 set;20 reps   Heel Raises Limitations heel and toe    Forward Lunges Both;1 set;15 reps   Forward Lunges Limitations 4 inch box    Lateral Step Up Both;1 set;10 reps   Lateral Step Up Limitations 6 inch box    Forward Step Up Both;1 set;10 reps   Forward Step Up Limitations 6 inch box    Step Down Both;1 set;10 reps   Step Down Limitations 4 inch box    Other Standing Knee Exercises 628ft speed walking x2, HR up to 126 each round    Other Standing Knee Exercises lifting large boxes to shoulder height 2x6;  HR up to 136 each round                 PT Education - 04/20/16 1153    Education provided Yes   Education Details education regarding HR mechanics as well as effect of possible dehydration on HR; discussed HR max/HR percentages and relation to amount of effort she is doing and importance of activity and rest as tolerated/needed. Continued to encourage sharing HR information with MD at upcoming appointment.    Person(s) Educated Patient   Methods Explanation   Comprehension Verbalized understanding          PT Short Term Goals - 04/19/16 1627      PT SHORT TERM GOAL #1   Title Patient to demonstrate equal weight bearing through B LEs with all standing and gait based activities to show general improvement of mobilty and condition    Time 3   Period Weeks   Status On-going     PT SHORT TERM GOAL #2   Title Patient to be able to perform TUG in 10 seconds or less with no AD in order to show improved mobilty and  balance    Time 3   Period Weeks   Status Achieved     PT SHORT TERM GOAL #3   Title Patient to be able to perform 5x sit to stand in 10 seconds in order to demonstrate improved mobility and strength    Baseline 9/19 - 8.31 seconds   Time 3   Period Weeks   Status Achieved     PT SHORT TERM GOAL #4   Title Patient to be independent in correctly and consistetly performing appropriate HEP, to be updated PRN    Baseline 9/13- reports and shows good compliance    Time 3   Period Weeks   Status Achieved           PT Long Term Goals - 04/19/16 1628      PT LONG TERM GOAL #1   Title Patient to demonstrate strength 5/5 in all tested muscle groups in order to reduce pain, improved activity tolerance, and facilitate return to work    Time 6   Period Weeks   Status On-going     PT LONG TERM GOAL #2   Title Patient to experience pain L LE no more than 1/10 in order to improve QOL and facilitate return to work    Time 6   Period Weeks   Status On-going     PT LONG TERM GOAL #3   Title Patient to demonstrate correct mechanics for functional lifting with no pain exacerbation in order to allow her to return to work safely    Time 6   Period Weeks   Status On-going     PT LONG TERM GOAL #4   Title Patient to be participatory in regular exercise program, at least 3 days per week, at least 30 minutes in duration, in order to maintain functional gains and assist in improving overall health status    Time 6   Period Weeks               Plan - 04/20/16 1154    Clinical Impression Statement Continued with functional strengthening, endurance activities, and functional lifting tasks to assist in return to work. Patient continues to experience HR elevation with simple activities, as much as 136BPM today and rest breaks provided PRN; HR does decrease to resting (103-106BPM) as expected with seated rest break however does  continue to be a limiting factor in activity. Educated regarding  basic cardiopulm concepts, HR max and percentages in relation to physical effort, and continued to encourage patient to speak to MD about recent HR elevation at next appointment. Recommend extension of skilled PT services moving forward.    Rehab Potential Good   Clinical Impairments Affecting Rehab Potential Elevated resting HR with recent history of PE.  Pt has had a death in the family, and will be missing tx on Thursday and Friday.    PT Frequency Other (comment)   PT Duration 4 weeks   PT Treatment/Interventions ADLs/Self Care Home Management;Cryotherapy;Moist Heat;DME Instruction;Gait training;Stair training;Functional mobility training;Therapeutic activities;Therapeutic exercise;Balance training;Neuromuscular re-education;Patient/family education;Manual techniques;Scar mobilization;Passive range of motion;Energy conservation;Taping   PT Next Visit Plan Continue with skilled PT services at 1-2 times per week up to date of next re-assessment. Continue monitoring HR during therapy. Functional strength, functional activity tolernace, balance, functional lifiting    PT Home Exercise Plan none added today.     Consulted and Agree with Plan of Care Patient      Patient will benefit from skilled therapeutic intervention in order to improve the following deficits and impairments:  Abnormal gait, Improper body mechanics, Pain, Decreased coordination, Decreased mobility, Decreased scar mobility, Increased muscle spasms, Postural dysfunction, Decreased activity tolerance, Decreased strength, Decreased balance, Decreased safety awareness, Difficulty walking, Impaired flexibility  Visit Diagnosis: Pain in left hip  Muscle weakness (generalized)  Difficulty in walking, not elsewhere classified  Unsteadiness on feet     Problem List Patient Active Problem List   Diagnosis Date Noted  . Hypersomnia with sleep apnea 04/11/2016  . PE (pulmonary embolism) 03/09/2016  . Leukocytosis 03/09/2016  .  Fever 03/09/2016  . Status post total replacement of left hip 02/01/2016  . Left-sided low back pain with left-sided sciatica 08/06/2015  . Hypothyroidism 08/05/2013  . Fibromyalgia 08/05/2013    Deniece Ree PT, DPT [facility] [address] [place], [place], [address] Phone: [phone]   Fax:  [phone]  Name: Wendy [facility] MRN: [id number] Date of Birth: 10/12/1955

## 2016-04-21 ENCOUNTER — Encounter (HOSPITAL_COMMUNITY): Payer: BLUE CROSS/BLUE SHIELD | Admitting: Physical Therapy

## 2016-04-21 ENCOUNTER — Encounter: Payer: Self-pay | Admitting: Internal Medicine

## 2016-04-21 ENCOUNTER — Other Ambulatory Visit (INDEPENDENT_AMBULATORY_CARE_PROVIDER_SITE_OTHER): Payer: BLUE CROSS/BLUE SHIELD

## 2016-04-21 ENCOUNTER — Ambulatory Visit (INDEPENDENT_AMBULATORY_CARE_PROVIDER_SITE_OTHER): Payer: BLUE CROSS/BLUE SHIELD | Admitting: Internal Medicine

## 2016-04-21 VITALS — BP 126/88 | HR 103 | Ht 64.0 in | Wt 204.0 lb

## 2016-04-21 DIAGNOSIS — R0600 Dyspnea, unspecified: Secondary | ICD-10-CM

## 2016-04-21 DIAGNOSIS — I2699 Other pulmonary embolism without acute cor pulmonale: Secondary | ICD-10-CM

## 2016-04-21 DIAGNOSIS — R079 Chest pain, unspecified: Secondary | ICD-10-CM | POA: Diagnosis not present

## 2016-04-21 LAB — CBC WITH DIFFERENTIAL/PLATELET
Basophils Absolute: 0 10*3/uL (ref 0.0–0.1)
Basophils Relative: 0.5 % (ref 0.0–3.0)
Eosinophils Absolute: 0.2 10*3/uL (ref 0.0–0.7)
Eosinophils Relative: 2.6 % (ref 0.0–5.0)
HCT: 39 % (ref 36.0–46.0)
Hemoglobin: 12.7 g/dL (ref 12.0–15.0)
Lymphocytes Relative: 39.4 % (ref 12.0–46.0)
Lymphs Abs: 2.5 10*3/uL (ref 0.7–4.0)
MCHC: 32.6 g/dL (ref 30.0–36.0)
MCV: 75.1 fl — ABNORMAL LOW (ref 78.0–100.0)
Monocytes Absolute: 0.5 10*3/uL (ref 0.1–1.0)
Monocytes Relative: 7.7 % (ref 3.0–12.0)
Neutro Abs: 3.1 10*3/uL (ref 1.4–7.7)
Neutrophils Relative %: 49.8 % (ref 43.0–77.0)
Platelets: 313 10*3/uL (ref 150.0–400.0)
RBC: 5.19 Mil/uL — ABNORMAL HIGH (ref 3.87–5.11)
RDW: 15.6 % — ABNORMAL HIGH (ref 11.5–15.5)
WBC: 6.3 10*3/uL (ref 4.0–10.5)

## 2016-04-21 LAB — TROPONIN I: TNIDX: 0.01 ug/l (ref 0.00–0.06)

## 2016-04-21 LAB — BASIC METABOLIC PANEL
BUN: 14 mg/dL (ref 6–23)
CO2: 28 mEq/L (ref 19–32)
Calcium: 8.8 mg/dL (ref 8.4–10.5)
Chloride: 105 mEq/L (ref 96–112)
Creatinine, Ser: 1.02 mg/dL (ref 0.40–1.20)
GFR: 59.1 mL/min — ABNORMAL LOW (ref >60.00)
Glucose, Bld: 89 mg/dL (ref 70–99)
Potassium: 4.2 mEq/L (ref 3.5–5.1)
Sodium: 139 mEq/L (ref 135–145)

## 2016-04-21 LAB — BRAIN NATRIURETIC PEPTIDE: Pro B Natriuretic peptide (BNP): 23 pg/mL (ref 0.0–100.0)

## 2016-04-21 LAB — SEDIMENTATION RATE: Sed Rate: 14 mm/hr (ref 0–30)

## 2016-04-21 LAB — TSH: TSH: 0.54 u[IU]/mL (ref 0.35–4.50)

## 2016-04-21 NOTE — Assessment & Plan Note (Addendum)
Venous dopplers 03/10/16 neg bilaterally Echo 03/10/16 no sign PH   No evidence at all to support recurrent pe - could not reproduce doe and she did not desaturate, pain is not pleuritic at all and aggravated by activity / use of shoulder.  I note pt is quite anxious and has h/o fibromyalgia so I strongly suspect this is MSCP not cardiac or pulmonary at all in origin

## 2016-04-21 NOTE — Progress Notes (Signed)
Subjective:     Patient ID: Wendy [facility], female   DOB: 07-25-1956   MRN: [id number]  HPI  76 yowf quit smoking  1997 s/p L Hip surgery 02/01/16  then  acute R post cp then L cp anteriorly and sob > hosp 03/09/16 dx bilateral PE s PH  > 50% improved by discharge and gradually improved then new pain onset 04/18/16 on   L above the breast but not pleuritic but since onset breathing worse with activity - still on Elquis s missing doses.  New pain above L breast is a "steady ache" , worse with bending or walking, some better sitting/ sometime worse lying down assoc with diaphoresis no nausea and no radiation of cp.  Pain has been continuously present since onset  04/18/16 and some worse also if using L shoulder.   No obvious other patterns in day to day or daytime variability or assoc cough or  chest tightness, subjective wheeze or overt sinus or hb symptoms. No unusual exp hx or h/o childhood pna/ asthma or knowledge of premature birth.  Sleeping ok without nocturnal  or early am exacerbation  of respiratory  c/o's or need for noct saba. Also denies any obvious fluctuation of symptoms with weather or environmental changes or other aggravating or alleviating factors except as outlined above   Current Medications, Allergies, Complete Past Medical History, Past Surgical History, Family History, and Social History were reviewed in [employer] record.  ROS  The following are not active complaints unless bolded sore throat, dysphagia, dental problems, itching, sneezing,  nasal congestion or excess/ purulent secretions, ear ache,   fever, chills, sweats, unintended wt loss, classically pleuritic cp, hemoptysis,  orthopnea pnd or leg swelling, presyncope, palpitations, abdominal pain, anorexia, nausea, vomiting, diarrhea  or change in bowel or bladder habits, change in stools or urine, dysuria,hematuria,  rash, arthralgias, visual complaints, headache, numbness, weakness or ataxia or  problems with walking or coordination,  change in mood/affect or memory.         Review of Systems     Objective:   Physical Exam    amb anxious obese wf nad   Wt Readings from Last 3 Encounters:  04/21/16 204 lb (92.5 kg)  04/11/16 203 lb (92.1 kg)  04/06/16 201 lb 1 oz (91.2 kg)    Vital signs reviewed   HEENT: nl dentition, turbinates, and oropharynx. Nl external ear canals without cough reflex   NECK :  without JVD/Nodes/TM/ nl carotid upstrokes bilaterally   LUNGS: no acc muscle use,  Nl contour chest which is clear to A and P bilaterally without cough on insp or exp maneuvers   CV:  RRR  no s3 or murmur or increase in P2, no edema   ABD:  soft and nontender with nl inspiratory excursion in the supine position. No bruits or organomegaly, bowel sounds nl  MS:  Nl gait/ ext warm without deformities, calf tenderness, cyanosis or clubbing No obvious joint restrictions / no tenderness over L ant chest and from of shoulder  SKIN: warm and dry without lesions    NEURO:  alert, approp, nl sensorium with  no motor deficits     ekg at rest and p 3 laps rapidly 04/21/2016 > no evidence ischemia   V/Q   04/11/16 completely wnl   Labs ordered/ reviewed:      Chemistry      Component Value Date/Time   NA 139 04/21/2016 1542   NA 141 03/14/2016 1654  K 4.2 04/21/2016 1542   CL 105 04/21/2016 1542   CO2 28 04/21/2016 1542   BUN 14 04/21/2016 1542   BUN 10 03/14/2016 1654   CREATININE 1.02 04/21/2016 1542      Component Value Date/Time   CALCIUM 8.8 04/21/2016 1542   ALKPHOS 80 03/16/2016 1125   AST 14 (L) 03/16/2016 1125   ALT 14 03/16/2016 1125   BILITOT 0.4 03/16/2016 1125    Troponin I    0.1                                    04/21/2016     Lab Results  Component Value Date   WBC 6.3 04/21/2016   HGB 12.7 04/21/2016   HCT 39.0 04/21/2016   MCV 75.1 (L) 04/21/2016   PLT 313.0 04/21/2016       Lab Results  Component Value Date   TSH 0.54  04/21/2016     Lab Results  Component Value Date   PROBNP 23.0 04/21/2016       Lab Results  Component Value Date   ESRSEDRATE 14 04/21/2016          Assessment:

## 2016-04-21 NOTE — Assessment & Plan Note (Addendum)
Echo 03/10/16 Left ventricle: The cavity size was normal. There was mild   concentric hypertrophy. Systolic function was normal. The   estimated ejection fraction was in the range of 55% to 60%. Wall   motion was normal; there were no regional wall motion   abnormalities. Doppler parameters are consistent with abnormal   left ventricular relaxation (grade 1 diastolic dysfunction).   Doppler parameters are consistent with indeterminate ventricular   filling pressure. - Aortic valve: Transvalvular velocity was within the normal range.   There was no stenosis. There was no regurgitation. - Mitral valve: Transvalvular velocity was within the normal range.   There was no evidence for stenosis. There was no regurgitation. - Left atrium: The atrium was moderately dilated. - Right ventricle: The cavity size was normal. Wall thickness was   normal. Hypokinises of the right ventricular apex. - Pulmonary arteries: Systolic pressure was within the normal   range. PA peak pressure: 33 mm Hg (S). - 04/21/2016  Walked RA x 3 laps @ 185 ft each stopped due to  End of study, no aggravation of CP/ not really sob fast pace and no ischemic changes on ekg immediately p ex and Trop 0.1 with cp reported continuous since 9/18    No clear explanation for sob which we could not reproduce here so to me Symptoms are markedly disproportionate to objective findings and not clear this is a lung problem but pt does appear to have difficult airway management issues.  DDX of  difficult airways management almost all start with A and  include Adherence, Ace Inhibitors, Acid Reflux, Active Sinus Disease, Alpha 1 Antitripsin deficiency, Anxiety masquerading as Airways dz,  ABPA,  Allergy(esp in young), Aspiration (esp in elderly), Adverse effects of meds,  Active smokers, A bunch of PE's (a small clot burden can't cause this syndrome unless there is already severe underlying pulm or vascular dz with poor reserve) plus two Bs  =  Bronchiectasis and Beta blocker use..and one C= CHF   Adherence is always the initial "prime suspect" and is a multilayered concern that requires a "trust but verify" approach in every patient - starting with knowing how to use medications, especially inhalers, correctly, keeping up with refills and understanding the fundamental difference between maintenance and prns vs those medications only taken for a very short course and then stopped and not refilled.  - denies missing any doses of eliquis > rec continue  ? A bunch of PE's > very unlikely on elaquis and given today's ex tol so no need to repeat v/q again or CTa either at this point  ? CHF > diastolic dysfunction suggested by echo showing Grade I but moderate LAE    No change in rx needed for now > continue walking but don't push to exhaustion    I had an extended discussion with the patient reviewing all relevant studies completed to date and  lasting 25 minutes of a 40  minute acute office  visit  Re: ? New PE or new dx to explain cp/sob in setting of recent PE  Each maintenance medication was reviewed in detail including most importantly the difference between maintenance and prns and under what circumstances the prns are to be triggered using an action plan format that is not reflected in the computer generated alphabetically organized AVS.    Please see instructions for details which were reviewed in writing and the patient given a copy highlighting the part that I personally wrote and discussed at today's  ov.

## 2016-04-21 NOTE — Patient Instructions (Signed)
Take is easy until we sort out the cause of your new chest pain and breathing problems but if condition worsens at lower level of activity please go to [facility] ER  Please remember to go to the lab  department downstairs for your tests - we will call you with the results when they are available.

## 2016-04-22 ENCOUNTER — Telehealth: Payer: Self-pay | Admitting: Internal Medicine

## 2016-04-22 ENCOUNTER — Ambulatory Visit: Payer: BLUE CROSS/BLUE SHIELD | Admitting: Internal Medicine

## 2016-04-22 ENCOUNTER — Encounter (HOSPITAL_COMMUNITY): Payer: BLUE CROSS/BLUE SHIELD | Admitting: Physical Therapy

## 2016-04-22 NOTE — Progress Notes (Signed)
Spoke with pt and notified of results per Dr. Wert. Pt verbalized understanding and denied any questions. 

## 2016-04-22 NOTE — Telephone Encounter (Signed)
Spoke with pt, requesting work note to be out of work this upcoming week and to return on 05/02/16.  Pt would like to pick this letter up on Monday.    MW ok to write work note?  Thanks!   Instructions  Take is easy until we sort out the cause of your new chest pain and breathing problems but if condition worsens at lower level of activity please go to [facility] ER   Please remember to go to the lab  department downstairs for your tests - we will call you with the results when they are available

## 2016-04-23 NOTE — Telephone Encounter (Signed)
That's fine

## 2016-04-25 ENCOUNTER — Encounter (HOSPITAL_COMMUNITY): Payer: BLUE CROSS/BLUE SHIELD | Admitting: Physical Therapy

## 2016-04-25 ENCOUNTER — Encounter: Payer: Self-pay | Admitting: *Deleted

## 2016-04-25 ENCOUNTER — Telehealth: Payer: Self-pay | Admitting: Internal Medicine

## 2016-04-25 NOTE — Telephone Encounter (Signed)
Per Magda Paganini, this has already been taken care of. Message will be closed. Nothing further was needed.

## 2016-04-25 NOTE — Telephone Encounter (Signed)
Work note located and handed to patient Nothing further needed; will sign off

## 2016-04-27 ENCOUNTER — Encounter (HOSPITAL_COMMUNITY): Payer: BLUE CROSS/BLUE SHIELD | Admitting: Physical Therapy

## 2016-04-28 DIAGNOSIS — G4733 Obstructive sleep apnea (adult) (pediatric): Secondary | ICD-10-CM | POA: Diagnosis not present

## 2016-04-29 ENCOUNTER — Telehealth (HOSPITAL_COMMUNITY): Payer: Self-pay | Admitting: Physical Therapy

## 2016-04-29 ENCOUNTER — Encounter (HOSPITAL_COMMUNITY): Payer: BLUE CROSS/BLUE SHIELD

## 2016-04-29 DIAGNOSIS — G4733 Obstructive sleep apnea (adult) (pediatric): Secondary | ICD-10-CM | POA: Diagnosis not present

## 2016-04-29 NOTE — Telephone Encounter (Signed)
Patient requested to be D/C due to her insurance has been cx.

## 2016-05-02 ENCOUNTER — Ambulatory Visit (HOSPITAL_COMMUNITY): Payer: BLUE CROSS/BLUE SHIELD

## 2016-05-03 ENCOUNTER — Other Ambulatory Visit: Payer: Self-pay | Admitting: *Deleted

## 2016-05-03 DIAGNOSIS — R0600 Dyspnea, unspecified: Secondary | ICD-10-CM

## 2016-05-03 DIAGNOSIS — R079 Chest pain, unspecified: Secondary | ICD-10-CM

## 2016-05-04 ENCOUNTER — Telehealth: Payer: Self-pay | Admitting: Acute Care

## 2016-05-04 NOTE — Telephone Encounter (Signed)
I called to review Ms. Whiteheads sleep study results with her. There was no answer. I have left a message letting her know I will have the office staff schedule an office visit for her to come in to discuss the results.  Sheena, please have the patient scheduled for an OV with me  to discuss home sleep study results and treatment options. Thanks so much.

## 2016-05-05 NOTE — Telephone Encounter (Signed)
LMTCB

## 2016-05-09 ENCOUNTER — Encounter (HOSPITAL_COMMUNITY): Payer: BLUE CROSS/BLUE SHIELD | Admitting: Physical Therapy

## 2016-05-10 ENCOUNTER — Encounter (HOSPITAL_COMMUNITY): Payer: BLUE CROSS/BLUE SHIELD | Admitting: Physical Therapy

## 2016-05-12 NOTE — Telephone Encounter (Signed)
3rd attempt to contact pt. LMTCB. Letter mailed to home address.  

## 2016-05-13 ENCOUNTER — Telehealth: Payer: Self-pay | Admitting: Pulmonary Disease

## 2016-05-13 NOTE — Telephone Encounter (Signed)
I reviewed my evaluation and don't rec any changes for now in recs I made then - if condition worsens can always be seen in ER s insurance but the cost can be quite high

## 2016-05-13 NOTE — Telephone Encounter (Signed)
Called and spoke with pt and she stated that she has been out of work for 3 months.  She stated that since she went back to work 1 week late, that they cancelled her insurance for the month of October and she will not be able to get this back until November.  She stated that she will call back to schedule an appt with SG to discuss her options in November.     Pt stated that the last time that she saw MW he did an ekg and told her that this was normal.  She stated that she is still having some chest discomfort and wanted to make sure that this was ok.  I advised her that she should follow up with her PCP for this, but the pt stated that since she does not have the insurance, she couldn't.  MW please advise. thanks

## 2016-05-13 NOTE — Telephone Encounter (Signed)
lmtcb

## 2016-05-13 NOTE — Telephone Encounter (Signed)
Pt will not be reachable until after 4 today

## 2016-05-16 ENCOUNTER — Encounter (HOSPITAL_COMMUNITY): Payer: BLUE CROSS/BLUE SHIELD | Admitting: Physical Therapy

## 2016-05-16 ENCOUNTER — Telehealth: Payer: Self-pay | Admitting: Family Medicine

## 2016-05-16 NOTE — Telephone Encounter (Signed)
PE and was out of work. Insurance was suspended Until November 1st. Elequis $400/mo without insurance and was wondering if there are any samples she can have until then. If not, she would like to know if she can have a different, less expensive medication. Cannot afford Elequis until insurance is reinstated next month.

## 2016-05-16 NOTE — Telephone Encounter (Signed)
Tell the patient that I will call cardiology to see if they happen have any samples to last her until November 1 I don't guarantee this but it's at least worth a try aspirin will not adequately cover this. 2 weeks worth of medicine would more than likely be half the cost of a current prescription

## 2016-05-16 NOTE — Telephone Encounter (Signed)
Spoke with patient and informed her per Dr.Scott Luking- Unfortunately this is not simple at all. Coumadin can be used as a cheaper alternative but you cannot simply stop Eliquis and start Coumadin. It would require for her to be on Lovenox shots as well until the Coumadin takes effect. This would be very expensive. It would be better for her to call her pulmonologist and see if they happen to have any samples of Eliquis. Or another option would be for her to pay for the Eliquis out-of-pocket from now till November 1. Patient verbalized understanding and stated that unfortunately she can't afford the Eliquis it is currently $400. She asked if she could just take aspirin or if there was any other options besides the two mentioned above

## 2016-05-16 NOTE — Telephone Encounter (Signed)
Unfortunately this is not simple at all. Coumadin can be used as a cheaper alternative but you cannot simply stop Eliquis and start Coumadin. It would require for her to be on Lovenox shots as well until the Coumadin takes effect. This would be very expensive. It would be better for her to call her pulmonologist and see if they happen to have any samples of Eliquis. Or another option would be for her to pay for the Eliquis out-of-pocket from now till November 1.

## 2016-05-16 NOTE — Telephone Encounter (Signed)
Left message return call 05/16/16

## 2016-05-16 NOTE — Telephone Encounter (Signed)
Spoke with patient and informed her per Dr.Scott Luking-Tell the patient that I will call cardiology to see if they happen have any samples to last her until November 1 I don't guarantee this but it's at least worth a try aspirin will not adequately cover this. 2 weeks worth of medicine would more than likely be half the cost of a current prescription. Patient verbalized understanding.

## 2016-05-17 ENCOUNTER — Encounter (HOSPITAL_COMMUNITY): Payer: BLUE CROSS/BLUE SHIELD | Admitting: Physical Therapy

## 2016-05-17 ENCOUNTER — Telehealth: Payer: Self-pay | Admitting: Family Medicine

## 2016-05-17 ENCOUNTER — Other Ambulatory Visit: Payer: Self-pay | Admitting: *Deleted

## 2016-05-17 MED ORDER — APIXABAN 5 MG PO TABS
5.0000 mg | ORAL_TABLET | Freq: Two times a day (BID) | ORAL | 3 refills | Status: DC
Start: 2016-05-17 — End: 2017-02-17

## 2016-05-17 NOTE — Telephone Encounter (Signed)
Called and spoke with pt and she is aware of MW recs.  She stated that she will not be able to get her insurance until the first of the month and she is having a really hard time getting her medications.  Requested samples of eliquis (this was started by the ER) and I advised her that we did not have any samples of the eliquis, but we did have 30 day free trial offers and I have left this up front for her to pick up.

## 2016-05-17 NOTE — Telephone Encounter (Signed)
Please find out from the patient if she needs 30 day supply? That would be one twice a day, 5 mg, #60 with 3 refills. Or-she need 14 day supply? Patient was having a hard time getting the medication. Next question will she be able to afford it with the card she got or that she needs samples from cardiology-that is if we can get them

## 2016-05-17 NOTE — Telephone Encounter (Signed)
Pt is needing a new prescription for apixaban (ELIQUIS) 5 MG TABS tablet Sent to the pharmacy in order for her to be able to use the discount card that she has.    [place]

## 2016-05-17 NOTE — Telephone Encounter (Signed)
Patient wanted to let Dr. Nicki Reaper know, thank you, she did get her medication.

## 2016-05-17 NOTE — Telephone Encounter (Signed)
Pt wants 30 day supply. She will try and use the card again at [facility]. Pt states she does not know if it will work. A 30 day supply is $400. If she cannot use card she will call us back. 30 day supply sent to pharm.

## 2016-05-17 NOTE — Telephone Encounter (Signed)
Patient was able to go ahead and get her medication for 1 month

## 2016-05-24 ENCOUNTER — Encounter (HOSPITAL_COMMUNITY): Payer: BLUE CROSS/BLUE SHIELD

## 2016-05-26 ENCOUNTER — Encounter (HOSPITAL_COMMUNITY): Payer: BLUE CROSS/BLUE SHIELD

## 2016-06-01 ENCOUNTER — Encounter (HOSPITAL_COMMUNITY): Payer: BLUE CROSS/BLUE SHIELD | Admitting: Physical Therapy

## 2016-06-02 ENCOUNTER — Encounter (HOSPITAL_COMMUNITY): Payer: BLUE CROSS/BLUE SHIELD | Admitting: Physical Therapy

## 2016-06-10 ENCOUNTER — Other Ambulatory Visit: Payer: Self-pay | Admitting: Family Medicine

## 2016-09-16 ENCOUNTER — Other Ambulatory Visit: Payer: Self-pay | Admitting: Family Medicine

## 2016-10-20 ENCOUNTER — Ambulatory Visit (INDEPENDENT_AMBULATORY_CARE_PROVIDER_SITE_OTHER)
Admission: RE | Admit: 2016-10-20 | Discharge: 2016-10-20 | Disposition: A | Discharge status: Home / Self Care | Payer: BLUE CROSS/BLUE SHIELD | Source: Ambulatory Visit | Attending: Acute Care | Admitting: Acute Care

## 2016-10-20 ENCOUNTER — Encounter: Payer: Self-pay | Admitting: Acute Care

## 2016-10-20 ENCOUNTER — Ambulatory Visit (INDEPENDENT_AMBULATORY_CARE_PROVIDER_SITE_OTHER): Payer: BLUE CROSS/BLUE SHIELD | Admitting: Acute Care

## 2016-10-20 VITALS — BP 106/72 | HR 87 | Ht 64.0 in | Wt 205.4 lb

## 2016-10-20 DIAGNOSIS — G4733 Obstructive sleep apnea (adult) (pediatric): Secondary | ICD-10-CM | POA: Diagnosis not present

## 2016-10-20 DIAGNOSIS — R0600 Dyspnea, unspecified: Secondary | ICD-10-CM | POA: Diagnosis not present

## 2016-10-20 DIAGNOSIS — R079 Chest pain, unspecified: Secondary | ICD-10-CM

## 2016-10-20 DIAGNOSIS — Z9989 Dependence on other enabling machines and devices: Secondary | ICD-10-CM

## 2016-10-20 DIAGNOSIS — I2699 Other pulmonary embolism without acute cor pulmonale: Secondary | ICD-10-CM | POA: Diagnosis not present

## 2016-10-20 NOTE — Assessment & Plan Note (Signed)
Initiation of CPAP for moderate OSA. Plan to start at [facility] 5-15 cm water Plan: We will order a CPAP device. Follow up 1 month after you get the CPAP machine with download Start on CPAP at bedtime.  Goal is to wear for at least 4-6 hours each night for maximal clinical benefit. Continue to work on weight loss, as the link between excess weight  and sleep apnea is well established.  Do not drive if sleepy.

## 2016-10-20 NOTE — Assessment & Plan Note (Addendum)
Complaint of generalized , non-specific chest pain that is intermittent and self resolves. No chest pain today Grade 1 Diastolic dysfunction per Echo 03/2016  Plan: Cardiology referral

## 2016-10-20 NOTE — Progress Notes (Addendum)
History of Present Illness Wendy Marshall is a 61 y.o. female former smoker ( Quit 1997, less than a pack per day) with history of provoked PE ( Post hip surgery), and moderate OSA. She is followed by Dr. Halford Chessman.   10/20/2016 Follow Up for OSA Pt. Presents today for follow up of sleep study completed 04/28/2016. She never came into the office for follow up as she lost her insurance coverage for awhile, and was not able to start CPAP. Sleep Study indicated moderate sleep apnea. Options for treatment include weight loss, CPAP, oral appliance, or surgical assessment. Patient currently prefers to initiate CPAP treatment. She does give history of daytime sleepiness, she does work full-time at the [employer]. The work she performs there is at times exertional. Patient had a provoked PE in August 2017 post hip surgery. Per echo there was no heart strain associated with this PE. She is compliant with her Eliquis Daily. We did discuss duration of therapy today. We will consider stopping Eliquis  at the 9 month mark. Patient continues to complain of some generalized dyspnea with exertion. He has noted about a 5 pound weight gain.. Prior to pulmonary embolism there was no notation of pulmonary history. Additionally patient complains of intermittent anginal type pains that self resolves. She is currently not having any pain. She denies fever, orthopnea, or hemoptysis.  Tests Chest x-ray 10/20/2016>>  IMPRESSION: No active cardiopulmonary disease.  Past medical hx Past Medical History:  Diagnosis Date  . Arthritis   . Bilateral conjunctivitis    seen by PCP on 01/06/16, patient states Dr Alvan Dame aware   . Complication of anesthesia    slow to wake up with tonsils and tubal ligation   . Fibromyalgia   . GERD (gastroesophageal reflux disease)    occasional   . Hypothyroidism   . PONV (postoperative nausea and vomiting)      Past surgical hx, Family hx, Social hx all reviewed.  Current  Outpatient Prescriptions on File Prior to Visit  Medication Sig  . apixaban (ELIQUIS) 5 MG TABS tablet Take 1 tablet (5 mg total) by mouth 2 (two) times daily.  Marland Kitchen levothyroxine (SYNTHROID, LEVOTHROID) 100 MCG tablet TAKE ONE TABLET BY MOUTH ONCE DAILY  . Multiple Vitamin (MULTIVITAMIN WITH MINERALS) TABS tablet Take 1 tablet by mouth daily.  Marland Kitchen HYDROcodone-acetaminophen (NORCO) 7.5-325 MG tablet Take 1 tablet by mouth every 4 (four) hours as needed for pain.   No current facility-administered medications on file prior to visit.      Allergies  Allergen Reactions  . Penicillins Hives, Shortness Of Breath and Other (See Comments)    Childhood reaction was told her reaction may have been shortness of breath and hives Has patient had a PCN reaction causing immediate rash, facial/tongue/throat swelling, SOB or lightheadedness with hypotension: yes Has patient had a PCN reaction causing severe rash involving mucus membranes or skin necrosis: no Has patient had a PCN reaction that required hospitalization no Has patient had a PCN reaction occurring within the last 10 years: no If all of the above answers are "NO", then may proceed   . Contrast Media [Iodinated Diagnostic Agents]     hives  . Flexeril [Cyclobenzaprine] Other (See Comments)    Heart problems - was told she should probably avoid all muscle relaxers   . Omnipaque [Iohexol] Hives and Itching    RN to give pt benadryl  . Soma [Carisoprodol] Other (See Comments)    Heart problems - was told  she should probably avoid all muscle relaxers    Review Of Systems:  Constitutional:   No  weight loss, night sweats,  Fevers, chills, fatigue, or  lassitude.  HEENT:   No headaches,  Difficulty swallowing,  Tooth/dental problems, or  Sore throat,                No sneezing, itching, ear ache, nasal congestion, post nasal drip,   CV:  Occasional generalized chest pain that self resolves,   No Orthopnea, PND, swelling in lower extremities,  anasarca, dizziness, palpitations, syncope.   GI  No heartburn, indigestion, abdominal pain, nausea, vomiting, diarrhea, change in bowel habits, loss of appetite, bloody stools.   Resp: Positive shortness of breath with exertion not  at rest.  No excess mucus, no productive cough,  No non-productive cough,  No coughing up of blood.  No change in color of mucus.  No wheezing.  No chest wall deformity  Skin: no rash or lesions.  GU: no dysuria, change in color of urine, no urgency or frequency.  No flank pain, no hematuria   MS:  No joint pain or swelling.  No decreased range of motion.  No back pain.  Psych:  No change in mood or affect. No depression or anxiety.  No memory loss.   Vital Signs BP 106/72 (BP Location: Left Arm, Patient Position: Sitting, Cuff Size: Normal)   Pulse 87   Ht 5\' 4"  (1.626 m)   Wt 205 lb 6.4 oz (93.2 kg)   SpO2 99%   BMI 35.26 kg/m    Physical Exam:  General- No distress,  A&Ox3, pleasant ENT: No sinus tenderness, TM clear, pale nasal mucosa, no oral exudate,no post nasal drip, no LAN Cardiac: S1, S2, regular rate and rhythm, no murmur Chest: No wheeze/ rales/ dullness; no accessory muscle use, no nasal flaring, no sternal retractions Abd.: Soft Non-tender, obese Ext: No clubbing cyanosis, edema Neuro:  normal strength Skin: No rashes, warm and dry Psych: normal mood and behavior   Assessment/Plan  OSA on CPAP Initiation of CPAP for moderate OSA. Plan to start at [facility] 5-15 cm water Plan: We will order a CPAP device. Follow up 1 month after you get the CPAP machine with download Start on CPAP at bedtime.  Goal is to wear for at least 4-6 hours each night for maximal clinical benefit. Continue to work on weight loss, as the link between excess weight  and sleep apnea is well established.  Do not drive if sleepy.   Pulmonary embolism (HCC) Continue Eliquis daily  Consider discontinuing after 9-12 months ( June-Sept.) Pt. Has adequate  refills to ensure no lapse in therapy.  Dyspnea Continued complaints of dyspnea with exertion Obese Plan: We will do a CXR today We will schedule PFT's today. Follow up in 1 month  Nonspecific chest pain Complaint of generalized , non-specific chest pain that is intermittent and self resolves. No chest pain today Grade 1 Diastolic dysfunction per Echo 03/2016  Plan: Cardiology referral    Magdalen Spatz, NP 10/20/2016  2:10 PM

## 2016-10-20 NOTE — Assessment & Plan Note (Signed)
Continued complaints of dyspnea with exertion Obese Plan: We will do a CXR today We will schedule PFT's today. Follow up in 1 month

## 2016-10-20 NOTE — Assessment & Plan Note (Addendum)
VSS, no tachycardia Continue Eliquis daily  Consider discontinuing after 9-12 months ( June-Sept.) Pt. Has adequate refills to ensure no lapse in therapy.

## 2016-10-20 NOTE — Patient Instructions (Addendum)
It is good to see you today. We will do a CXR today We will schedule PFT's today. We will refer you for a cardiology consultDomenic Marshall if possible) We will order a CPAP device. Follow up 1 month after you get the CPAP machine  Start on CPAP at bedtime.  Goal is to wear for at least 4-6 hours each night for maximal clinical benefit. Continue to work on weight loss, as the link between excess weight  and sleep apnea is well established.  Do not drive if sleepy. Follow up  In  One month  or before as needed with down load.. Discuss coming off Eliquis at 9 months.

## 2016-10-21 NOTE — Progress Notes (Signed)
I have reviewed and agree with assessment/plan.  Chesley Mires, MD [facility] Pulmonary/Critical Care 10/21/2016, 7:20 AM Pager:  [phone]

## 2016-11-16 ENCOUNTER — Ambulatory Visit: Payer: BLUE CROSS/BLUE SHIELD | Admitting: Cardiology

## 2016-12-06 ENCOUNTER — Encounter: Payer: Self-pay | Admitting: Cardiology

## 2016-12-06 NOTE — Progress Notes (Signed)
Cardiology Office Note  Date: 12/07/2016   ID: Wendy Marshall, DOB [birth year], MRN [id number]  PCP: [name], MD  Consulting Cardiologist: Rozann Lesches, MD   Chief Complaint  Patient presents with  . Shortness of Breath  . Chest Pain    History of Present Illness: Wendy Marshall is a 61 y.o. female referred for cardiology consultation by Ms. Elie Confer NP in the Pulmonary clinic for evaluation of chest pain. I reviewed her history and updated the chart. She has a history of bilateral pulmonary emboli in August of last year, occurred following hip surgery. She has been anticoagulated since that time. She states that overall the breathlessness she was experiencing at that time has improved significantly, although not back to baseline. She still gets short of breath when she tries to walk any significant distance, and sometimes feels a pain in her chest when she takes a deep breath or exerts herself. At those time she also feels a sense of palpitations. She does not report any cough or hemoptysis, does not have breathlessness at rest. She is also not reporting any significant leg edema. No syncope.  From a cardiac perspective she has no history of CAD or cardiomyopathy. Echocardiogram done around the time of her pulmonary embolus is outlined below. She did have some right ventricular dysfunction, but PASP was only 33 mmHg. Chest x-ray done in March reported no acute process.  Personal cardiac risk factors include prior history of tobacco use, some heart disease in the family. Otherwise she does not have diabetes mellitus, hyperlipidemia, or hypertension. She has not undergone any formal ischemic testing. Baseline ECG is abnormal but nonspecific overall as described below.  Past Medical History:  Diagnosis Date  . Arthritis   . Bilateral conjunctivitis   . Fibromyalgia   . GERD (gastroesophageal reflux disease)   . History of pulmonary embolus (PE)    August 2017 after hip  surgery  . Hypothyroidism   . OSA (obstructive sleep apnea)     Past Surgical History:  Procedure Laterality Date  . CARPAL TUNNEL RELEASE    . EYE SURGERY    . MANDIBLE SURGERY    . ROOT CANAL    . SHOULDER SURGERY    . SPINAL FUSION  2003  . TONSILLECTOMY    . TOTAL HIP ARTHROPLASTY Left 02/01/2016   Procedure: LEFT TOTAL HIP ARTHROPLASTY ANTERIOR APPROACH;  Surgeon: Paralee Cancel, MD;  Location: [facility];  Service: Orthopedics;  Laterality: Left;  Failed Spinal to LMA  . TUBAL LIGATION      Current Outpatient Prescriptions  Medication Sig Dispense Refill  . apixaban (ELIQUIS) 5 MG TABS tablet Take 1 tablet (5 mg total) by mouth 2 (two) times daily. 60 tablet 3  . levothyroxine (SYNTHROID, LEVOTHROID) 100 MCG tablet TAKE ONE TABLET BY MOUTH ONCE DAILY 90 tablet 0  . Multiple Vitamin (MULTIVITAMIN WITH MINERALS) TABS tablet Take 1 tablet by mouth daily.     No current facility-administered medications for this visit.    Allergies:  Penicillins; Contrast media [iodinated diagnostic agents]; Flexeril [cyclobenzaprine]; Omnipaque [iohexol]; and Soma [carisoprodol]   Social History: The patient  reports that she quit smoking about 20 years ago. Her smoking use included Cigarettes. She has a 5.50 pack-year smoking history. She has never used smokeless tobacco. She reports that she drinks alcohol. She reports that she does not use drugs.   Family History: The patient's family history includes Brain cancer in her father; Colon cancer in her other;  Heart attack in her other; Hypertension in her mother; Throat cancer in her other.   ROS:  Please see the history of present illness. Otherwise, complete review of systems is positive for exertional dyspnea, reportedly NYHA class 2-3.  All other systems are reviewed and negative.   Physical Exam: VS:  BP 122/82 (BP Location: Left Arm)   Pulse 89   Ht 5\' 4"  (1.626 m)   Wt 202 lb (91.6 kg)   SpO2 96%   BMI 34.67 kg/m , BMI Body mass index is  34.67 kg/m.  Wt Readings from Last 3 Encounters:  12/07/16 202 lb (91.6 kg)  10/20/16 205 lb 6.4 oz (93.2 kg)  04/21/16 204 lb (92.5 kg)    General: Overweight woman, appears comfortable at rest. HEENT: Conjunctiva and lids normal, oropharynx clear. Neck: Supple, no elevated JVP or carotid bruits, no thyromegaly. Lungs: Clear to auscultation, nonlabored breathing at rest. Cardiac: Regular rate and rhythm, no S3 or significant systolic murmur, no pericardial rub. Abdomen: Soft, nontender, bowel sounds present, no guarding or rebound. Extremities: Trace ankle edema, distal pulses 2+. Skin: Warm and dry. Musculoskeletal: No kyphosis. Neuropsychiatric: Alert and oriented x3, affect grossly appropriate.  ECG: I personally reviewed the tracing from 04/21/2016 which showed sinus rhythm with poor R-wave progression and nonspecific T-wave changes.  Recent Labwork: 03/16/2016: ALT 14; AST 14; B Natriuretic Peptide 11.6 04/21/2016: BUN 14; Creatinine, Ser 1.02; Hemoglobin 12.7; Platelets 313.0; Potassium 4.2; Pro B Natriuretic peptide (BNP) 23.0; Sodium 139; TSH 0.54     Component Value Date/Time   CHOL 232 (H) 02/23/2015 0806   TRIG 137 02/23/2015 0806   HDL 60 02/23/2015 0806   CHOLHDL 3.9 02/23/2015 0806   CHOLHDL 4.3 12/27/2013 0732   VLDL 27 12/27/2013 0732   LDLCALC 145 (H) 02/23/2015 0806    Other Studies Reviewed Today:  Echocardiogram 03/10/2016: Study Conclusions  - Left ventricle: The cavity size was normal. There was mild   concentric hypertrophy. Systolic function was normal. The   estimated ejection fraction was in the range of 55% to 60%. Wall   motion was normal; there were no regional wall motion   abnormalities. Doppler parameters are consistent with abnormal   left ventricular relaxation (grade 1 diastolic dysfunction).   Doppler parameters are consistent with indeterminate ventricular   filling pressure. - Aortic valve: Transvalvular velocity was within the  normal range.   There was no stenosis. There was no regurgitation. - Mitral valve: Transvalvular velocity was within the normal range.   There was no evidence for stenosis. There was no regurgitation. - Left atrium: The atrium was moderately dilated. - Right ventricle: The cavity size was normal. Wall thickness was   normal. Hypokinises of the right ventricular apex. - Pulmonary arteries: Systolic pressure was within the normal   range. PA peak pressure: 33 mm Hg (S).  Chest x-ray 10/20/2016: FINDINGS: Cardiomediastinal silhouette is stable. No infiltrate or pleural effusion. No pulmonary edema. Again noted metallic fixation plate cervical spine.  IMPRESSION: No active cardiopulmonary disease.  Assessment and Plan:  1. Exertional dyspnea as well as chest discomfort, typical and atypical features described from the perspective of ischemic heart disease. She may still be having some residual symptoms secondary to her pulmonary embolus, although has been consistent with anticoagulation for over 6 months at this point. We will obtain an echocardiogram to assess cardiac structure and function and reevaluate pulmonary artery pressures. We will also obtain an exercise Myoview to screen for ischemic heart disease. It would  be useful to check oxygen saturation while she is exercising on the treadmill.  2. History of bilateral pulmonary emboli following hip surgery in August 2017. She continues on Eliquis and follows with the Pulmonary division.  3. Tobacco abuse in remission.  4. History of hypothyroidism, on Synthroid. She follows with Dr. Wolfgang Phoenix. TSH in September 2017 was normal range.  Current medicines were reviewed with the patient today.   Orders Placed This Encounter  Procedures  . NM Myocar Multi W/Spect W/Wall Motion / EF  . ECHOCARDIOGRAM COMPLETE    Disposition: Call with test results.  Signed, Satira Sark, MD, [facility] 12/07/2016 8:53 AM    [facility]. [address], [place], [place] [address] Phone: [phone]; Fax: [phone]

## 2016-12-07 ENCOUNTER — Encounter: Payer: Self-pay | Admitting: Cardiology

## 2016-12-07 ENCOUNTER — Ambulatory Visit: Payer: BLUE CROSS/BLUE SHIELD | Admitting: Pulmonary Disease

## 2016-12-07 ENCOUNTER — Ambulatory Visit (HOSPITAL_COMMUNITY)
Admission: RE | Admit: 2016-12-07 | Discharge: 2016-12-07 | Disposition: A | Discharge status: Home / Self Care | Payer: BLUE CROSS/BLUE SHIELD | Source: Ambulatory Visit | Attending: Acute Care | Admitting: Acute Care

## 2016-12-07 ENCOUNTER — Ambulatory Visit (INDEPENDENT_AMBULATORY_CARE_PROVIDER_SITE_OTHER): Payer: BLUE CROSS/BLUE SHIELD | Admitting: Cardiology

## 2016-12-07 VITALS — BP 122/82 | HR 89 | Ht 64.0 in | Wt 202.0 lb

## 2016-12-07 DIAGNOSIS — R0600 Dyspnea, unspecified: Secondary | ICD-10-CM

## 2016-12-07 DIAGNOSIS — R0602 Shortness of breath: Secondary | ICD-10-CM

## 2016-12-07 DIAGNOSIS — R0609 Other forms of dyspnea: Secondary | ICD-10-CM | POA: Diagnosis not present

## 2016-12-07 DIAGNOSIS — R079 Chest pain, unspecified: Secondary | ICD-10-CM

## 2016-12-07 DIAGNOSIS — F17201 Nicotine dependence, unspecified, in remission: Secondary | ICD-10-CM

## 2016-12-07 DIAGNOSIS — Z86711 Personal history of pulmonary embolism: Secondary | ICD-10-CM | POA: Diagnosis not present

## 2016-12-07 LAB — PULMONARY FUNCTION TEST
DL/VA % pred: 100 %
DL/VA: 4.81 ml/min/mmHg/L
DLCO unc % pred: 85 %
DLCO unc: 20.83 ml/min/mmHg
FEF 25-75 Post: 2.79 L/sec
FEF 25-75 Pre: 2.55 L/sec
FEF2575-%Change-Post: 9 %
FEF2575-%Pred-Post: 116 %
FEF2575-%Pred-Pre: 106 %
FEV1-%Change-Post: 1 %
FEV1-%Pred-Post: 98 %
FEV1-%Pred-Pre: 97 %
FEV1-Post: 2.54 L
FEV1-Pre: 2.52 L
FEV1FVC-%Change-Post: 3 %
FEV1FVC-%Pred-Pre: 102 %
FEV6-%Change-Post: -2 %
FEV6-%Pred-Post: 94 %
FEV6-%Pred-Pre: 96 %
FEV6-Post: 3.06 L
FEV6-Pre: 3.12 L
FEV6FVC-%Change-Post: 0 %
FEV6FVC-%Pred-Post: 104 %
FEV6FVC-%Pred-Pre: 102 %
FVC-%Change-Post: -2 %
FVC-%Pred-Post: 91 %
FVC-%Pred-Pre: 94 %
FVC-Post: 3.07 L
FVC-Pre: 3.16 L
Post FEV1/FVC ratio: 83 %
Post FEV6/FVC ratio: 100 %
Pre FEV1/FVC ratio: 80 %
Pre FEV6/FVC Ratio: 99 %
RV % pred: 73 %
RV: 1.44 L
TLC % pred: 90 %
TLC: 4.57 L

## 2016-12-07 MED ORDER — ALBUTEROL SULFATE (2.5 MG/3ML) 0.083% IN NEBU
2.5000 mg | INHALATION_SOLUTION | Freq: Once | RESPIRATORY_TRACT | Status: AC
  Administered 2016-12-07: 2.5 mg via RESPIRATORY_TRACT

## 2016-12-07 NOTE — Patient Instructions (Signed)
Medication Instructions:  Your physician recommends that you continue on your current medications as directed. Please refer to the Current Medication list given to you today.   Labwork: NONE  Testing/Procedures: Your physician has requested that you have an echocardiogram. Echocardiography is a painless test that uses sound waves to create images of your heart. It provides your doctor with information about the size and shape of your heart and how well your heart's chambers and valves are working. This procedure takes approximately one hour. There are no restrictions for this procedure.   Your physician has requested that you have en exercise stress myoview. For further information please visit [email or web address]. Please follow instruction sheet, as given.    Follow-Up: Your physician recommends that you schedule a follow-up appointment in: TO BE DETERMINED BASED ON TEST    Any Other Special Instructions Will Be Listed Below (If Applicable).     If you need a refill on your cardiac medications before your next appointment, please call your pharmacy.

## 2016-12-08 NOTE — Progress Notes (Signed)
LMOMTCB x 1 

## 2016-12-14 ENCOUNTER — Telehealth: Payer: Self-pay | Admitting: *Deleted

## 2016-12-14 ENCOUNTER — Encounter (HOSPITAL_BASED_OUTPATIENT_CLINIC_OR_DEPARTMENT_OTHER)
Admission: RE | Admit: 2016-12-14 | Discharge: 2016-12-14 | Disposition: A | Discharge status: Home / Self Care | Payer: BLUE CROSS/BLUE SHIELD | Source: Ambulatory Visit | Attending: Cardiology | Admitting: Cardiology

## 2016-12-14 ENCOUNTER — Encounter (HOSPITAL_COMMUNITY): Payer: Self-pay

## 2016-12-14 ENCOUNTER — Ambulatory Visit (HOSPITAL_COMMUNITY)
Admission: RE | Admit: 2016-12-14 | Discharge: 2016-12-14 | Disposition: A | Discharge status: Home / Self Care | Payer: BLUE CROSS/BLUE SHIELD | Source: Ambulatory Visit | Attending: Cardiology | Admitting: Cardiology

## 2016-12-14 ENCOUNTER — Encounter (HOSPITAL_COMMUNITY)
Admission: RE | Admit: 2016-12-14 | Discharge: 2016-12-14 | Disposition: A | Discharge status: Home / Self Care | Payer: BLUE CROSS/BLUE SHIELD | Source: Ambulatory Visit | Attending: Cardiology | Admitting: Cardiology

## 2016-12-14 DIAGNOSIS — R0602 Shortness of breath: Secondary | ICD-10-CM | POA: Diagnosis not present

## 2016-12-14 DIAGNOSIS — R0609 Other forms of dyspnea: Secondary | ICD-10-CM | POA: Diagnosis not present

## 2016-12-14 DIAGNOSIS — Z86711 Personal history of pulmonary embolism: Secondary | ICD-10-CM | POA: Diagnosis not present

## 2016-12-14 DIAGNOSIS — R079 Chest pain, unspecified: Secondary | ICD-10-CM

## 2016-12-14 DIAGNOSIS — R0600 Dyspnea, unspecified: Secondary | ICD-10-CM

## 2016-12-14 DIAGNOSIS — I081 Rheumatic disorders of both mitral and tricuspid valves: Secondary | ICD-10-CM | POA: Diagnosis not present

## 2016-12-14 LAB — NM MYOCAR MULTI W/SPECT W/WALL MOTION / EF
Estimated workload: 10.1 METS
Exercise duration (min): 8 min
Exercise duration (sec): 6 s
LV dias vol: 72 mL (ref 46–106)
LV sys vol: 18 mL
MPHR: 162 {beats}/min
Peak HR: 146 {beats}/min
Percent HR: 90 %
RATE: 0.64
RPE: 17
Rest HR: 76 {beats}/min
SDS: 0
SRS: 0
SSS: 0
TID: 1.09

## 2016-12-14 MED ORDER — TECHNETIUM TC 99M TETROFOSMIN IV KIT
30.0000 | PACK | Freq: Once | INTRAVENOUS | Status: AC | PRN
  Administered 2016-12-14: 30 via INTRAVENOUS

## 2016-12-14 MED ORDER — REGADENOSON 0.4 MG/5ML IV SOLN
INTRAVENOUS | Status: AC
  Filled 2016-12-14: qty 5

## 2016-12-14 MED ORDER — TECHNETIUM TC 99M TETROFOSMIN IV KIT
10.0000 | PACK | Freq: Once | INTRAVENOUS | Status: AC | PRN
  Administered 2016-12-14: 10 via INTRAVENOUS

## 2016-12-14 MED ORDER — SODIUM CHLORIDE 0.9% FLUSH
INTRAVENOUS | Status: AC
  Filled 2016-12-14: qty 180

## 2016-12-14 MED ORDER — SODIUM CHLORIDE 0.9% FLUSH
INTRAVENOUS | Status: AC
Start: 2016-12-14 — End: 2016-12-14
  Administered 2016-12-14: 10 mL via INTRAVENOUS
  Filled 2016-12-14: qty 10

## 2016-12-14 NOTE — Telephone Encounter (Signed)
Patient notified and voiced understanding.

## 2016-12-14 NOTE — Progress Notes (Signed)
*  PRELIMINARY RESULTS* Echocardiogram 2D Echocardiogram has been performed.  Wendy Marshall 12/14/2016, 9:19 AM

## 2016-12-14 NOTE — Telephone Encounter (Signed)
-----   Message from Satira Sark, MD sent at 12/14/2016  4:41 PM EDT ----- Regarding: RE: Eliquis  Duration of treatment for her pulmonary emboli is being determined by the Pulmonary division. She should be able to find out from her pulmonologist.  ----- Message ----- From: Levonne Hubert, LPN Sent: 6/43/3295   4:33 PM To: Satira Sark, MD Subject: Eliquis                                        Patient would like to know if she may stop taking Eliquis ?

## 2016-12-30 ENCOUNTER — Telehealth: Payer: Self-pay | Admitting: Family Medicine

## 2016-12-30 DIAGNOSIS — E038 Other specified hypothyroidism: Secondary | ICD-10-CM

## 2016-12-30 DIAGNOSIS — R5383 Other fatigue: Secondary | ICD-10-CM

## 2016-12-30 DIAGNOSIS — Z13220 Encounter for screening for lipoid disorders: Secondary | ICD-10-CM

## 2016-12-30 NOTE — Telephone Encounter (Signed)
Requesting order for labwork.

## 2017-01-02 ENCOUNTER — Encounter: Payer: Self-pay | Admitting: Acute Care

## 2017-01-02 ENCOUNTER — Ambulatory Visit (INDEPENDENT_AMBULATORY_CARE_PROVIDER_SITE_OTHER): Payer: BLUE CROSS/BLUE SHIELD | Admitting: Acute Care

## 2017-01-02 DIAGNOSIS — G4733 Obstructive sleep apnea (adult) (pediatric): Secondary | ICD-10-CM

## 2017-01-02 DIAGNOSIS — Z9989 Dependence on other enabling machines and devices: Secondary | ICD-10-CM | POA: Diagnosis not present

## 2017-01-02 DIAGNOSIS — R0600 Dyspnea, unspecified: Secondary | ICD-10-CM | POA: Diagnosis not present

## 2017-01-02 DIAGNOSIS — I2699 Other pulmonary embolism without acute cor pulmonale: Secondary | ICD-10-CM | POA: Diagnosis not present

## 2017-01-02 NOTE — Telephone Encounter (Signed)
From what I can see in the notes she needs routine yearly labs including: lipid, met 7, liver, TSH and vitamin D level. These are due in August and her next appointment is in September. Not sure why we are ordering now.

## 2017-01-02 NOTE — Progress Notes (Signed)
I have reviewed and agree with assessment/plan.  Chesley Mires, MD [facility] Pulmonary/Critical Care 01/02/2017, 3:32 PM Pager:  [phone]

## 2017-01-02 NOTE — Telephone Encounter (Signed)
I do not see a follow up appointment with [place]. Also is she getting her wellness exams?

## 2017-01-02 NOTE — Assessment & Plan Note (Signed)
Patient saw hematology. They feel the blood clot came from her hip replacement surgery. They recommended Eliquis for at least 6 months. They stated due to the extent of her pulmonary embolism it would be reasonable to do anticoagulation up to 9-12 months. Another alternative would be switching to prophylactic aspirin after 6 months of Eliquis. Plan: Continue Eliquis daily for now. Follow up with Dr. Halford Chessman in 3 months I will check with Dr. Halford Chessman for preference of 9 vs 12 months treatment.

## 2017-01-02 NOTE — Progress Notes (Signed)
History of Present Illness Wendy Marshall is a 61 y.o. female  former smoker ( Quit 1997, less than a pack per day) with history of provoked PE 03/2016 ( Post hip surgery), and moderate OSA. She is followed by Dr. Halford Chessman.   01/02/2017 CPAP Follow Up: Pt. Presents for CPAP follow up. She states she is doing well on her CPAP. She is compliant daily. She states she has very little daytime sleepiness.She states her headaches upon awakening have resolved. She does continue to have some dyspnea with exertion. She had a stress test recently, which was normal. We did discuss deconditioning as a possible cause. She is walking in a mall daily with her husband and is planning on slowly increasing the time she walks daily.She is deferring on Pulmonary rehab for now. We will consider if the exercise she does at home does not help her dyspnea. She is anxious to stop her Eliquis for DVT 03/2016. I will check with Dr. Halford Chessman regarding his preference for length of  Treatment. This was thought to be a provoked DVT 2/2 hip surgery. There is a family history of PE x 3 in maternal uncle.She denies chest pain, fever, orthopnea or hemoptysis.  Blood Pressure re-check by me 108/68.  Ask Halford Chessman about coming off Eliquis now or in Sept. Family history of DVT with uncle ( Maternal)  Test Results: CPAP DownLoad:AutoSet 5-15 cm H2O Wore 30/30 days ( 100%) > 4 hours 30 days Average use 7 hours and 32 minutes AHI 0.5  PFT's WNL Stress test WNL  CBC Latest Ref Rng & Units 04/21/2016 03/16/2016 03/14/2016  WBC 4.0 - 10.5 K/uL 6.3 5.2 6.6  Hemoglobin 12.0 - 15.0 g/dL 12.7 11.4(L) 12.1(A)  Hematocrit 36.0 - 46.0 % 39.0 37.3 38.2  Platelets 150.0 - 400.0 K/uL 313.0 485(H) 496(H)    BMP Latest Ref Rng & Units 04/21/2016 03/16/2016 03/14/2016  Glucose 70 - 99 mg/dL 89 92 84  BUN 6 - 23 mg/dL 14 10 10   Creatinine 0.40 - 1.20 mg/dL 1.02 0.83 1.01(H)  BUN/Creat Ratio 9 - 23 - - 10  Sodium 135 - 145 mEq/L 139 138 141  Potassium 3.5  - 5.1 mEq/L 4.2 4.3 4.5  Chloride 96 - 112 mEq/L 105 106 99  CO2 19 - 32 mEq/L 28 26 23   Calcium 8.4 - 10.5 mg/dL 8.8 9.1 9.3    BNP    Component Value Date/Time   BNP 11.6 03/16/2016 1125    ProBNP    Component Value Date/Time   PROBNP 23.0 04/21/2016 1542    PFT    Component Value Date/Time   FEV1PRE 2.52 12/07/2016 0959   FEV1POST 2.54 12/07/2016 0959   FVCPRE 3.16 12/07/2016 0959   FVCPOST 3.07 12/07/2016 0959   TLC 4.57 12/07/2016 0959   DLCOUNC 20.83 12/07/2016 0959   PREFEV1FVCRT 80 12/07/2016 0959   PSTFEV1FVCRT 83 12/07/2016 0959    Nm Myocar Multi W/spect W/wall Motion / Ef  Result Date: 12/14/2016  No diagnostic ST segment changes to indicate ischemia. Nonlimiting chest pain reported. Overall intermediate risk Duke treadmill score 4 due to the presence of chest pain, although there were no diagnostic ST segment abnormalities.  Blood pressure demonstrated a normal response to exercise.  No significant myocardial perfusion defects to indicate scar or ischemia. There is mild breast attenuation.  This is a low risk study based on perfusion imaging.  Nuclear stress EF: 75%.      Past medical hx Past Medical History:  Diagnosis  Date  . Arthritis   . Bilateral conjunctivitis   . Fibromyalgia   . GERD (gastroesophageal reflux disease)   . History of pulmonary embolus (PE)    August 2017 after hip surgery  . Hypothyroidism   . OSA (obstructive sleep apnea)      Social History  Substance Use Topics  . Smoking status: Former Smoker    Packs/day: 0.50    Years: 11.00    Types: Cigarettes    Quit date: 04/15/1996  . Smokeless tobacco: Never Used     Comment: quit smoking20 years ago   . Alcohol use Yes     Comment: rare    Tobacco Cessation: Former smoker quit 1997  Past surgical hx, Family hx, Social hx all reviewed.  Current Outpatient Prescriptions on File Prior to Visit  Medication Sig  . levothyroxine (SYNTHROID, LEVOTHROID) 100 MCG tablet  TAKE ONE TABLET BY MOUTH ONCE DAILY  . Multiple Vitamin (MULTIVITAMIN WITH MINERALS) TABS tablet Take 1 tablet by mouth daily.  Marland Kitchen apixaban (ELIQUIS) 5 MG TABS tablet Take 1 tablet (5 mg total) by mouth 2 (two) times daily.   No current facility-administered medications on file prior to visit.      Allergies  Allergen Reactions  . Penicillins Hives, Shortness Of Breath and Other (See Comments)    Childhood reaction was told her reaction may have been shortness of breath and hives Has patient had a PCN reaction causing immediate rash, facial/tongue/throat swelling, SOB or lightheadedness with hypotension: yes Has patient had a PCN reaction causing severe rash involving mucus membranes or skin necrosis: no Has patient had a PCN reaction that required hospitalization no Has patient had a PCN reaction occurring within the last 10 years: no If all of the above answers are "NO", then may proceed   . Contrast Media [Iodinated Diagnostic Agents]     hives  . Flexeril [Cyclobenzaprine] Other (See Comments)    Heart problems - was told she should probably avoid all muscle relaxers   . Omnipaque [Iohexol] Hives and Itching    RN to give pt benadryl  . Soma [Carisoprodol] Other (See Comments)    Heart problems - was told she should probably avoid all muscle relaxers    Review Of Systems:  Constitutional:   No  weight loss, night sweats,  Fevers, chills, fatigue, or  lassitude.  HEENT:   No headaches,  Difficulty swallowing,  Tooth/dental problems, or  Sore throat,                No sneezing, itching, ear ache, nasal congestion, post nasal drip,   CV:  No chest pain,  Orthopnea, PND, swelling in lower extremities, anasarca, dizziness, palpitations, syncope.   GI  No heartburn, indigestion, abdominal pain, nausea, vomiting, diarrhea, change in bowel habits, loss of appetite, bloody stools.   Resp: + shortness of breath with exertion not  at rest.  No excess mucus, no productive cough,  No  non-productive cough,  No coughing up of blood.  No change in color of mucus.  No wheezing.  No chest wall deformity  Skin: no rash or lesions.  GU: no dysuria, change in color of urine, no urgency or frequency.  No flank pain, no hematuria   MS:  No joint pain or swelling.  No decreased range of motion.  No back pain.  Psych:  No change in mood or affect. No depression or anxiety.  No memory loss.   Vital Signs BP (!) 144/82 (BP Location:  Left Arm, Patient Position: Sitting, Cuff Size: Normal)   Pulse 72   Ht 5\' 4"  (1.626 m)   Wt 206 lb 3.2 oz (93.5 kg)   SpO2 99%   BMI 35.39 kg/m   Body mass index is 35.39 kg/m.  Physical Exam:  General- No distress,  A&Ox3, pleasant ENT: No sinus tenderness, TM clear, pale nasal mucosa, no oral exudate,no post nasal drip, no LAN Cardiac: S1, S2, regular rate and rhythm, no murmur Chest: No wheeze/ rales/ dullness; no accessory muscle use, no nasal flaring, no sternal retractions, diminished per bases Abd.: Soft Non-tender, obese Ext: No clubbing cyanosis, edema Neuro:  normal strength, deconditioned Skin: No rashes, warm and dry Psych: normal mood and behavior   Assessment/Plan  OSA on CPAP CPAP compliance 100% Plan:  Continue on CPAP at bedtime. You appear to be benefiting from the treatment Goal is to wear for at least 4-6 hours each night for maximal clinical benefit. Continue to work on weight loss, as the link between excess weight  and sleep apnea is well established.  Do not drive if sleepy.  Please contact office for sooner follow up if symptoms do not improve or worsen or seek emergency care   Follow up with Dr. Halford Chessman in 3 months to determine eligibility to stop Eliquis.   Pulmonary embolism [facility]) Patient saw hematology. They feel the blood clot came from her hip replacement surgery. They recommended Eliquis for at least 6 months. They stated due to the extent of her pulmonary embolism it would be reasonable to do  anticoagulation up to 9-12 months. Another alternative would be switching to prophylactic aspirin after 6 months of Eliquis. Plan: Continue Eliquis daily for now. Follow up with Dr. Halford Chessman in 3 months I will check with Dr. Halford Chessman for preference of 9 vs 12 months treatment.  Dyspnea Stress test negative PFT's WNL Suspect deconditioning Plan Exercise at home If no improvement consider pulmonary rehab    Magdalen Spatz, NP 01/02/2017  3:30 PM

## 2017-01-02 NOTE — Patient Instructions (Addendum)
It is nice to see you  again. I am glad you are feeling  better. Continue your Eliquis daily for now. Continue to increase your activity to see if shortness of breath improves with conditioning. If shortness of breath is no better, consider Pulmonary Rehab. Continue on CPAP at bedtime. You appear to be benefiting from the treatment Goal is to wear for at least 4-6 hours each night for maximal clinical benefit. Continue to work on weight loss, as the link between excess weight  and sleep apnea is well established.  Do not drive if sleepy.  Please contact office for sooner follow up if symptoms do not improve or worsen or seek emergency care   Follow up with Dr. Halford Chessman in 3 months to determine eligibility to stop Eliquis.

## 2017-01-02 NOTE — Assessment & Plan Note (Signed)
Stress test negative PFT's WNL Suspect deconditioning Plan Exercise at home If no improvement consider pulmonary rehab

## 2017-01-02 NOTE — Telephone Encounter (Signed)
Blood work ordered in EPIC. Patient notified. 

## 2017-01-02 NOTE — Assessment & Plan Note (Signed)
CPAP compliance 100% Plan:  Continue on CPAP at bedtime. You appear to be benefiting from the treatment Goal is to wear for at least 4-6 hours each night for maximal clinical benefit. Continue to work on weight loss, as the link between excess weight  and sleep apnea is well established.  Do not drive if sleepy.  Please contact office for sooner follow up if symptoms do not improve or worsen or seek emergency care   Follow up with Dr. Halford Chessman in 3 months to determine eligibility to stop Eliquis.

## 2017-01-03 ENCOUNTER — Telehealth: Payer: Self-pay | Admitting: Acute Care

## 2017-01-03 NOTE — Telephone Encounter (Signed)
Please call patient. Let her know I have spoken to Dr. Halford Chessman. Have her go to [facility], or whichever [facility] facility is closest to her, and have a d-dimer checked. Let her know if it is normal, I will most likely d/c the Eliquis. Please place the order for d-dimer and have her go when it is convenient for her. Thanks so much.

## 2017-01-04 NOTE — Telephone Encounter (Signed)
Left message for patient to contact office. Order will be placed after speaking with patient.

## 2017-01-05 ENCOUNTER — Telehealth: Payer: Self-pay | Admitting: Pulmonary Disease

## 2017-01-05 DIAGNOSIS — I2699 Other pulmonary embolism without acute cor pulmonale: Secondary | ICD-10-CM

## 2017-01-05 NOTE — Telephone Encounter (Signed)
Pt says that it will be ok to leave message on machine.Wendy Marshall

## 2017-01-05 NOTE — Telephone Encounter (Signed)
Left message for patient to contact office.

## 2017-01-05 NOTE — Telephone Encounter (Signed)
Attempted to contact pt. No answer, no option to leave a message. Will try back.  

## 2017-01-06 ENCOUNTER — Other Ambulatory Visit: Payer: Self-pay | Admitting: *Deleted

## 2017-01-06 ENCOUNTER — Telehealth: Payer: Self-pay | Admitting: Family Medicine

## 2017-01-06 MED ORDER — LEVOTHYROXINE SODIUM 100 MCG PO TABS: 100.0000 ug | ORAL_TABLET | Freq: Every day | ORAL | 0 refills | Status: DC

## 2017-01-06 NOTE — Telephone Encounter (Signed)
Pt returned call and says that she wants to have the d-dimer don @ [facility] in [place] on [address].Theodoro Grist

## 2017-01-06 NOTE — Telephone Encounter (Signed)
Pt has not done bloodwork yet. Husband states she will do on Monday and she will run out of med. 30 day supply sent to pharm. Pt's husband notified.

## 2017-01-06 NOTE — Telephone Encounter (Signed)
Pt's husband called to schedule an appt for the pt for a thyroid check. Pt wanted Tues when there was no availability. Pt is wanting to know if medication can be called in after labs are reviewed. Please advise.

## 2017-01-06 NOTE — Telephone Encounter (Signed)
I have called and left a detailed message on the machine for the pt to call us back---need to make sure where and that she will go to have this d dimer done before we place the order per SG.

## 2017-01-06 NOTE — Telephone Encounter (Signed)
Called and spoke with patient, aware that we are faxing an order to [facility] for her blood work.  Called [facility], spoke wit Wendy Marshall and verified information to fax orders.  Specific instructions to contact our office once completed and also to fax the results typed on the order.  This has been faxed to [phone]. Nothing further needed.

## 2017-01-09 ENCOUNTER — Encounter: Payer: Self-pay | Admitting: Internal Medicine

## 2017-01-10 ENCOUNTER — Telehealth: Payer: Self-pay | Admitting: Pulmonary Disease

## 2017-01-10 LAB — BASIC METABOLIC PANEL
BUN/Creatinine Ratio: 24 — ABNORMAL HIGH (ref 9–23)
BUN: 20 mg/dL (ref 6–24)
CO2: 24 mmol/L (ref 20–29)
Calcium: 9.4 mg/dL (ref 8.7–10.2)
Chloride: 105 mmol/L (ref 96–106)
Creatinine, Ser: 0.85 mg/dL (ref 0.57–1.00)
GFR calc Af Amer: 87 mL/min/{1.73_m2} (ref >59)
GFR calc non Af Amer: 76 mL/min/{1.73_m2} (ref >59)
Glucose: 85 mg/dL (ref 65–99)
Potassium: 4.5 mmol/L (ref 3.5–5.2)
Sodium: 141 mmol/L (ref 134–144)

## 2017-01-10 LAB — HEPATIC FUNCTION PANEL
ALT: 11 IU/L (ref 0–32)
AST: 16 IU/L (ref 0–40)
Albumin: 4.5 g/dL (ref 3.5–5.5)
Alkaline Phosphatase: 81 IU/L (ref 39–117)
Bilirubin Total: 0.5 mg/dL (ref 0.0–1.2)
Bilirubin, Direct: 0.12 mg/dL (ref 0.00–0.40)
Total Protein: 7.4 g/dL (ref 6.0–8.5)

## 2017-01-10 LAB — LIPID PANEL
Chol/HDL Ratio: 4.1 ratio (ref 0.0–4.4)
Cholesterol, Total: 253 mg/dL — ABNORMAL HIGH (ref 100–199)
HDL: 62 mg/dL (ref >39)
LDL Calculated: 160 mg/dL — ABNORMAL HIGH (ref 0–99)
Triglycerides: 157 mg/dL — ABNORMAL HIGH (ref 0–149)
VLDL Cholesterol Cal: 31 mg/dL (ref 5–40)

## 2017-01-10 LAB — TSH: TSH: 1.56 u[IU]/mL (ref 0.450–4.500)

## 2017-01-10 LAB — VITAMIN D 25 HYDROXY (VIT D DEFICIENCY, FRACTURES): Vit D, 25-Hydroxy: 19.7 ng/mL — ABNORMAL LOW (ref 30.0–100.0)

## 2017-01-10 NOTE — Telephone Encounter (Signed)
lmtcb x2 for pt. 

## 2017-01-10 NOTE — Telephone Encounter (Signed)
Spoke with pt. She is returning our call from earlier today. The encounter dated 01/03/17 had been closed, that is why it was not checked for documentation of a phone call. Pt states that she has already gone to [facility] in [place] to have this D-Dimer drawn. Lab was drawn on 01/09/17. These results have been received yet. Will await lab results.

## 2017-01-10 NOTE — Telephone Encounter (Signed)
Margie did you call this pt? I do not see anywhere in her chart where you did. Thanks!

## 2017-01-10 NOTE — Telephone Encounter (Signed)
Please see 01/03/17 phone note. Thanks.

## 2017-01-12 NOTE — Telephone Encounter (Signed)
Per SG >> pt's D-Dimer is negative.  lmcb x1 for pt.

## 2017-01-12 NOTE — Telephone Encounter (Signed)
I have checked SG's look at and spoke with SG. These results have been received. I have contacted [facility] [phone]) to have these faxed.  Results have been received and given to SG. Message will be routed to [place] for follow up.

## 2017-01-12 NOTE — Telephone Encounter (Signed)
Pt returned phone call, ok to leave message on voicemail ([phone]), does not get off work until after 5pm..ert

## 2017-01-12 NOTE — Telephone Encounter (Signed)
Spoke with the pt and notified of results  She asked if this means she should stop eliquis  I advised per AVS she should await ov with VS to determine this  She verbalized understanding and nothing further needed

## 2017-01-30 ENCOUNTER — Telehealth: Payer: Self-pay | Admitting: Pulmonary Disease

## 2017-01-30 ENCOUNTER — Other Ambulatory Visit: Payer: Self-pay | Admitting: *Deleted

## 2017-01-30 MED ORDER — LEVOTHYROXINE SODIUM 100 MCG PO TABS: 100.0000 ug | ORAL_TABLET | Freq: Every day | ORAL | 2 refills | Status: DC

## 2017-01-30 MED ORDER — APIXABAN 5 MG PO TABS: 5.0000 mg | ORAL_TABLET | Freq: Two times a day (BID) | ORAL | 3 refills | Status: DC

## 2017-01-30 NOTE — Progress Notes (Signed)
Ok per dr Nicki Reaper to refill. Called to let her know she states she got in touch with specialist and they will refill blood thinner and now she just needs thyroid med. Refill sent to pharm.

## 2017-01-30 NOTE — Progress Notes (Signed)
Left message to return call 

## 2017-01-30 NOTE — Progress Notes (Signed)
I do not want this patient to run out of medication. But it appears that the patient is now under the care of pulmonology for this issue. Please call and talk with the patient to have her call her pulmonologist office so they will send in refills on this medicine. Please document that she understands it is necessary for her to do this and will do so.

## 2017-01-30 NOTE — Telephone Encounter (Signed)
Spoke with pt. She is aware of SG's response. Pt requests to continue Eliquis until she sees VS. Rx has been sent in. Nothing further was needed.

## 2017-01-30 NOTE — Telephone Encounter (Signed)
Please let patient know since her d-dimer was negative, it is okay to stop eliquis. If she would prefer to wait until she is spoken with Dr. Halford Chessman at her next appointment, it is certainly okay to refill her Eliquis. Thank you

## 2017-01-30 NOTE — Progress Notes (Signed)
Pt states they told her to take til sept. She is already out of blood thinner. She asked them 3 days ago to refill and they have not. Pt also needs thyroid medication.

## 2017-01-30 NOTE — Telephone Encounter (Signed)
Spoke with pt. She is needing a refill on Eliquis. Per SG's documentation >> 01/03/2017 Please call patient. Let her know I have spoken to Dr. Halford Chessman. Have her go to [facility], or whichever [facility] facility is closest to her, and have a d-dimer checked. Let her know if it is normal, I will most likely d/c the Eliquis. Please place the order for d-dimer and have her go when it is convenient for her. Thanks so much.     Her blood work came back negative.  SG - please advise if pt should continue Eliquis. Thanks.

## 2017-02-17 ENCOUNTER — Encounter: Payer: Self-pay | Admitting: Nurse Practitioner

## 2017-02-17 ENCOUNTER — Ambulatory Visit (INDEPENDENT_AMBULATORY_CARE_PROVIDER_SITE_OTHER): Payer: BLUE CROSS/BLUE SHIELD | Admitting: Nurse Practitioner

## 2017-02-17 VITALS — BP 132/80 | Ht 64.0 in | Wt 205.0 lb

## 2017-02-17 DIAGNOSIS — E559 Vitamin D deficiency, unspecified: Secondary | ICD-10-CM

## 2017-02-17 DIAGNOSIS — M2550 Pain in unspecified joint: Secondary | ICD-10-CM | POA: Diagnosis not present

## 2017-02-17 DIAGNOSIS — R5383 Other fatigue: Secondary | ICD-10-CM | POA: Diagnosis not present

## 2017-02-17 DIAGNOSIS — Z780 Asymptomatic menopausal state: Secondary | ICD-10-CM | POA: Diagnosis not present

## 2017-02-17 DIAGNOSIS — E785 Hyperlipidemia, unspecified: Secondary | ICD-10-CM

## 2017-02-17 DIAGNOSIS — E038 Other specified hypothyroidism: Secondary | ICD-10-CM | POA: Diagnosis not present

## 2017-02-17 MED ORDER — ROSUVASTATIN CALCIUM 5 MG PO TABS: 5.0000 mg | ORAL_TABLET | Freq: Every day | ORAL | 2 refills | Status: DC

## 2017-02-17 MED ORDER — VITAMIN D (ERGOCALCIFEROL) 1.25 MG (50000 UNIT) PO CAPS: 50000.0000 [IU] | ORAL_CAPSULE | ORAL | 2 refills | Status: DC

## 2017-02-17 MED ORDER — LEVOTHYROXINE SODIUM 100 MCG PO TABS: 100.0000 ug | ORAL_TABLET | Freq: Every day | ORAL | 3 refills | Status: DC

## 2017-02-20 ENCOUNTER — Encounter: Payer: Self-pay | Admitting: Nurse Practitioner

## 2017-02-20 DIAGNOSIS — E559 Vitamin D deficiency, unspecified: Secondary | ICD-10-CM | POA: Insufficient documentation

## 2017-02-20 DIAGNOSIS — E785 Hyperlipidemia, unspecified: Secondary | ICD-10-CM | POA: Insufficient documentation
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
## 2017-02-21 LAB — LYME AB/WESTERN BLOT REFLEX
LYME DISEASE AB, QUANT, IGM: <0.8 index (ref 0.00–0.79)
Lyme IgG/IgM Ab: <0.91 {ISR} (ref 0.00–0.90)

## 2017-02-21 LAB — ANA: Anti Nuclear Antibody(ANA): NEGATIVE

## 2017-02-21 LAB — RHEUMATOID FACTOR: Rhuematoid fact SerPl-aCnc: <10 IU/mL (ref 0.0–13.9)

## 2017-03-09 ENCOUNTER — Other Ambulatory Visit (HOSPITAL_COMMUNITY): Payer: Self-pay | Admitting: Orthopedic Surgery

## 2017-03-09 DIAGNOSIS — Z96649 Presence of unspecified artificial hip joint: Principal | ICD-10-CM

## 2017-03-09 DIAGNOSIS — T84038A Mechanical loosening of other internal prosthetic joint, initial encounter: Secondary | ICD-10-CM

## 2017-03-15 ENCOUNTER — Encounter: Payer: BLUE CROSS/BLUE SHIELD | Admitting: Nurse Practitioner

## 2017-03-15 ENCOUNTER — Encounter (HOSPITAL_COMMUNITY)
Admission: RE | Admit: 2017-03-15 | Discharge: 2017-03-15 | Disposition: A | Discharge status: Home / Self Care | Payer: BLUE CROSS/BLUE SHIELD | Source: Ambulatory Visit | Attending: Orthopedic Surgery | Admitting: Orthopedic Surgery

## 2017-03-15 DIAGNOSIS — M5136 Other intervertebral disc degeneration, lumbar region: Secondary | ICD-10-CM | POA: Diagnosis not present

## 2017-03-15 DIAGNOSIS — Z96649 Presence of unspecified artificial hip joint: Secondary | ICD-10-CM | POA: Insufficient documentation

## 2017-03-15 DIAGNOSIS — T84038A Mechanical loosening of other internal prosthetic joint, initial encounter: Secondary | ICD-10-CM

## 2017-03-15 MED ORDER — TECHNETIUM TC 99M MEDRONATE IV KIT
25.0000 | PACK | Freq: Once | INTRAVENOUS | Status: AC | PRN
  Administered 2017-03-15: 25 via INTRAVENOUS

## 2017-04-24 ENCOUNTER — Encounter: Payer: Self-pay | Admitting: Pulmonary Disease

## 2017-04-24 ENCOUNTER — Ambulatory Visit (INDEPENDENT_AMBULATORY_CARE_PROVIDER_SITE_OTHER): Payer: BLUE CROSS/BLUE SHIELD | Admitting: Pulmonary Disease

## 2017-04-24 VITALS — BP 142/86 | HR 78 | Ht 64.0 in | Wt 206.0 lb

## 2017-04-24 DIAGNOSIS — Z86711 Personal history of pulmonary embolism: Secondary | ICD-10-CM

## 2017-04-24 DIAGNOSIS — G4733 Obstructive sleep apnea (adult) (pediatric): Secondary | ICD-10-CM | POA: Diagnosis not present

## 2017-04-24 DIAGNOSIS — Z9989 Dependence on other enabling machines and devices: Secondary | ICD-10-CM | POA: Diagnosis not present

## 2017-04-24 NOTE — Progress Notes (Signed)
Current Outpatient Prescriptions on File Prior to Visit  Medication Sig  . levothyroxine (SYNTHROID, LEVOTHROID) 100 MCG tablet Take 1 tablet (100 mcg total) by mouth daily.  . Multiple Vitamin (MULTIVITAMIN WITH MINERALS) TABS tablet Take 1 tablet by mouth daily.  . rosuvastatin (CRESTOR) 5 MG tablet Take 1 tablet (5 mg total) by mouth daily.  . Vitamin D, Ergocalciferol, (DRISDOL) 50000 units CAPS capsule Take 1 capsule (50,000 Units total) by mouth every 7 (seven) days.   No current facility-administered medications on file prior to visit.      Chief Complaint  Patient presents with  . Follow-up    Pt using CPAP each night, DME- [facility]. Pt gets up 2:30am each morning for work, tries to goes to bed no later than 8pm each night.     Pulmonary tests CT angio chest 03/09/16 >> b/l PE, small Rt effusion CT angio chest 03/16/16 >> decreased clot burden, Rt effusion V/Q 04/11/16 >> no PE  Cardiac tests Echo 12/14/16 >> mild LVH, EF 60 to 65%  Sleep tests HST 04/28/16 >> AHI 22.7, SaO2 low 77% Auto CPAP 03/22/17 to 04/20/17 >> used on 29 of 30 nights with average 6 hrs 26 min.  Average AHI 0.7 with median CPAP 9 and 95 th percentile CPAP 11 cm H2O  Past medical history Fibromyalgia, GERD, PE after hip surgery August 2017, Hypothyroidism  Past surgical history, Family history, Social history, Allergies all reviewed.  Vital Signs BP (!) 142/86 (BP Location: Left Arm, Cuff Size: Normal)   Pulse 78   Ht 5\' 4"  (1.626 m)   Wt 206 lb (93.4 kg)   SpO2 98%   BMI 35.36 kg/m   History of Present Illness Wendy Marshall is a 61 y.o. female with obstructive sleep apnea.  She goes to bed at 8 pm and gets up at 230 am.  She does this for her work schedule.  She sleeps in for an extra 3 to 4 hours on weekends.    She uses CPAP nightly.  Gets occasional soreness around her lip, but not bad and not leaving any marks.  She feels CPAP helps.  She was taken off eliquis several months ago.  She was  questioning whether she needed to be on aspirin.   Physical Exam  General - pleasant Eyes - pupils reactive, wears glasses ENT - no sinus tenderness, no oral exudate, no LAN Cardiac - regular, no murmur Chest - no wheeze, rales Abd - soft, non tender Ext - no edema Skin - no rashes Neuro - normal strength Psych - normal mood    Assessment/Plan  Obstructive sleep apnea. - she is compliant with CPAP and reports benefit from CPAP - continue auto CPAP  Hx of pulmonary embolism. - this was provoked in the setting of hip surgery - extensively reviewed the risk/benefit of long term anticoagulation - at this time I recommend no additional anticoagulation, including aspirin prophylaxis - discussed techniques she can use to reduce her risk of recurrent DVT when travelling   Patient Instructions  Follow up in 1 year   Time spent 27 minutes  Chesley Mires, MD [facility] Pager:  [phone] 04/24/2017, 12:02 PM

## 2017-04-24 NOTE — Patient Instructions (Signed)
Follow up in 1 year.

## 2017-05-19 ENCOUNTER — Ambulatory Visit
Admission: RE | Admit: 2017-05-19 | Discharge: 2017-05-19 | Disposition: A | Discharge status: Home / Self Care | Payer: Worker's Compensation | Source: Ambulatory Visit | Attending: Family | Admitting: Family

## 2017-05-19 ENCOUNTER — Other Ambulatory Visit: Payer: Self-pay | Admitting: Family

## 2017-05-19 DIAGNOSIS — M7989 Other specified soft tissue disorders: Secondary | ICD-10-CM | POA: Diagnosis not present

## 2017-05-19 DIAGNOSIS — R52 Pain, unspecified: Secondary | ICD-10-CM

## 2017-05-19 DIAGNOSIS — M79661 Pain in right lower leg: Secondary | ICD-10-CM | POA: Diagnosis not present

## 2017-05-19 DIAGNOSIS — R609 Edema, unspecified: Secondary | ICD-10-CM

## 2017-07-15 ENCOUNTER — Other Ambulatory Visit: Payer: Self-pay | Admitting: Nurse Practitioner

## 2017-08-01 DIAGNOSIS — I2699 Other pulmonary embolism without acute cor pulmonale: Secondary | ICD-10-CM

## 2017-08-01 DIAGNOSIS — S83249A Other tear of medial meniscus, current injury, unspecified knee, initial encounter: Secondary | ICD-10-CM

## 2017-08-01 DIAGNOSIS — M94261 Chondromalacia, right knee: Secondary | ICD-10-CM

## 2017-08-01 HISTORY — DX: Other tear of medial meniscus, current injury, unspecified knee, initial encounter: S83.249A

## 2017-08-01 HISTORY — DX: Chondromalacia, right knee: M94.261

## 2017-08-01 HISTORY — DX: Other pulmonary embolism without acute cor pulmonale: I26.99

## 2017-08-18 ENCOUNTER — Other Ambulatory Visit: Payer: Self-pay | Admitting: Orthopedic Surgery

## 2017-08-18 ENCOUNTER — Encounter (HOSPITAL_BASED_OUTPATIENT_CLINIC_OR_DEPARTMENT_OTHER): Payer: Self-pay | Admitting: *Deleted

## 2017-08-18 ENCOUNTER — Other Ambulatory Visit: Payer: Self-pay

## 2017-08-22 DIAGNOSIS — S83241A Other tear of medial meniscus, current injury, right knee, initial encounter: Secondary | ICD-10-CM | POA: Diagnosis present

## 2017-08-22 NOTE — H&P (Signed)
Wendy Marshall is an 62 y.o. female.   Chief Complaint: Right Knee Pain  HPI: Wendy Marshall returns today with continued catching popping and pain in her right knee that was injured at work on 05/16/17, she does production for [employer].  The anesthetic and cortisone injection provided excellent pain relief for 2-3 hours.  She then had what sounds like an adverse reaction of the cortisone and her pain persists.  The injection was done 06/27/17.  She is presently on a light duty note for seated work with frequent changes in position and she reports no light duty is available.  Regarding surgery she had a hip replaced with Dr. Charm Rings a year ago and had a saddle embolus postoperatively while on aspirin and was on Eliquis for one year.  Past Medical History:  Diagnosis Date  . Arthritis    "all over"  . Chondromalacia of knee, right 08/2017  . Complication of anesthesia    hard to wake up post-op  . Dental crowns present    x 2  . Difficulty swallowing pills    since cervical fusion  . Family history of adverse reaction to anesthesia    pt's mother has hx. of being hard to wake up post-op  . GERD (gastroesophageal reflux disease)    no current med.  Marland Kitchen Headache    x 3 days (08/18/2017)  . High cholesterol   . History of pulmonary embolus (PE) 03/09/2016   bilateral  . Hypothyroidism   . Limited joint range of motion (ROM)    cervical spine - s/p fusion  . Medial meniscus tear 08/2017   right knee  . Sleep apnea    uses CPAP nightly    Past Surgical History:  Procedure Laterality Date  . ANTERIOR CERVICAL DECOMP/DISCECTOMY FUSION  07/18/2002   C5-6  . CARPAL TUNNEL RELEASE Bilateral   . EYE MUSCLE SURGERY Bilateral   . SHOULDER ARTHROSCOPY Left 06/11/2001  . TONSILLECTOMY     age 80  . TOTAL HIP ARTHROPLASTY Left 02/01/2016   Procedure: LEFT TOTAL HIP ARTHROPLASTY ANTERIOR APPROACH;  Surgeon: Paralee Cancel, MD;  Location: [facility];  Service: Orthopedics;  Laterality: Left;  Failed  Spinal to LMA  . TUBAL LIGATION    . WISDOM TOOTH EXTRACTION      Family History  Problem Relation Age of Onset  . Hypertension Mother   . Anesthesia problems Mother        hard to wake up post-op  . Brain cancer Father   . Colon cancer Other   . Heart attack Other   . Throat cancer Other    Social History:  reports that she quit smoking about 21 years ago. She smoked 0.00 packs per day for 11.00 years. she has never used smokeless tobacco. She reports that she drinks alcohol. She reports that she does not use drugs.  Allergies:  Allergies  Allergen Reactions  . Penicillins Hives and Shortness Of Breath  . Adhesive [Tape] Other (See Comments)    SKIN TEARS  . Contrast Media [Iodinated Diagnostic Agents] Hives  . Flexeril [Cyclobenzaprine] Other (See Comments)    AFFECTED HEART RATE  . Omnipaque [Iohexol] Hives  . Soma [Carisoprodol] Other (See Comments)    AFFECTED HEART RATE    No medications prior to admission.    No results found for this or any previous visit (from the past 48 hour(s)). No results found.  Review of Systems  Constitutional: Negative.   HENT: Negative.   Eyes: Negative.  Respiratory: Negative.   Cardiovascular:       Hx of blood clots  Gastrointestinal: Negative.   Genitourinary: Negative.   Musculoskeletal: Positive for joint pain.  Skin: Negative.   Neurological: Negative.   Endo/Heme/Allergies: Bruises/bleeds easily.  Psychiatric/Behavioral: Negative.     Height 5\' 4"  (1.626 m), weight 90.7 kg (200 lb). Physical Exam  Constitutional: She is oriented to person, place, and time. She appears well-developed and well-nourished.  HENT:  Head: Normocephalic and atraumatic.  Eyes: Pupils are equal, round, and reactive to light.  Neck: Normal range of motion. Neck supple.  Cardiovascular: Intact distal pulses.  Respiratory: Effort normal.  Musculoskeletal: She exhibits tenderness.  Neurological: She is alert and oriented to person, place,  and time.  Skin: Skin is warm and dry.  Psychiatric: She has a normal mood and affect. Her behavior is normal. Judgment and thought content normal.     Assessment/Plan  Assess: Right knee mechanical symptoms with an MRI scan showing medial meniscal tear as well as some chondromalacia.  There is also a para meniscal cyst on the medial side.  Plan: Patient would like to proceed with arthroscopic evaluation treatment of her knee.  She will probably have a meniscectomy accomplished as well as debridement of articular cartilage that is torn.  In the meantime her work note is unchanged at seated work with frequent changes in position.  On the day of surgery, I will probably restart her on Eliquis 5 mg twice a day and keep her on it for 30 days after surgery.  Based on what happened after her total hip.  I did issue the prescription for Eliquis to the patient today.  Joanell Rising, PA-C 08/22/2017, 8:23 AM

## 2017-08-23 ENCOUNTER — Ambulatory Visit (HOSPITAL_BASED_OUTPATIENT_CLINIC_OR_DEPARTMENT_OTHER): Payer: Worker's Compensation | Admitting: Anesthesiology

## 2017-08-23 ENCOUNTER — Encounter (HOSPITAL_BASED_OUTPATIENT_CLINIC_OR_DEPARTMENT_OTHER): Payer: Self-pay | Admitting: Anesthesiology

## 2017-08-23 ENCOUNTER — Ambulatory Visit (HOSPITAL_BASED_OUTPATIENT_CLINIC_OR_DEPARTMENT_OTHER)
Admission: RE | Admit: 2017-08-23 | Discharge: 2017-08-23 | Disposition: A | Discharge status: Home / Self Care | Payer: Worker's Compensation | Source: Ambulatory Visit | Attending: Orthopedic Surgery | Admitting: Orthopedic Surgery

## 2017-08-23 ENCOUNTER — Other Ambulatory Visit: Payer: Self-pay

## 2017-08-23 ENCOUNTER — Encounter (HOSPITAL_BASED_OUTPATIENT_CLINIC_OR_DEPARTMENT_OTHER)
Admission: RE | Disposition: A | Discharge status: Home / Self Care | Payer: Self-pay | Source: Ambulatory Visit | Attending: Orthopedic Surgery

## 2017-08-23 DIAGNOSIS — Z86711 Personal history of pulmonary embolism: Secondary | ICD-10-CM | POA: Diagnosis not present

## 2017-08-23 DIAGNOSIS — Z981 Arthrodesis status: Secondary | ICD-10-CM | POA: Diagnosis not present

## 2017-08-23 DIAGNOSIS — Y9389 Activity, other specified: Secondary | ICD-10-CM | POA: Diagnosis not present

## 2017-08-23 DIAGNOSIS — Z6835 Body mass index (BMI) 35.0-35.9, adult: Secondary | ICD-10-CM | POA: Insufficient documentation

## 2017-08-23 DIAGNOSIS — Y9269 Other specified industrial and construction area as the place of occurrence of the external cause: Secondary | ICD-10-CM | POA: Insufficient documentation

## 2017-08-23 DIAGNOSIS — S83241A Other tear of medial meniscus, current injury, right knee, initial encounter: Secondary | ICD-10-CM | POA: Insufficient documentation

## 2017-08-23 DIAGNOSIS — Z91041 Radiographic dye allergy status: Secondary | ICD-10-CM | POA: Insufficient documentation

## 2017-08-23 DIAGNOSIS — M1990 Unspecified osteoarthritis, unspecified site: Secondary | ICD-10-CM | POA: Diagnosis not present

## 2017-08-23 DIAGNOSIS — Z87891 Personal history of nicotine dependence: Secondary | ICD-10-CM | POA: Insufficient documentation

## 2017-08-23 DIAGNOSIS — M797 Fibromyalgia: Secondary | ICD-10-CM | POA: Insufficient documentation

## 2017-08-23 DIAGNOSIS — K219 Gastro-esophageal reflux disease without esophagitis: Secondary | ICD-10-CM | POA: Insufficient documentation

## 2017-08-23 DIAGNOSIS — Z880 Allergy status to penicillin: Secondary | ICD-10-CM | POA: Diagnosis not present

## 2017-08-23 DIAGNOSIS — E7800 Pure hypercholesterolemia, unspecified: Secondary | ICD-10-CM | POA: Insufficient documentation

## 2017-08-23 DIAGNOSIS — Z9989 Dependence on other enabling machines and devices: Secondary | ICD-10-CM | POA: Diagnosis not present

## 2017-08-23 DIAGNOSIS — M94261 Chondromalacia, right knee: Secondary | ICD-10-CM | POA: Insufficient documentation

## 2017-08-23 DIAGNOSIS — Z888 Allergy status to other drugs, medicaments and biological substances status: Secondary | ICD-10-CM | POA: Insufficient documentation

## 2017-08-23 DIAGNOSIS — X58XXXA Exposure to other specified factors, initial encounter: Secondary | ICD-10-CM | POA: Insufficient documentation

## 2017-08-23 DIAGNOSIS — E039 Hypothyroidism, unspecified: Secondary | ICD-10-CM | POA: Diagnosis not present

## 2017-08-23 DIAGNOSIS — E669 Obesity, unspecified: Secondary | ICD-10-CM | POA: Diagnosis not present

## 2017-08-23 DIAGNOSIS — Y990 Civilian activity done for income or pay: Secondary | ICD-10-CM | POA: Diagnosis not present

## 2017-08-23 DIAGNOSIS — G4730 Sleep apnea, unspecified: Secondary | ICD-10-CM | POA: Insufficient documentation

## 2017-08-23 HISTORY — PX: CHONDROPLASTY: SHX5177

## 2017-08-23 HISTORY — DX: Other specified symptoms and signs involving the digestive system and abdomen: R19.8

## 2017-08-23 HISTORY — DX: Headache: R51

## 2017-08-23 HISTORY — DX: Other tear of medial meniscus, current injury, unspecified knee, initial encounter: S83.249A

## 2017-08-23 HISTORY — DX: Dysphagia, unspecified: R13.10

## 2017-08-23 HISTORY — PX: KNEE ARTHROSCOPY: SHX127

## 2017-08-23 HISTORY — DX: Chondromalacia, right knee: M94.261

## 2017-08-23 HISTORY — DX: Family history of other specified conditions: Z84.89

## 2017-08-23 HISTORY — DX: Headache, unspecified: R51.9

## 2017-08-23 HISTORY — DX: Pure hypercholesterolemia, unspecified: E78.00

## 2017-08-23 HISTORY — DX: Stiffness of unspecified joint, not elsewhere classified: M25.60

## 2017-08-23 HISTORY — DX: Dental restoration status: Z98.811

## 2017-08-23 HISTORY — PX: KNEE ARTHROSCOPY WITH MEDIAL MENISECTOMY: SHX5651

## 2017-08-23 HISTORY — DX: Sleep apnea, unspecified: G47.30

## 2017-08-23 SURGERY — ARTHROSCOPY, KNEE
Anesthesia: General | Site: Knee | Laterality: Right

## 2017-08-23 MED ORDER — FENTANYL CITRATE (PF) 100 MCG/2ML IJ SOLN
50.0000 ug | INTRAMUSCULAR | Status: DC | PRN
  Administered 2017-08-23: 50 ug via INTRAVENOUS
  Administered 2017-08-23: 100 ug via INTRAVENOUS

## 2017-08-23 MED ORDER — KETOROLAC TROMETHAMINE 30 MG/ML IJ SOLN: 30.0000 mg | Freq: Once | INTRAMUSCULAR | Status: DC | PRN

## 2017-08-23 MED ORDER — PROMETHAZINE HCL 25 MG/ML IJ SOLN: 6.2500 mg | INTRAMUSCULAR | Status: DC | PRN

## 2017-08-23 MED ORDER — LIDOCAINE 2% (20 MG/ML) 5 ML SYRINGE
INTRAMUSCULAR | Status: AC
  Filled 2017-08-23: qty 5

## 2017-08-23 MED ORDER — OXYCODONE HCL 5 MG PO TABS: 5.0000 mg | ORAL_TABLET | Freq: Once | ORAL | Status: DC | PRN

## 2017-08-23 MED ORDER — SCOPOLAMINE 1 MG/3DAYS TD PT72: 1.0000 | MEDICATED_PATCH | Freq: Once | TRANSDERMAL | Status: DC | PRN

## 2017-08-23 MED ORDER — FENTANYL CITRATE (PF) 100 MCG/2ML IJ SOLN
INTRAMUSCULAR | Status: AC
  Filled 2017-08-23: qty 2

## 2017-08-23 MED ORDER — ONDANSETRON HCL 4 MG/2ML IJ SOLN
INTRAMUSCULAR | Status: DC | PRN
  Administered 2017-08-23: 4 mg via INTRAVENOUS

## 2017-08-23 MED ORDER — MIDAZOLAM HCL 2 MG/2ML IJ SOLN
INTRAMUSCULAR | Status: AC
  Filled 2017-08-23: qty 2

## 2017-08-23 MED ORDER — LIDOCAINE HCL (CARDIAC) 20 MG/ML IV SOLN
INTRAVENOUS | Status: DC | PRN
  Administered 2017-08-23: 80 mg via INTRAVENOUS

## 2017-08-23 MED ORDER — MEPERIDINE HCL 25 MG/ML IJ SOLN: 6.2500 mg | INTRAMUSCULAR | Status: DC | PRN

## 2017-08-23 MED ORDER — MIDAZOLAM HCL 2 MG/2ML IJ SOLN
1.0000 mg | INTRAMUSCULAR | Status: DC | PRN
  Administered 2017-08-23: 2 mg via INTRAVENOUS

## 2017-08-23 MED ORDER — FENTANYL CITRATE (PF) 100 MCG/2ML IJ SOLN: 25.0000 ug | INTRAMUSCULAR | Status: DC | PRN

## 2017-08-23 MED ORDER — VANCOMYCIN HCL IN DEXTROSE 1-5 GM/200ML-% IV SOLN
INTRAVENOUS | Status: AC
  Filled 2017-08-23: qty 200

## 2017-08-23 MED ORDER — DEXAMETHASONE SODIUM PHOSPHATE 10 MG/ML IJ SOLN
INTRAMUSCULAR | Status: AC
  Filled 2017-08-23: qty 1

## 2017-08-23 MED ORDER — LACTATED RINGERS IV SOLN
INTRAVENOUS | Status: DC
  Administered 2017-08-23 (×2): via INTRAVENOUS

## 2017-08-23 MED ORDER — BUPIVACAINE-EPINEPHRINE 0.5% -1:200000 IJ SOLN
INTRAMUSCULAR | Status: DC | PRN
  Administered 2017-08-23: 20 mL

## 2017-08-23 MED ORDER — PROPOFOL 10 MG/ML IV BOLUS
INTRAVENOUS | Status: DC | PRN
  Administered 2017-08-23: 180 mg via INTRAVENOUS
  Administered 2017-08-23: 20 mg via INTRAVENOUS

## 2017-08-23 MED ORDER — CHLORHEXIDINE GLUCONATE 4 % EX LIQD: 60.0000 mL | Freq: Once | CUTANEOUS | Status: DC

## 2017-08-23 MED ORDER — VANCOMYCIN HCL IN DEXTROSE 1-5 GM/200ML-% IV SOLN
1000.0000 mg | INTRAVENOUS | Status: AC
  Administered 2017-08-23: 1000 mg via INTRAVENOUS

## 2017-08-23 MED ORDER — HYDROCODONE-ACETAMINOPHEN 5-325 MG PO TABS: 1.0000 | ORAL_TABLET | ORAL | 0 refills | Status: DC | PRN

## 2017-08-23 MED ORDER — ONDANSETRON HCL 4 MG/2ML IJ SOLN
INTRAMUSCULAR | Status: AC
  Filled 2017-08-23: qty 2

## 2017-08-23 MED ORDER — DEXAMETHASONE SODIUM PHOSPHATE 4 MG/ML IJ SOLN
INTRAMUSCULAR | Status: DC | PRN
  Administered 2017-08-23: 10 mg via INTRAVENOUS

## 2017-08-23 MED ORDER — OXYCODONE HCL 5 MG/5ML PO SOLN: 5.0000 mg | Freq: Once | ORAL | Status: DC | PRN

## 2017-08-23 SURGICAL SUPPLY — 38 items
BANDAGE ACE 6X5 VEL STRL LF (GAUZE/BANDAGES/DRESSINGS) ×2 IMPLANT
BLADE 4.2CUDA (BLADE) IMPLANT
BLADE CUTTER GATOR 3.5 (BLADE) ×1 IMPLANT
BLADE GREAT WHITE 4.2 (BLADE) IMPLANT
BNDG COHESIVE 6X5 TAN STRL LF (GAUZE/BANDAGES/DRESSINGS) ×2 IMPLANT
DRAPE ARTHROSCOPY W/POUCH 90 (DRAPES) ×2 IMPLANT
DURAPREP 26ML APPLICATOR (WOUND CARE) ×2 IMPLANT
ELECT MENISCUS 165MM 90D (ELECTRODE) IMPLANT
ELECT REM PT RETURN 9FT ADLT (ELECTROSURGICAL)
ELECTRODE REM PT RTRN 9FT ADLT (ELECTROSURGICAL) IMPLANT
GAUZE SPONGE 4X4 12PLY STRL (GAUZE/BANDAGES/DRESSINGS) ×2 IMPLANT
GAUZE XEROFORM 1X8 LF (GAUZE/BANDAGES/DRESSINGS) ×2 IMPLANT
GLOVE BIO SURGEON STRL SZ 6.5 (GLOVE) ×2 IMPLANT
GLOVE BIO SURGEON STRL SZ7.5 (GLOVE) ×2 IMPLANT
GLOVE BIO SURGEON STRL SZ8.5 (GLOVE) ×2 IMPLANT
GLOVE BIOGEL PI IND STRL 8 (GLOVE) ×1 IMPLANT
GLOVE BIOGEL PI IND STRL 9 (GLOVE) ×1 IMPLANT
GLOVE BIOGEL PI INDICATOR 8 (GLOVE) ×1
GLOVE BIOGEL PI INDICATOR 9 (GLOVE) ×1
GOWN STRL REUS W/ TWL LRG LVL3 (GOWN DISPOSABLE) ×2 IMPLANT
GOWN STRL REUS W/TWL LRG LVL3 (GOWN DISPOSABLE) ×4
GOWN STRL REUS W/TWL XL LVL3 (GOWN DISPOSABLE) ×2 IMPLANT
IV NS IRRIG 3000ML ARTHROMATIC (IV SOLUTION) ×2 IMPLANT
KNEE WRAP E Z 3 GEL PACK (MISCELLANEOUS) ×2 IMPLANT
MANIFOLD NEPTUNE II (INSTRUMENTS) IMPLANT
NDL SAFETY ECLIPSE 18X1.5 (NEEDLE) ×1 IMPLANT
NEEDLE HYPO 18GX1.5 SHARP (NEEDLE) ×2
PACK ARTHROSCOPY DSU (CUSTOM PROCEDURE TRAY) ×2 IMPLANT
PACK BASIN DAY SURGERY FS (CUSTOM PROCEDURE TRAY) ×2 IMPLANT
PAD ALCOHOL SWAB (MISCELLANEOUS) ×2 IMPLANT
PENCIL BUTTON HOLSTER BLD 10FT (ELECTRODE) IMPLANT
PROBE BIPOLAR ATHRO 135MM 90D (MISCELLANEOUS) IMPLANT
SLEEVE SCD COMPRESS KNEE MED (MISCELLANEOUS) IMPLANT
SYR 3ML 18GX1 1/2 (SYRINGE) IMPLANT
SYR 5ML LL (SYRINGE) ×2 IMPLANT
TOWEL OR 17X24 6PK STRL BLUE (TOWEL DISPOSABLE) ×2 IMPLANT
TUBING ARTHRO INFLOW-ONLY STRL (TUBING) ×2 IMPLANT
WATER STERILE IRR 1000ML POUR (IV SOLUTION) ×2 IMPLANT

## 2017-08-23 NOTE — Interval H&P Note (Signed)
History and Physical Interval Note:  08/23/2017 11:34 AM  Wendy Marshall  has presented today for surgery, with the diagnosis of RIGHT KNEE MEDIAL MENISCAL TEAR AND CHONDROMALACIA  The various methods of treatment have been discussed with the patient and family. After consideration of risks, benefits and other options for treatment, the patient has consented to  Procedure(s): ARTHROSCOPY RIGHT KNEE (Right) as a surgical intervention .  The patient's history has been reviewed, patient examined, no change in status, stable for surgery.  I have reviewed the patient's chart and labs.  Questions were answered to the patient's satisfaction.     Kerin Salen

## 2017-08-23 NOTE — Anesthesia Postprocedure Evaluation (Signed)
Anesthesia Post Note  Patient: JULL HARRAL  Procedure(s) Performed: ARTHROSCOPY RIGHT KNEE REMOVAL OF LOOSE BODIES (Right Knee) KNEE ARTHROSCOPY WITH MEDIAL MENISECTOMY (Right Knee) CHONDROPLASTY (Right Knee)     Patient location during evaluation: PACU Anesthesia Type: General Level of consciousness: sedated Pain management: pain level controlled Vital Signs Assessment: post-procedure vital signs reviewed and stable Respiratory status: spontaneous breathing and respiratory function stable Cardiovascular status: stable Postop Assessment: no apparent nausea or vomiting Anesthetic complications: no    Last Vitals:  Vitals:   08/23/17 1256 08/23/17 1326  BP: 123/66 119/86  Pulse: 80 77  Resp: 11 20  Temp:  (!) 36.4 C  SpO2: 98% 100%    Last Pain:  Vitals:   08/23/17 1326  TempSrc:   PainSc: 2         RLE Motor Response: Purposeful movement (08/23/17 1326) RLE Sensation: Full sensation (08/23/17 1326)      [place]

## 2017-08-23 NOTE — Discharge Instructions (Signed)

## 2017-08-23 NOTE — Anesthesia Preprocedure Evaluation (Addendum)
Anesthesia Evaluation  Patient identified by MRN, date of birth, ID band Patient awake    Reviewed: Allergy & Precautions, H&P , NPO status , Patient's Chart, lab work & pertinent test results  History of Anesthesia Complications (+) Family history of anesthesia reaction and history of anesthetic complications  Airway Mallampati: II  TM Distance: >3 FB Neck ROM: full    Dental no notable dental hx. (+) Teeth Intact   Pulmonary shortness of breath, sleep apnea , former smoker,    Pulmonary exam normal        Cardiovascular negative cardio ROS Normal cardiovascular exam Rhythm:Regular     Neuro/Psych  Headaches,  Neuromuscular disease negative psych ROS   GI/Hepatic Neg liver ROS, GERD  ,  Endo/Other  Hypothyroidism   Renal/GU negative Renal ROS  negative genitourinary   Musculoskeletal  (+) Arthritis , Fibromyalgia -  Abdominal (+) + obese,   Peds  Hematology negative hematology ROS (+)   Anesthesia Other Findings   Reproductive/Obstetrics negative OB ROS                             Anesthesia Physical  Anesthesia Plan  ASA: III  Anesthesia Plan: General   Post-op Pain Management:    Induction: Intravenous  PONV Risk Score and Plan: 3 and Ondansetron, Dexamethasone and Midazolam  Airway Management Planned: LMA  Additional Equipment:   Intra-op Plan:   Post-operative Plan: Extubation in OR  Informed Consent: I have reviewed the patients History and Physical, chart, labs and discussed the procedure including the risks, benefits and alternatives for the proposed anesthesia with the patient or authorized representative who has indicated his/her understanding and acceptance.   Dental advisory given  Plan Discussed with: CRNA  Anesthesia Plan Comments:         Anesthesia Quick Evaluation

## 2017-08-23 NOTE — Anesthesia Procedure Notes (Signed)
Procedure Name: LMA Insertion Date/Time: 08/23/2017 11:50 AM Performed by: Lyndee Leo, CRNA Pre-anesthesia Checklist: Patient identified, Emergency Drugs available, Suction available and Patient being monitored Patient Re-evaluated:Patient Re-evaluated prior to induction Oxygen Delivery Method: Circle system utilized Preoxygenation: Pre-oxygenation with 100% oxygen Induction Type: IV induction Ventilation: Mask ventilation without difficulty LMA: LMA inserted LMA Size: 3.0 Number of attempts: 1 Airway Equipment and Method: Bite block Placement Confirmation: positive ETCO2 Tube secured with: Tape Dental Injury: Teeth and Oropharynx as per pre-operative assessment

## 2017-08-23 NOTE — Op Note (Signed)
Pre-Op Dx: Right knee medial meniscal tear, chondromalacia, possible loose bodies  Postop Dx: Right knee anterior horn medial meniscal tear, chondromalacia grade 3 to the medial femoral condyle and trochlea with flap tears, multiple cartilaginous loose bodies up to 10 mm in length  Procedure: Partial arthroscopic medial meniscectomy, removal of loose bodies, debridement chondromalacia with flap tears trochlea and medial femoral condyle  Surgeon: Kathalene Frames. Mayer Camel M.D.  Assist: Kerry Hough. Barton Dubois  (present throughout entire procedure and necessary for timely completion of the procedure) Anes: General LMA  EBL: Minimal  Fluids: 800 cc   Indications: Catching popping and pain in the right knee. Pt has failed conservative treatment with anti-inflammatory medicines, physical therapy, and modified activites but did get good temporarily from an intra-articular cortisone injection.  MRI scan shows medial meniscal tear and chondromalacia of the medial femoral condyle and trochlea pain has recurred and patient desires elective arthroscopic evaluation and treatment of knee. Risks and benefits of surgery have been discussed and questions answered.  Procedure: Patient identified by arm band and taken to the operating room at the day surgery Center. The appropriate anesthetic monitors were attached, and General LMA anesthesia was induced without difficulty. Lateral post was applied to the table and the lower extremity was prepped and draped in usual sterile fashion from the ankle to the midthigh. Time out procedure was performed. We began the operation by making standard inferior lateral and inferior medial peripatellar portals with a #11 blade allowing introduction of the arthroscope through the inferior lateral portal and the out flow to the inferior medial portal. Pump pressure was set at 100 mmHg and diagnostic arthroscopy  revealed focal grade IV chondromalacia of the trochlea with flap tears debrided back to a  stable margin with a straight biter and a 3.5 mm Gator sucker shaver.  Moving the medial compartment we identified an anterior horn medial meniscal tear that was also debrided back to a stable margin with a 3 5 Gator sucker shaver.  We also encountered multiple cartilaginous loose bodies that were removed with the straight biters or the sucker shaver up to 10 mm in length.  Mild chondromalacia of the medial tibial plateau.  Chondromalacia grade 3 with flap tears of the medial femoral condyle likewise debrided with a 3 5 Gator sucker shaver.  The ACL and PCL are intact.  On the lateral side we encountered 2 more loose bodies about 5 or 6 mm in length.. The knee was irrigated out normal saline solution. A dressing of xerofoam 4 x 4 dressing sponges, web roll and an Ace wrap was applied. The patient was awakened extubated and taken to the recovery without difficulty.    Signed: Kerin Salen, MD

## 2017-08-23 NOTE — Transfer of Care (Signed)
Immediate Anesthesia Transfer of Care Note  Patient: Wendy Marshall  Procedure(s) Performed: ARTHROSCOPY RIGHT KNEE REMOVAL OF LOOSE BODIES (Right Knee) KNEE ARTHROSCOPY WITH MEDIAL MENISECTOMY (Right Knee) CHONDROPLASTY (Right Knee)  Patient Location: PACU  Anesthesia Type:General  Level of Consciousness: awake, sedated and patient cooperative  Airway & Oxygen Therapy: Patient Spontanous Breathing  Post-op Assessment: Report given to RN and Post -op Vital signs reviewed and stable  Post vital signs: Reviewed and stable  Last Vitals:  Vitals:   08/23/17 1030  BP: 132/70  Pulse: 85  Resp: 18  Temp: 36.8 C  SpO2: 98%    Last Pain:  Vitals:   08/23/17 1030  TempSrc: Oral  PainSc: 4       Patients Stated Pain Goal: 3 (00/17/49 4496)  Complications: No apparent anesthesia complications

## 2017-08-24 ENCOUNTER — Encounter (HOSPITAL_BASED_OUTPATIENT_CLINIC_OR_DEPARTMENT_OTHER): Payer: Self-pay | Admitting: Orthopedic Surgery

## 2017-08-30 ENCOUNTER — Emergency Department (HOSPITAL_BASED_OUTPATIENT_CLINIC_OR_DEPARTMENT_OTHER)
Admit: 2017-08-30 | Discharge: 2017-08-30 | Disposition: A | Discharge status: Home / Self Care | Payer: Worker's Compensation | Attending: Emergency Medicine | Admitting: Emergency Medicine

## 2017-08-30 ENCOUNTER — Emergency Department (HOSPITAL_COMMUNITY)
Admission: EM | Admit: 2017-08-30 | Discharge: 2017-08-30 | Disposition: A | Discharge status: Home / Self Care | Payer: Worker's Compensation | Attending: Emergency Medicine | Admitting: Emergency Medicine

## 2017-08-30 ENCOUNTER — Encounter (HOSPITAL_COMMUNITY): Payer: Self-pay

## 2017-08-30 ENCOUNTER — Other Ambulatory Visit: Payer: Self-pay

## 2017-08-30 ENCOUNTER — Emergency Department (HOSPITAL_COMMUNITY): Payer: Worker's Compensation

## 2017-08-30 DIAGNOSIS — R609 Edema, unspecified: Secondary | ICD-10-CM

## 2017-08-30 DIAGNOSIS — R0602 Shortness of breath: Secondary | ICD-10-CM | POA: Diagnosis not present

## 2017-08-30 DIAGNOSIS — Z96642 Presence of left artificial hip joint: Secondary | ICD-10-CM | POA: Insufficient documentation

## 2017-08-30 DIAGNOSIS — Z79899 Other long term (current) drug therapy: Secondary | ICD-10-CM | POA: Diagnosis not present

## 2017-08-30 DIAGNOSIS — Z87891 Personal history of nicotine dependence: Secondary | ICD-10-CM | POA: Insufficient documentation

## 2017-08-30 DIAGNOSIS — E039 Hypothyroidism, unspecified: Secondary | ICD-10-CM | POA: Insufficient documentation

## 2017-08-30 DIAGNOSIS — M79661 Pain in right lower leg: Secondary | ICD-10-CM | POA: Insufficient documentation

## 2017-08-30 LAB — CBC
HCT: 44.7 % (ref 36.0–46.0)
Hemoglobin: 14.7 g/dL (ref 12.0–15.0)
MCH: 27.5 pg (ref 26.0–34.0)
MCHC: 32.9 g/dL (ref 30.0–36.0)
MCV: 83.6 fL (ref 78.0–100.0)
Platelets: 274 10*3/uL (ref 150–400)
RBC: 5.35 MIL/uL — ABNORMAL HIGH (ref 3.87–5.11)
RDW: 14 % (ref 11.5–15.5)
WBC: 6.7 10*3/uL (ref 4.0–10.5)

## 2017-08-30 LAB — I-STAT TROPONIN, ED: Troponin i, poc: 0 ng/mL (ref 0.00–0.08)

## 2017-08-30 LAB — BASIC METABOLIC PANEL
Anion gap: 11 (ref 5–15)
BUN: 12 mg/dL (ref 6–20)
CO2: 24 mmol/L (ref 22–32)
Calcium: 9.5 mg/dL (ref 8.9–10.3)
Chloride: 105 mmol/L (ref 101–111)
Creatinine, Ser: 0.89 mg/dL (ref 0.44–1.00)
GFR calc Af Amer: >60 mL/min (ref >60)
GFR calc non Af Amer: >60 mL/min (ref >60)
Glucose, Bld: 103 mg/dL — ABNORMAL HIGH (ref 65–99)
Potassium: 4.1 mmol/L (ref 3.5–5.1)
Sodium: 140 mmol/L (ref 135–145)

## 2017-08-30 LAB — PROTIME-INR
INR: 1.06
Prothrombin Time: 13.8 seconds (ref 11.4–15.2)

## 2017-08-30 MED ORDER — DIPHENHYDRAMINE HCL 25 MG PO CAPS: 50.0000 mg | ORAL_CAPSULE | Freq: Once | ORAL | Status: DC

## 2017-08-30 MED ORDER — DIPHENHYDRAMINE HCL 50 MG/ML IJ SOLN: 50.0000 mg | Freq: Once | INTRAMUSCULAR | Status: DC

## 2017-08-30 MED ORDER — TECHNETIUM TC 99M DIETHYLENETRIAME-PENTAACETIC ACID
32.0000 | Freq: Once | INTRAVENOUS | Status: AC | PRN
  Administered 2017-08-30: 32 via INTRAVENOUS

## 2017-08-30 MED ORDER — TECHNETIUM TO 99M ALBUMIN AGGREGATED
4.0000 | Freq: Once | INTRAVENOUS | Status: AC | PRN
  Administered 2017-08-30: 4 via INTRAVENOUS

## 2017-08-30 MED ORDER — HYDROCORTISONE NA SUCCINATE PF 250 MG IJ SOLR
200.0000 mg | Freq: Once | INTRAMUSCULAR | Status: DC
  Filled 2017-08-30: qty 200

## 2017-08-30 NOTE — ED Triage Notes (Signed)
Patient has hx of PE post procedure and had knee scoped last week. Yesterday developed right calf swelling and shortness of breath. Sent by her MD to r/o PE. No CP, restarted on eliquis last Thursday. Alert and oriented, complains of nausea with same.

## 2017-08-30 NOTE — ED Notes (Signed)
Pt back from V/Q scan and XR.

## 2017-08-30 NOTE — ED Provider Notes (Signed)
[facility] EMERGENCY DEPARTMENT Provider Note   CSN: [id number] Arrival date & time: 08/30/17  1041     History   Chief Complaint Chief Complaint  Patient presents with  . calf swelling/SOB    HPI Wendy Marshall is a 62 y.o. female.  HPI Wendy Marshall is a 62 y.o. female with history of pulmonary embolism after surgical procedure in 2017, presents to emergency department with complaint of leg pain and shortness of breath.  Patient had right knee surgery on 08/23/17, for meniscal injury. Pt was started on eliquis post procedure for prophylaxis. Pt states her symptoms began yesterday. Feels similar to prior PE. Reports associated nausea. Pt has no other complaints. States worried she may have a PE.   Past Medical History:  Diagnosis Date  . Arthritis    "all over"  . Chondromalacia of knee, right 08/2017  . Complication of anesthesia    hard to wake up post-op  . Dental crowns present    x 2  . Difficulty swallowing pills    since cervical fusion  . Family history of adverse reaction to anesthesia    pt's mother has hx. of being hard to wake up post-op  . GERD (gastroesophageal reflux disease)    no current med.  Marland Kitchen Headache    x 3 days (08/18/2017)  . High cholesterol   . History of pulmonary embolus (PE) 03/09/2016   bilateral  . Hypothyroidism   . Limited joint range of motion (ROM)    cervical spine - s/p fusion  . Medial meniscus tear 08/2017   right knee  . Sleep apnea    uses CPAP nightly    Patient Active Problem List   Diagnosis Date Noted  . Acute medial meniscus tear of right knee 08/22/2017  . Vitamin D deficiency 02/20/2017  . Hyperlipidemia 02/20/2017  . OSA on CPAP 10/20/2016  . Nonspecific chest pain 10/20/2016  . Dyspnea 04/21/2016  . Hypersomnia with sleep apnea 04/11/2016  . Pulmonary embolism ([place]) 03/09/2016  . Leukocytosis 03/09/2016  . Fever 03/09/2016  . Status post total replacement of left hip 02/01/2016  .  Left-sided low back pain with left-sided sciatica 08/06/2015  . Hypothyroidism 08/05/2013  . Fibromyalgia 08/05/2013    Past Surgical History:  Procedure Laterality Date  . ANTERIOR CERVICAL DECOMP/DISCECTOMY FUSION  07/18/2002   C5-6  . CARPAL TUNNEL RELEASE Bilateral   . CHONDROPLASTY Right 08/23/2017   Procedure: CHONDROPLASTY;  Surgeon: Frederik Pear, MD;  Location: [facility];  Service: Orthopedics;  Laterality: Right;  . EYE MUSCLE SURGERY Bilateral   . KNEE ARTHROSCOPY Right 08/23/2017   Procedure: ARTHROSCOPY RIGHT KNEE REMOVAL OF LOOSE BODIES;  Surgeon: Frederik Pear, MD;  Location: [facility];  Service: Orthopedics;  Laterality: Right;  . KNEE ARTHROSCOPY WITH MEDIAL MENISECTOMY Right 08/23/2017   Procedure: KNEE ARTHROSCOPY WITH MEDIAL MENISECTOMY;  Surgeon: Frederik Pear, MD;  Location: [facility];  Service: Orthopedics;  Laterality: Right;  . SHOULDER ARTHROSCOPY Left 06/11/2001  . TONSILLECTOMY     age 10  . TOTAL HIP ARTHROPLASTY Left 02/01/2016   Procedure: LEFT TOTAL HIP ARTHROPLASTY ANTERIOR APPROACH;  Surgeon: Paralee Cancel, MD;  Location: [facility];  Service: Orthopedics;  Laterality: Left;  Failed Spinal to LMA  . TUBAL LIGATION    . WISDOM TOOTH EXTRACTION      OB History    No data available       Home Medications    Prior  to Admission medications   Medication Sig Start Date End Date Taking? Authorizing Provider  cholecalciferol (VITAMIN D) 1000 units tablet Take 2,000 Units by mouth daily.    [provider]  HYDROcodone-acetaminophen (NORCO) 5-325 MG tablet Take 1 tablet by mouth every 4 (four) hours as needed. 08/23/17   Leighton Parody, PA-C  levothyroxine (SYNTHROID, LEVOTHROID) 100 MCG tablet Take 1 tablet (100 mcg total) by mouth daily. 02/17/17   Nilda Simmer, NP  MAGNESIUM PO Take by mouth.    [provider]  Multiple Vitamin (MULTIVITAMIN) tablet Take 1 tablet by mouth daily.    [provider]  naproxen (NAPROSYN) 500 MG tablet Take 500 mg by mouth 2 (two) times daily with a meal.    [provider]  rosuvastatin (CRESTOR) 5 MG tablet TAKE 1 TABLET BY MOUTH ONCE DAILY 07/17/17   Nilda Simmer, NP  TURMERIC PO Take by mouth.    [provider]  vitamin C (ASCORBIC ACID) 500 MG tablet Take 500 mg by mouth daily.    [provider]    Family History Family History  Problem Relation Age of Onset  . Hypertension Mother   . Anesthesia problems Mother        hard to wake up post-op  . Brain cancer Father   . Colon cancer Other   . Heart attack Other   . Throat cancer Other     Social History Social History   Tobacco Use  . Smoking status: Former Smoker    Packs/day: 0.00    Years: 11.00    Pack years: 0.00    Last attempt to quit: 07/31/1996    Years since quitting: 21.0  . Smokeless tobacco: Never Used  Substance Use Topics  . Alcohol use: Yes    Comment: rarely  . Drug use: No     Allergies   Penicillins; Adhesive [tape]; Contrast media [iodinated diagnostic agents]; Flexeril [cyclobenzaprine]; Omnipaque [iohexol]; and Soma [carisoprodol]   Review of Systems Review of Systems  Constitutional: Negative for chills and fever.  Respiratory: Positive for chest tightness and shortness of breath. Negative for cough.   Cardiovascular: Negative for chest pain, palpitations and leg swelling.  Gastrointestinal: Negative for abdominal pain, diarrhea, nausea and vomiting.  Genitourinary: Negative for dysuria, flank pain and pelvic pain.  Musculoskeletal: Positive for arthralgias and joint swelling. Negative for myalgias, neck pain and neck stiffness.  Skin: Negative for rash.  Neurological: Negative for dizziness, weakness and headaches.  All other systems reviewed and are negative.    Physical Exam Updated Vital Signs BP (!) 148/72   Pulse (!) 108   Temp 98.4 F (36.9 C) (Oral)   Resp 18   Ht 5\' 4"  (1.626 m)   Wt  93.9 kg (207 lb)   SpO2 100%   BMI 35.53 kg/m   Physical Exam  Constitutional: She appears well-developed and well-nourished. No distress.  HENT:  Head: Normocephalic.  Eyes: Conjunctivae are normal.  Neck: Neck supple.  Cardiovascular: Normal rate, regular rhythm and normal heart sounds.  Pulmonary/Chest: Effort normal and breath sounds normal. No respiratory distress. She has no wheezes. She has no rales.  Abdominal: Soft. Bowel sounds are normal. She exhibits no distension. There is no tenderness. There is no rebound.  Musculoskeletal: She exhibits no edema.  Mild swelling noted to the right knee.  No erythema, bruising.  There is swelling extending down the calf.  Calf appears to be tight, however compartments are soft.  Tenderness to palpation diffusely over the calf.  Dorsal pedal pulses intact.  Neurological: She is alert.  Skin: Skin is warm and dry.  Psychiatric: She has a normal mood and affect. Her behavior is normal.  Nursing note and vitals reviewed.    ED Treatments / Results  Labs (all labs ordered are listed, but only abnormal results are displayed) Labs Reviewed  BASIC METABOLIC PANEL  CBC  PROTIME-INR  I-STAT TROPONIN, ED    EKG  EKG Interpretation  Date/Time:  Wednesday August 30 2017 10:46:46 EST Ventricular Rate:  110 PR Interval:  144 QRS Duration: 70 QT Interval:  314 QTC Calculation: 424 R Axis:   100 Text Interpretation:  Sinus tachycardia Rightward axis T wave abnormality, consider inferior ischemia Abnormal ECG SINCE LAST TRACING HEART RATE HAS INCREASED Confirmed by Malvin Johns [phone]) on 08/30/2017 2:08:33 PM       Radiology Dg Chest 2 View  Result Date: 08/30/2017 CLINICAL DATA:  Right calf swelling and shortness of breath after recent knee arthroscopy. History of pulmonary embolus. EXAM: CHEST  2 VIEW COMPARISON:  10/20/2016. FINDINGS: Trachea is midline. Heart size stable. Lungs are clear. No pleural fluid. IMPRESSION: No acute  findings. Electronically Signed   By: Lorin Picket M.D.   On: 08/30/2017 13:50   Nm Pulmonary Vent And Perf (v/q Scan)  Result Date: 08/30/2017 CLINICAL DATA:  Recent knee surgery with right calf swelling and shortness of breath. EXAM: NUCLEAR MEDICINE VENTILATION - PERFUSION LUNG SCAN TECHNIQUE: Ventilation images were obtained in multiple projections using inhaled aerosol Tc-19m DTPA. Perfusion images were obtained in multiple projections after intravenous injection of Tc-46m MAA. RADIOPHARMACEUTICALS:  32.0 mCi of Tc-6m DTPA aerosol inhalation and 4.1 mCi Tc58m MAA-IV COMPARISON:  Chest x-ray 08/30/2017 FINDINGS: Ventilation: No focal ventilation defect. Perfusion: No wedge shaped peripheral perfusion defects to suggest acute pulmonary embolism. IMPRESSION: Negative for pulmonary embolism. Electronically Signed   By: Marijo Sanes M.D.   On: 08/30/2017 13:37    Procedures Procedures (including critical care time)  Medications Ordered in ED Medications - No data to display   Initial Impression / Assessment and Plan / ED Course  I have reviewed the triage vital signs and the nursing notes.  Pertinent labs & imaging results that were available during my care of the patient were reviewed by me and considered in my medical decision making (see chart for details).    Patient with increased leg swelling, arthroscopic surgery to the right knee 1 week ago.  Swelling got worse yesterday.  Patient states she has been on her leg and walking around.  Also reports mild shortness of breath onset yesterday.  History of PE after a surgery in 2017, although she states this does not feel similar, she is concerned she may be.  She does have IV dye allergy, will get VQ scan and venous duplex of the leg.  Blood work including troponin and EKG ordered as well.   Patient's VQ scan and venous duplex both negative.  Negative troponin, negative chest x-ray.  Patient reassured.  She feels much better.  Advised to  continue to keep her leg elevated to reduce the swelling in that knee in the calf.  We will have her follow-up with her orthopedic specialist.  At this time no signs of leg infection, no concern for compartment syndrome, no DVT, patient stable for discharge home.  Return precautions discussed.  She will continue on Eliquis  Vitals:   08/30/17 1315 08/30/17 1330 08/30/17 1400 08/30/17 1422  BP: 124/80   114/83  Pulse: 84 88 91 88  Resp: 13 15 17 15   Temp:      TempSrc:      SpO2: 96% 100% 98% 96%  Weight:      Height:         Final Clinical Impressions(s) / ED Diagnoses   Final diagnoses:  Right calf pain  Shortness of breath    ED Discharge Orders    None       Janee Morn 08/30/17 2033    Tegeler, Gwenyth Allegra, MD 08/30/17 2212

## 2017-08-30 NOTE — Discharge Instructions (Signed)
Keep leg elevated. Stay off of leg

## 2017-08-30 NOTE — ED Notes (Signed)
Pt stable, ambulatory, and verbalizes understanding of d/c instructions.  

## 2017-08-30 NOTE — ED Notes (Signed)
Pt transported to VQ scan. 

## 2017-08-30 NOTE — Progress Notes (Signed)
RLE venous duplex prelim: negative for DVT.  Kirstine Jacquin Eunice, RDMS, RVT  

## 2017-09-18 IMAGING — US US EXTREM LOW VENOUS*L*
1 series · 14 of 24 positions shown · non-contrast
Comparison: None.

CLINICAL DATA: Left lower extremity pain and swelling.

EXAM:
LEFT LOWER EXTREMITY VENOUS DUPLEX ULTRASOUND
TECHNIQUE: Doppler venous assessment of the left lower extremity deep venous
system was performed, including characterization of spectral flow,
compressibility, and phasicity.

[Series 1: us extrem low venous*left* · 0.09mm/px · 14 of 34 slices shown]
[im 1/34]
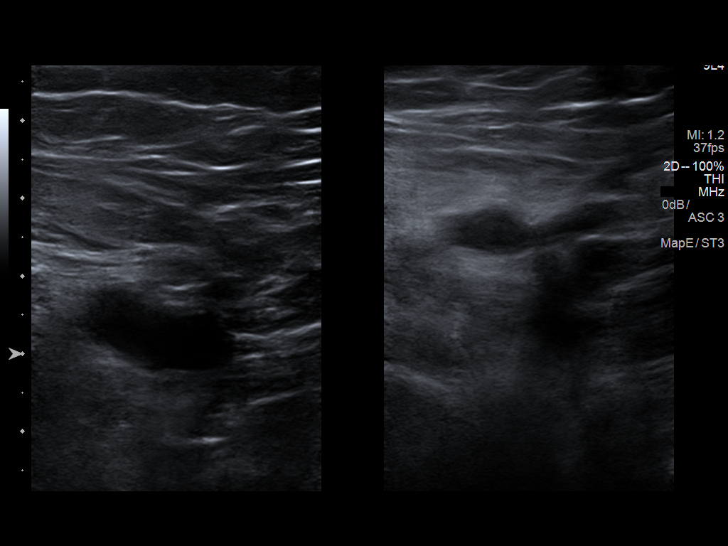
[im 3/34]
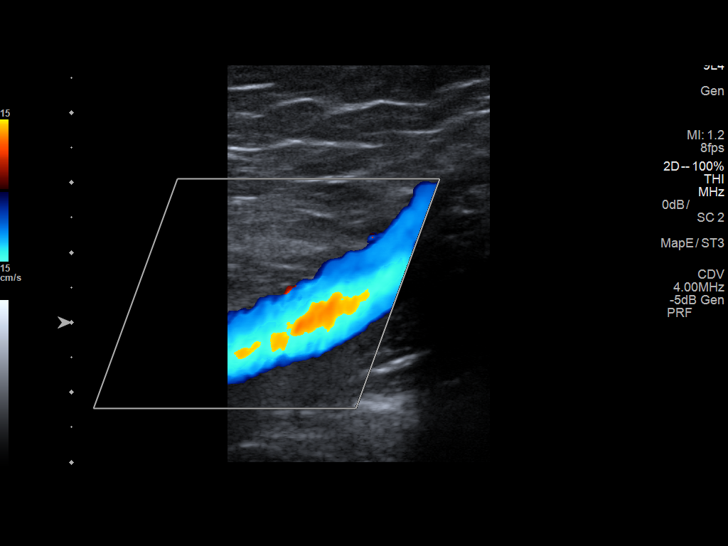
[im 6/34]
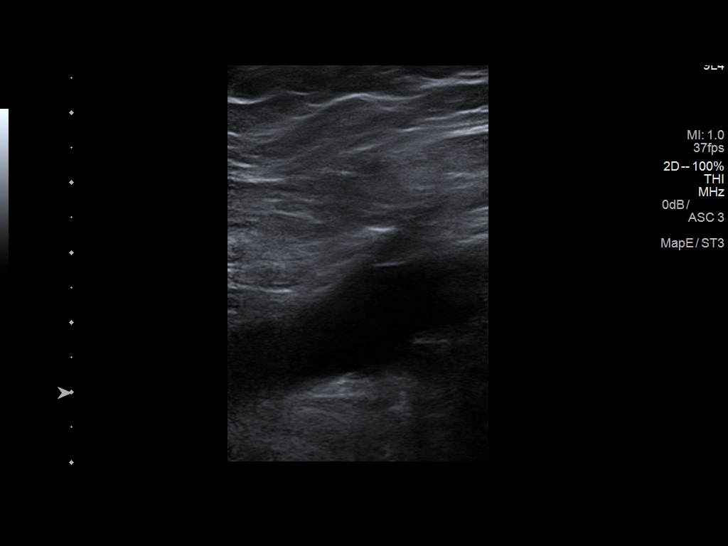
[im 9/34]
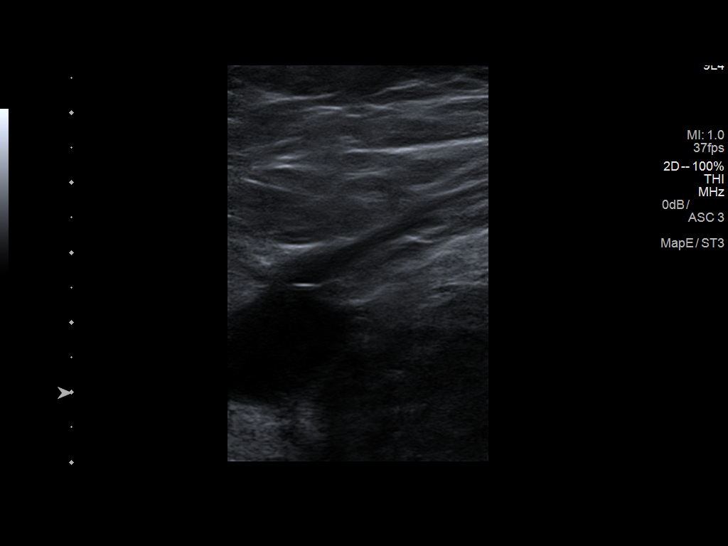
[im 11/34]
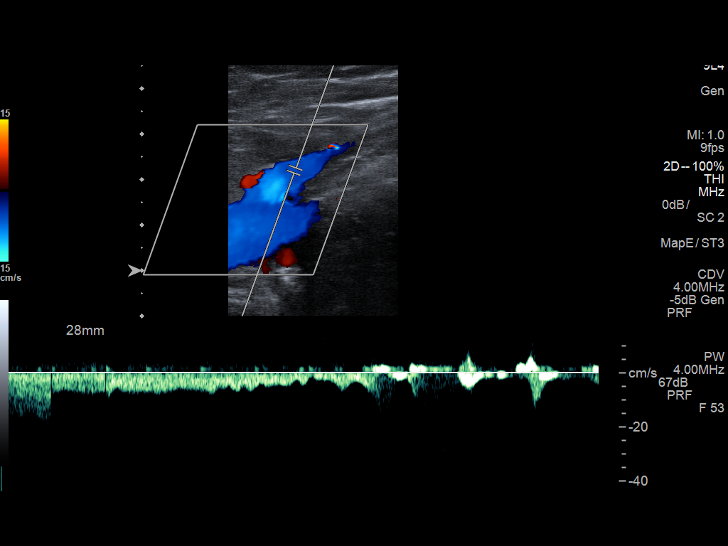
[im 13/34]
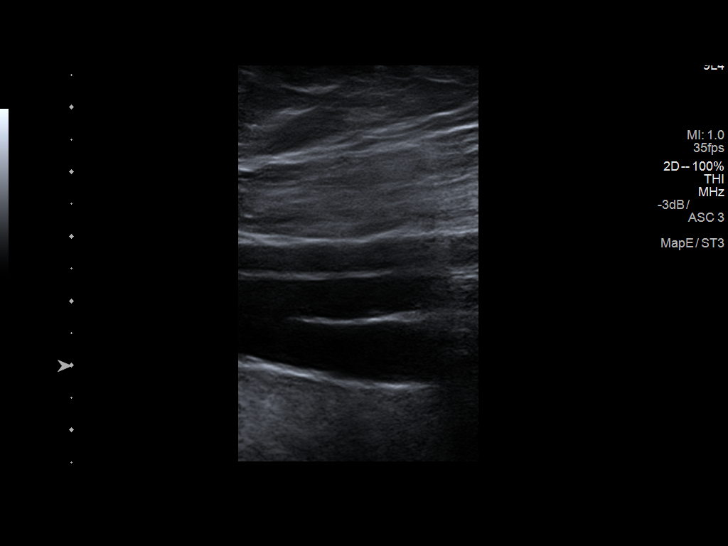
[im 16/34]
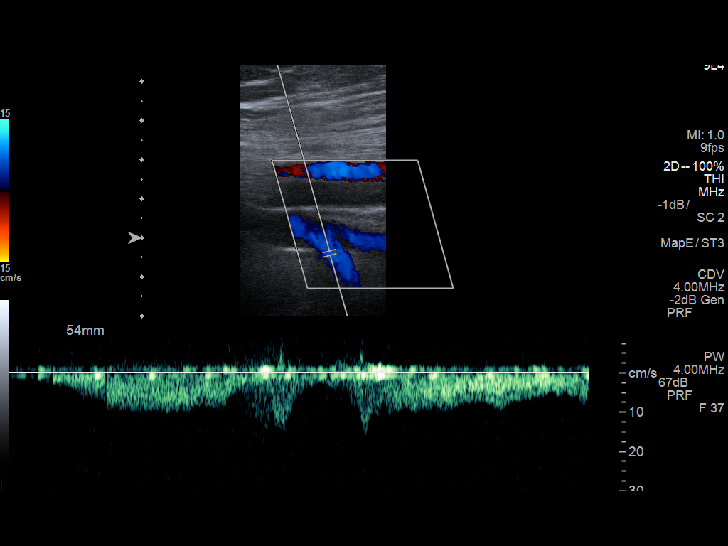
[im 18/34]
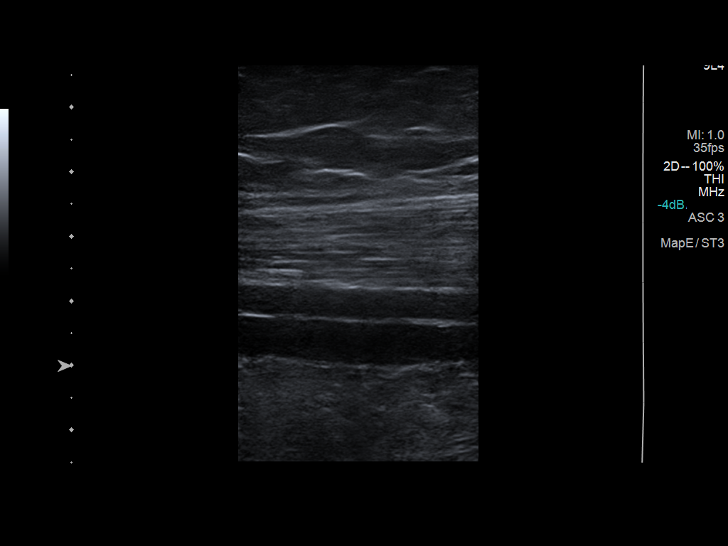
[im 21/34]
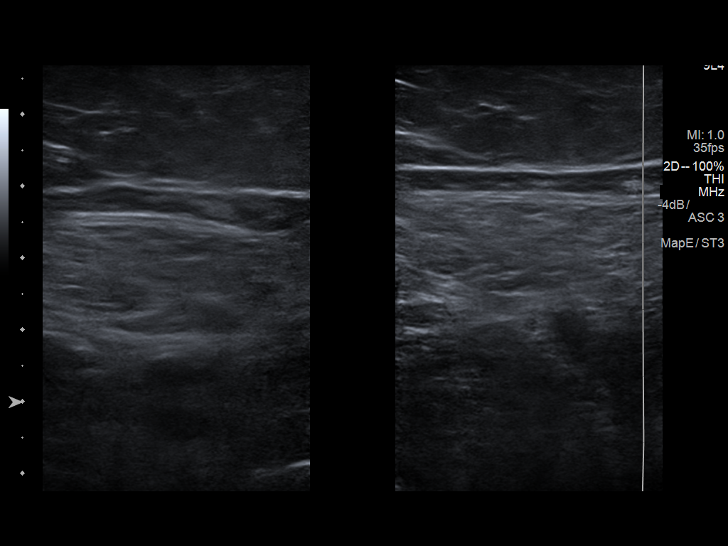
[im 23/34]
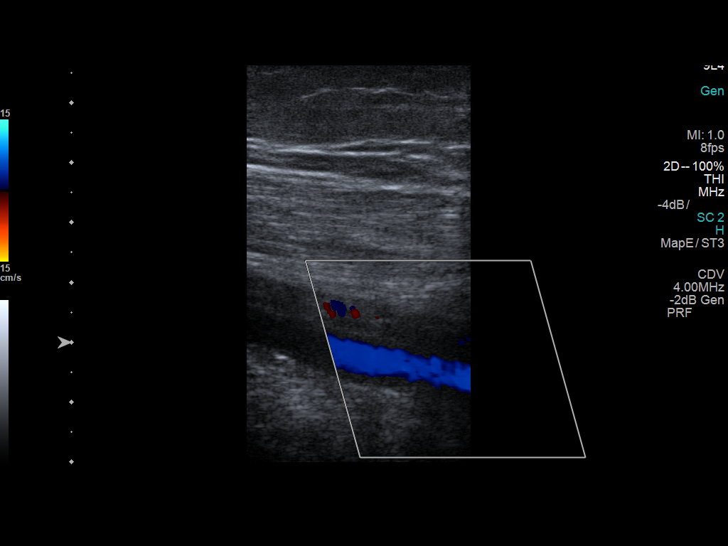
[im 26/34]
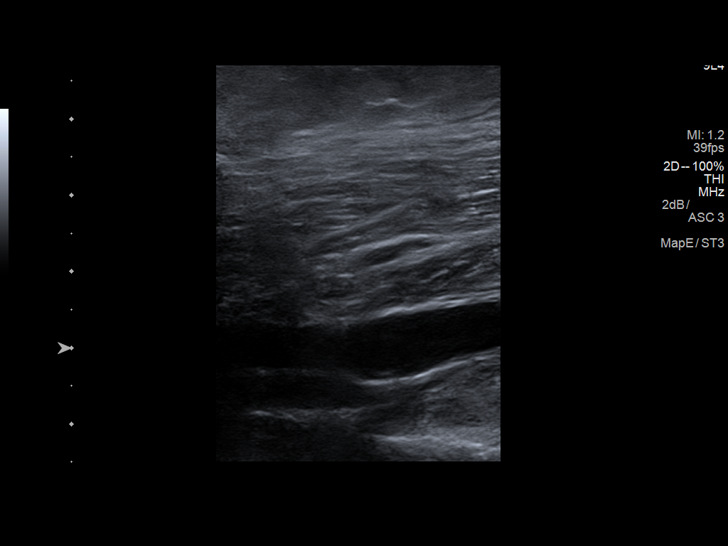
[im 28/34]
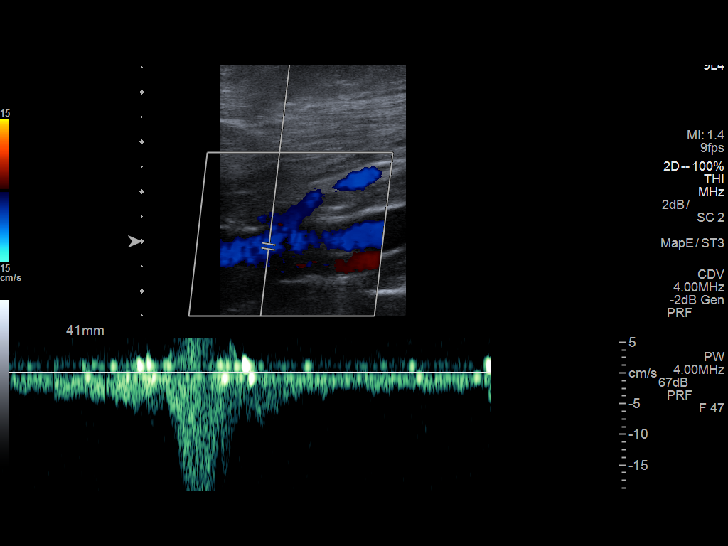
[im 31/34]
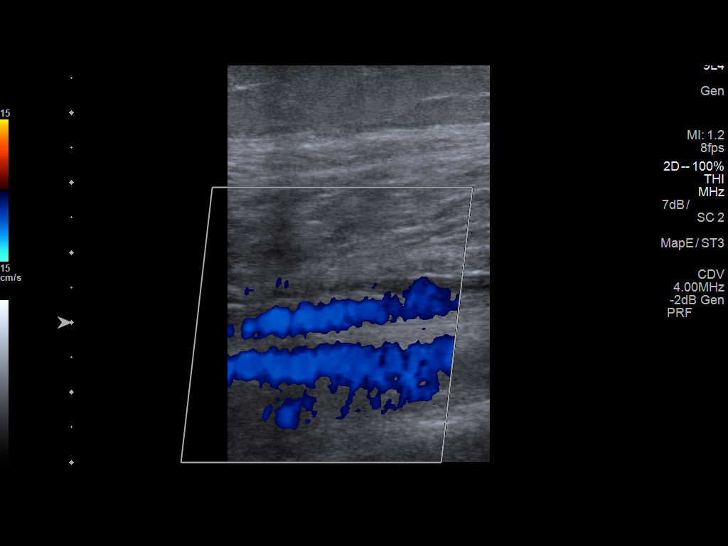
[im 34/34]
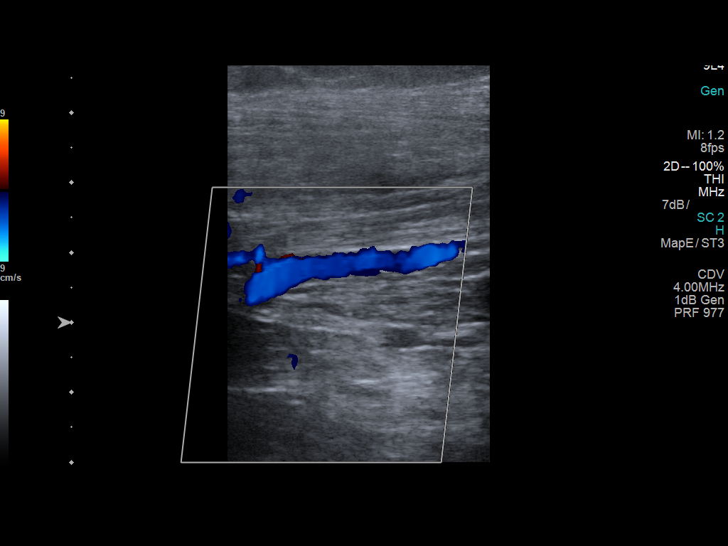

[14 of 24 positions shown; findings below may reference images not displayed]

FINDINGS: There is complete compressibility of the common femoral, femoral,
and popliteal veins. Doppler analysis demonstrates respiratory
phasicity and augmentation of flow with calf compression. No obvious
superficial vein or calf vein thrombosis.
IMPRESSION: No evidence of DVT.

## 2018-04-06 ENCOUNTER — Encounter (HOSPITAL_COMMUNITY): Payer: Self-pay | Admitting: Physical Therapy

## 2018-04-06 NOTE — Therapy (Signed)
[facility] [place], [place], [address] Phone: [phone]   Fax:  [phone]  Patient Details  Name: Wendy Marshall MRN: [id number] Date of Birth: [birth year] Referring Provider:  No ref. provider found  Encounter Date: 04/06/2018 PHYSICAL THERAPY DISCHARGE SUMMARY  Visits from Start of Care:9  Current functional level related to goals / functional outcomes: Unknown as pt did not return for treatment   Remaining deficits: unknown   Education / Equipment: HEP Plan: Patient agrees to discharge.  Patient goals were partially met. Patient is being discharged due to not returning since the last visit.  ?????       Rayetta Humphrey, PT CLT [phone] 04/06/2018, 2:11 PM  [facility] [address] [place], [place], [address] Phone: [phone]   Fax:  [phone]

## 2018-04-08 IMAGING — CR DG CHEST 2V
2 series · 2 of 2 positions shown · non-contrast
Comparison: 03/09/2016

CLINICAL DATA: History of PE with increased shortness of Breath

EXAM:
CHEST  2 VIEW

[w chest pa]
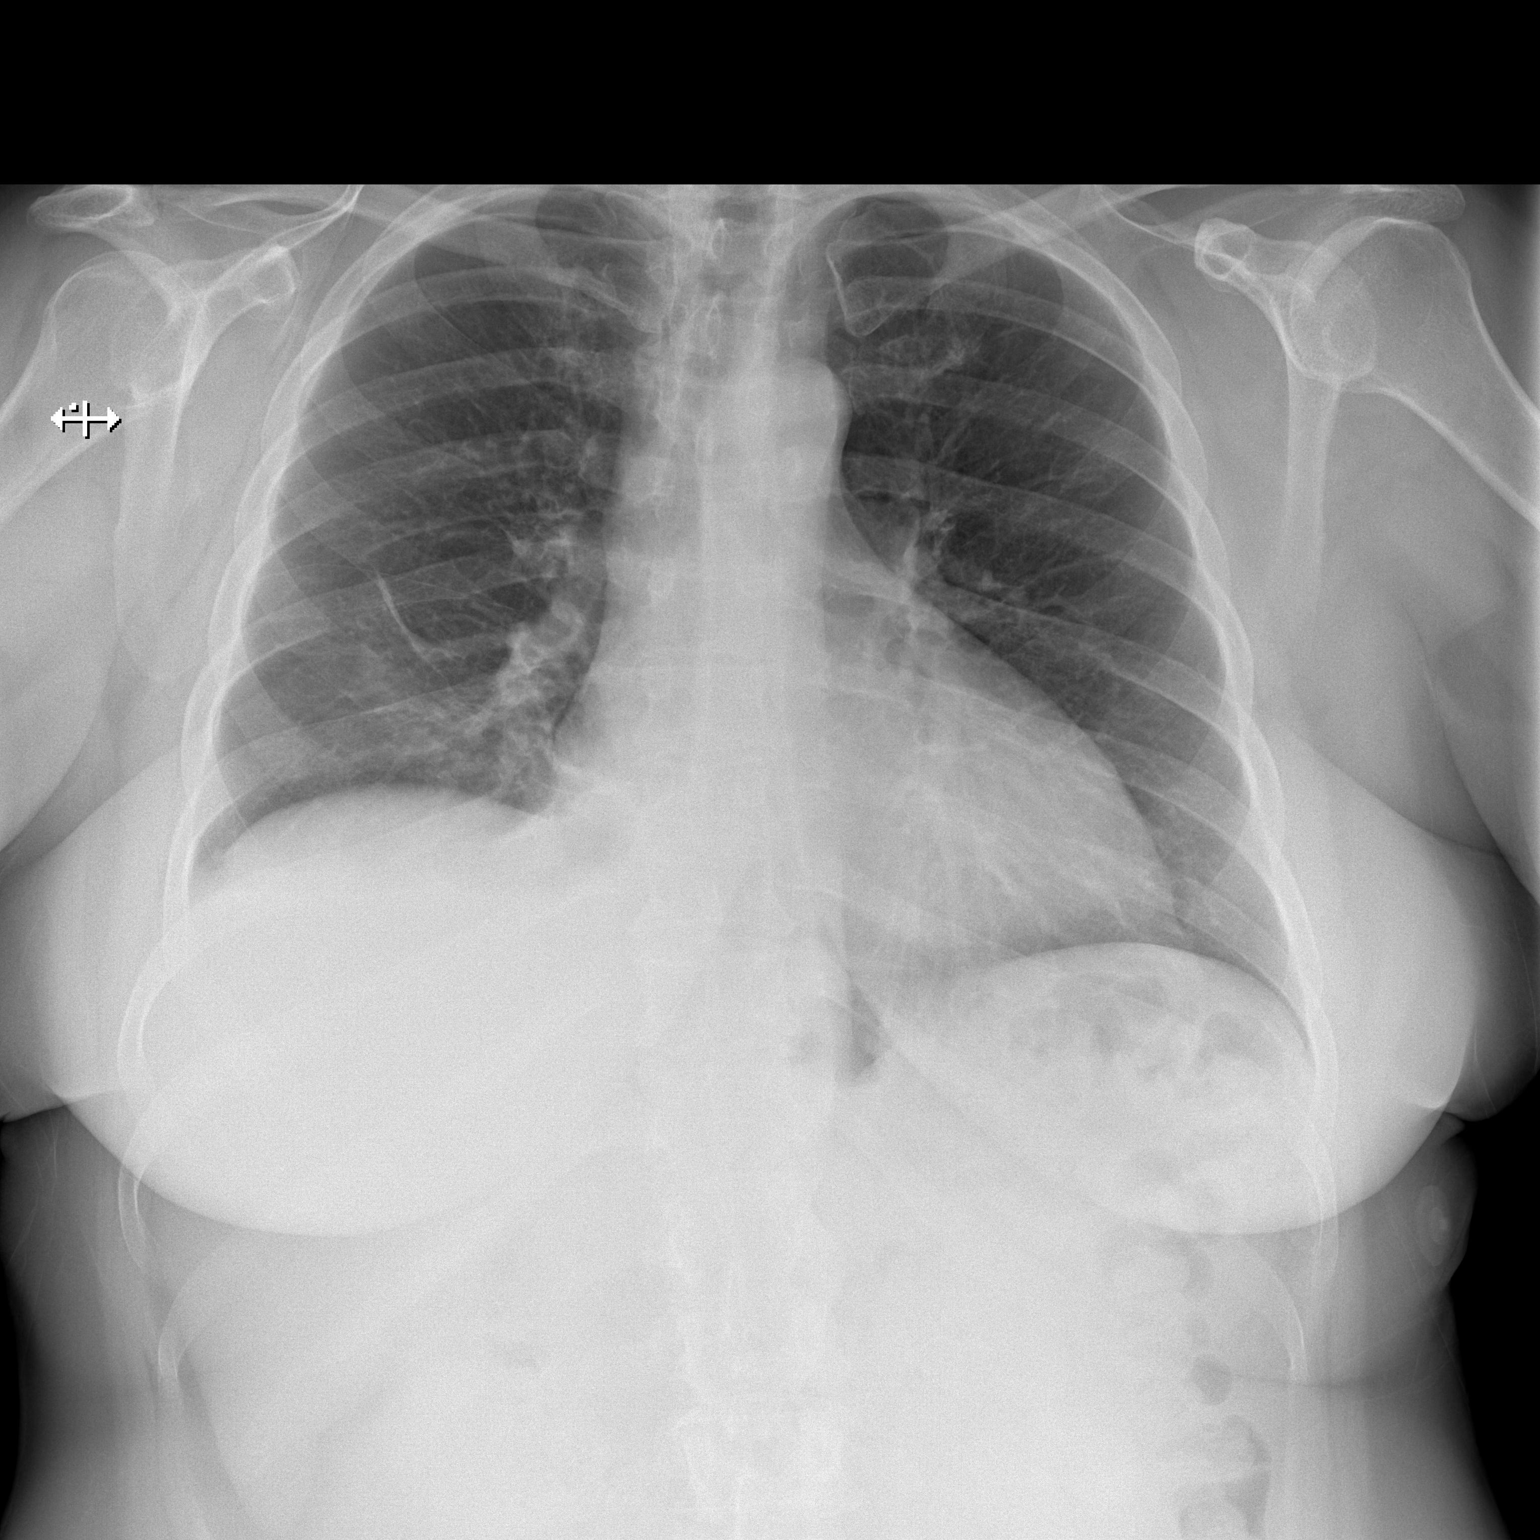

[w chest lat]
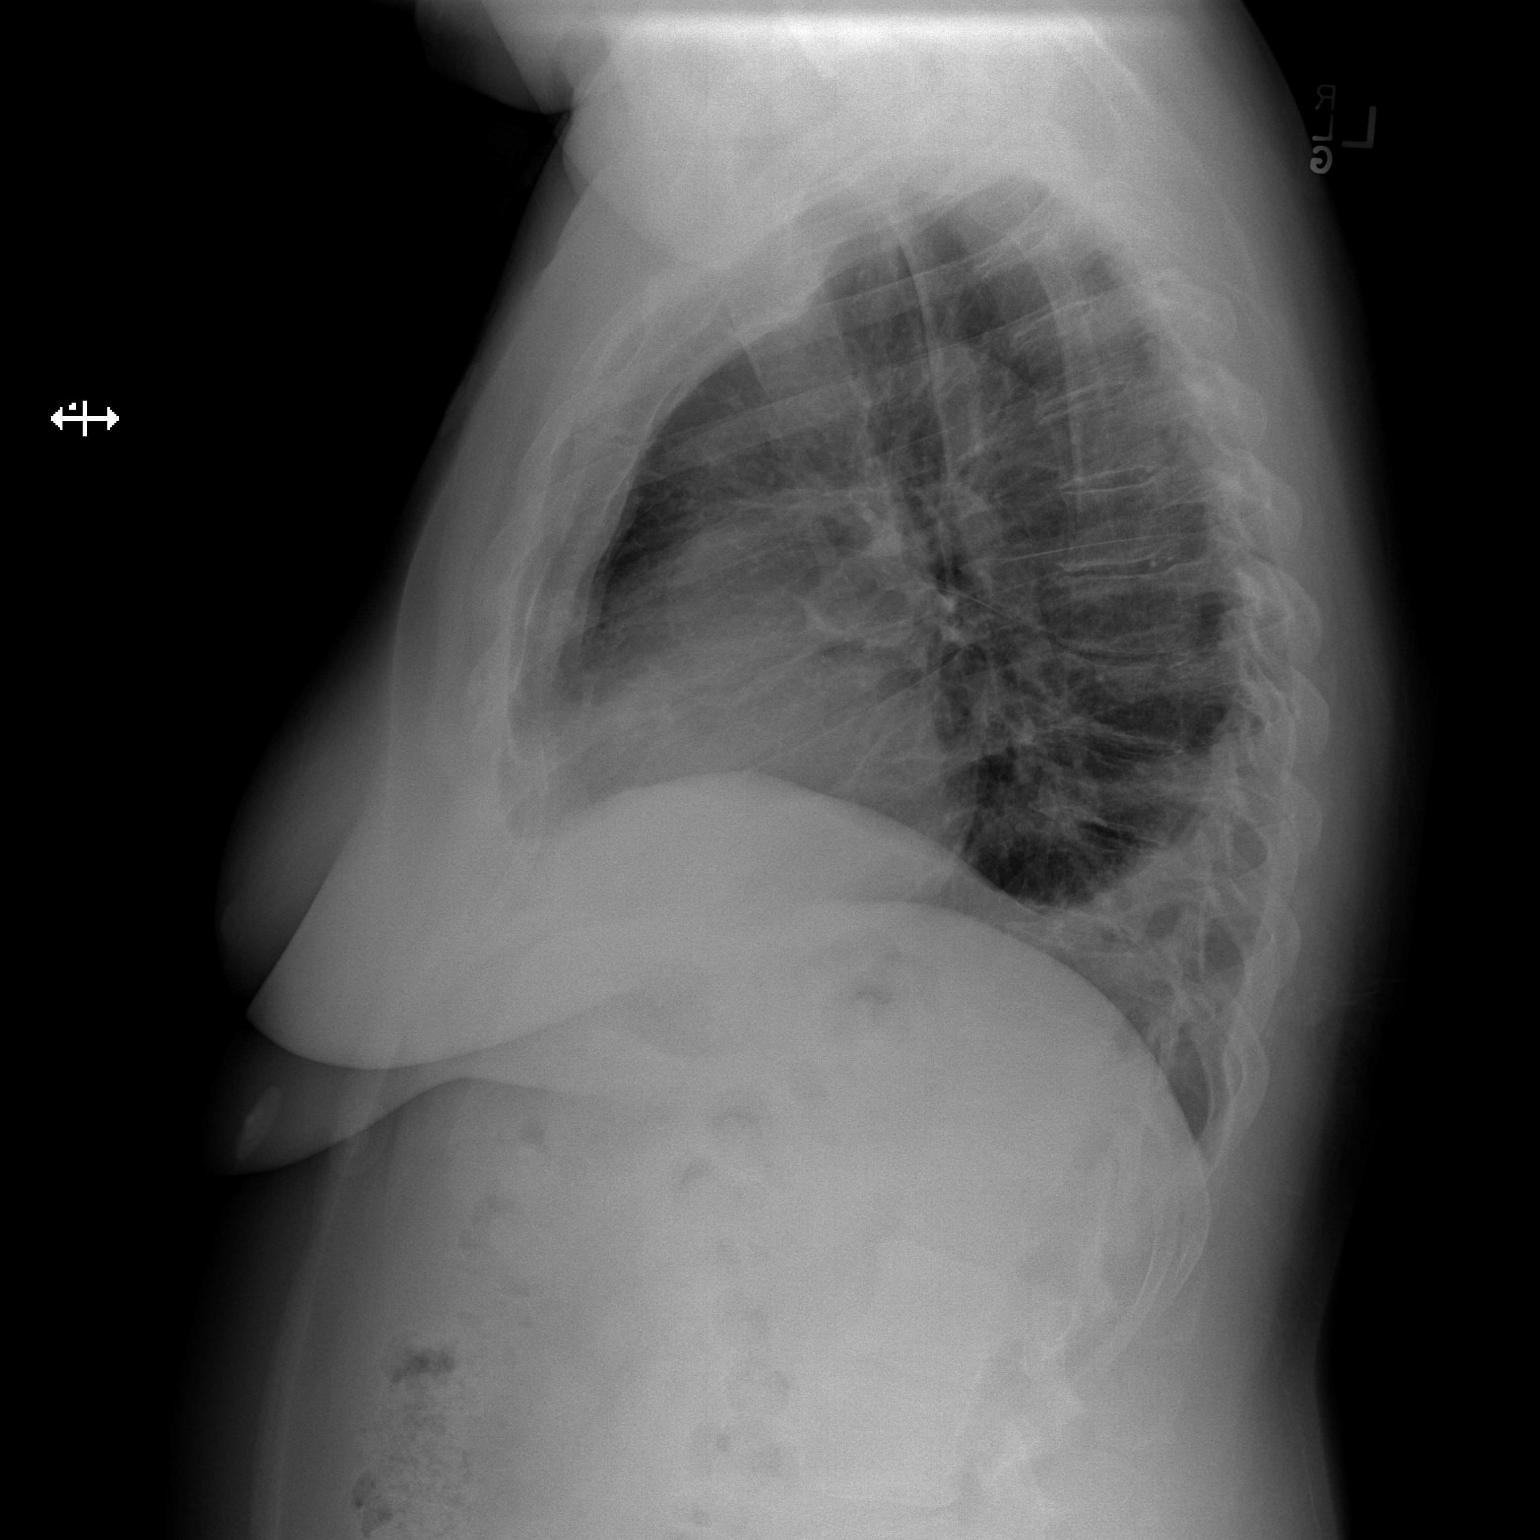

[2 of 2 positions shown; findings below may reference images not displayed]

FINDINGS: Cardiac shadow is stable. The lungs are well aerated bilaterally
with mild right basilar atelectasis slightly increased from the
prior exam. A small right pleural effusion is also noted
posteriorly. This has increased from the recent CT examination. No
bony abnormality is noted.
IMPRESSION: Slight increase in right basilar effusion and atelectasis

## 2018-04-13 IMAGING — DX DG CHEST 2V
2 series · 2 of 2 positions shown · non-contrast
Comparison: 03/16/2016 CT of 03/16/2016.

CLINICAL DATA: Short of breath with left-sided chest pain for 2
weeks.

EXAM:
CHEST  2 VIEW

[chest pa]
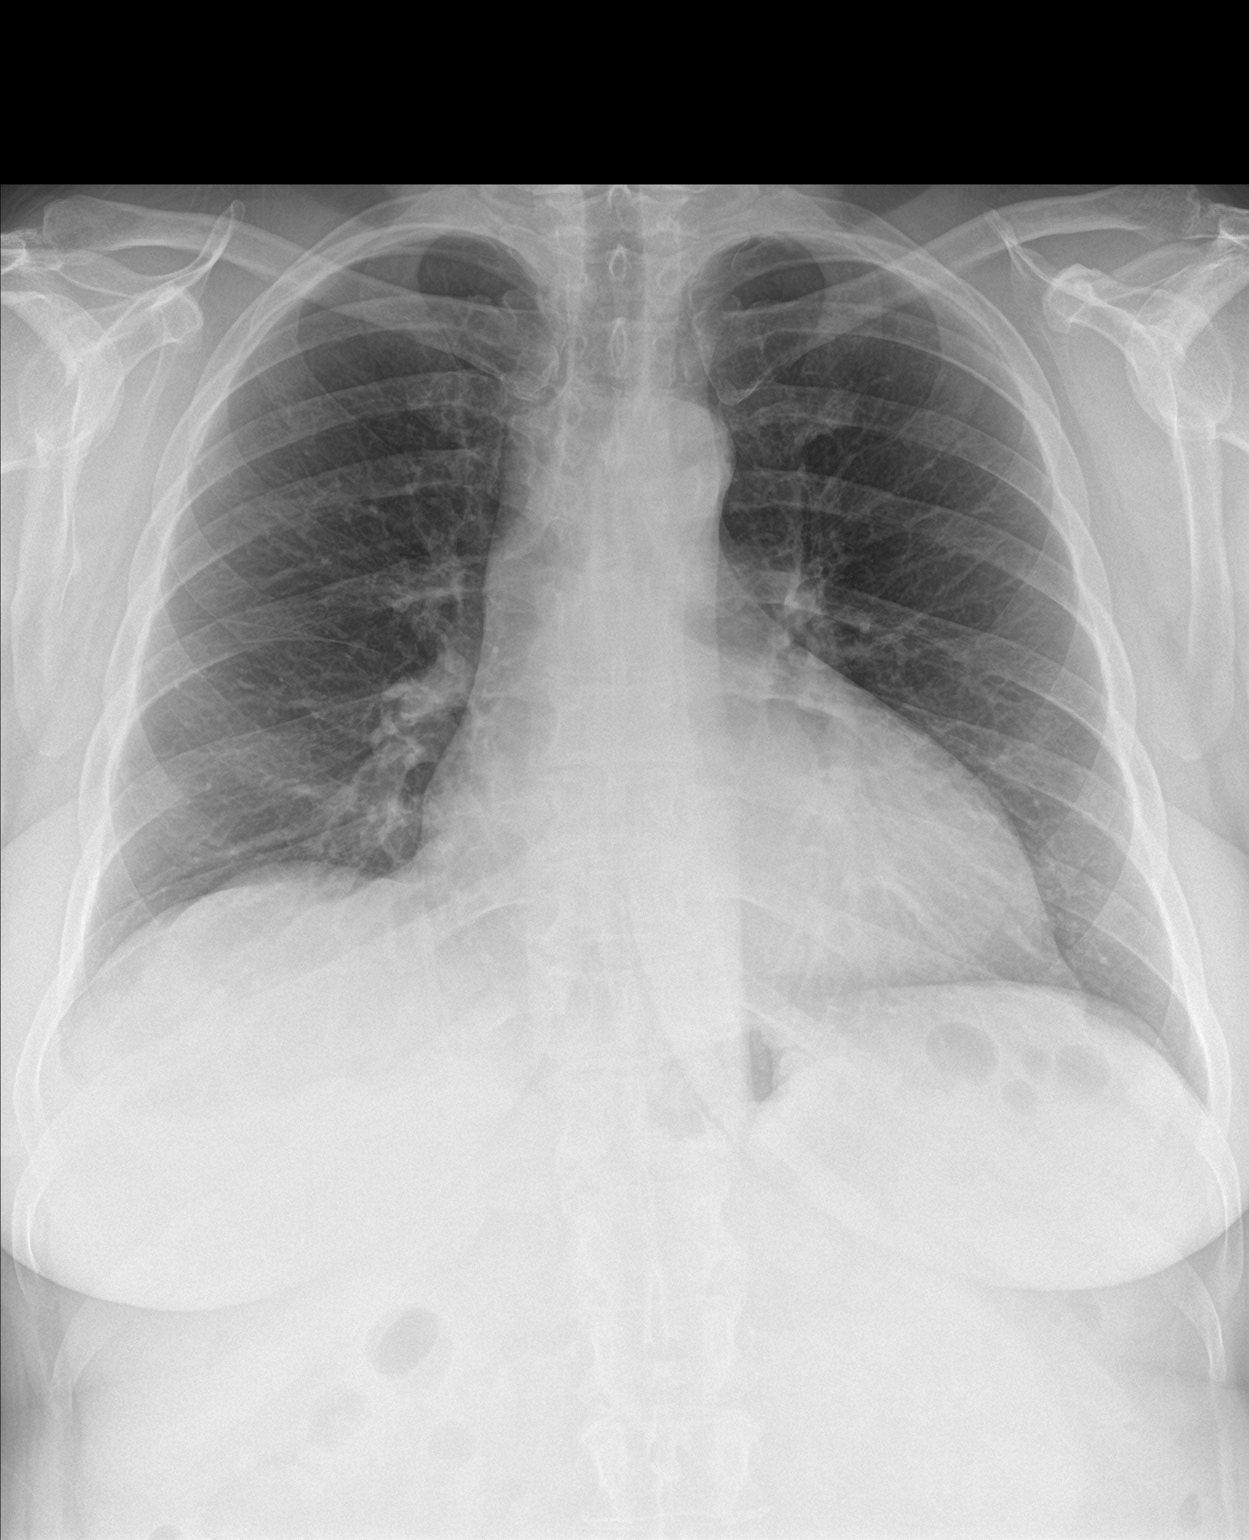

[chest lat]
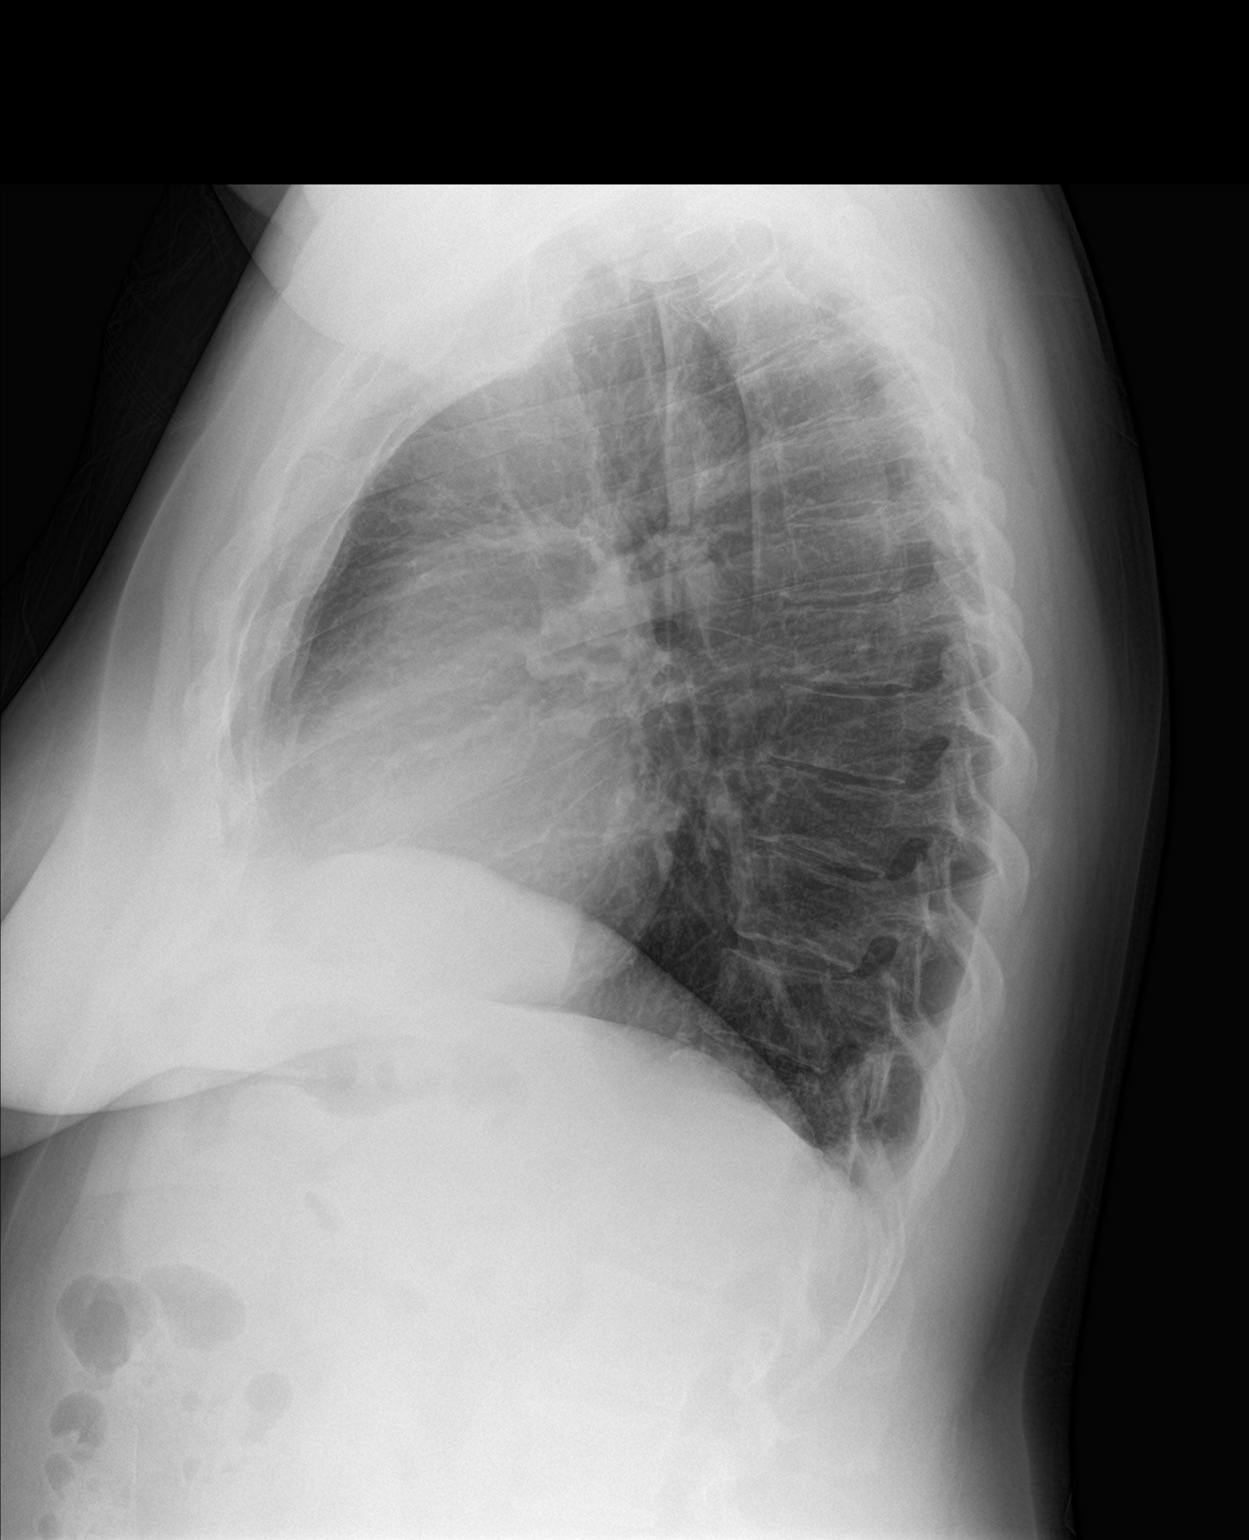

[2 of 2 positions shown; findings below may reference images not displayed]

FINDINGS: Prior cervical spine fixation. Midline trachea. Mild cardiomegaly.
Mediastinal contours otherwise within normal limits. Mild right
hemidiaphragm elevation. Nearly completely resolved tiny right
pleural effusion. No pneumothorax. No congestive failure. Clear
lungs.
IMPRESSION: Improved right base aeration with trace right pleural fluid
remaining.

Mild cardiomegaly, without congestive failure.

## 2018-06-04 ENCOUNTER — Other Ambulatory Visit: Payer: Self-pay | Admitting: Family Medicine

## 2018-06-04 DIAGNOSIS — E038 Other specified hypothyroidism: Secondary | ICD-10-CM

## 2018-06-04 NOTE — Telephone Encounter (Signed)
Patient is aware of all. Labs placed.

## 2018-06-04 NOTE — Telephone Encounter (Signed)
TSH 

## 2018-06-04 NOTE — Telephone Encounter (Signed)
Please advise. Thank you

## 2018-06-04 NOTE — Telephone Encounter (Signed)
Pt has appt set to see Wendy Marshall Comment on 06/11/18. She would like to have her thyroid levels checked before appt on Monday. She will be self pay so she is requesting only the thyroid lab be ordered vs the normal labs that are ordered. Also she is out of the medication levothyroxine (SYNTHROID, LEVOTHROID) 100 MCG tablet. Please send to [facility], [place] [address]

## 2018-06-06 LAB — TSH: TSH: 3.02 u[IU]/mL (ref 0.450–4.500)

## 2018-06-11 ENCOUNTER — Ambulatory Visit: Payer: Self-pay | Admitting: Family Medicine

## 2018-06-11 ENCOUNTER — Encounter: Payer: Self-pay | Admitting: Family Medicine

## 2018-06-11 VITALS — BP 124/82 | Ht 64.0 in | Wt 214.0 lb

## 2018-06-11 DIAGNOSIS — E039 Hypothyroidism, unspecified: Secondary | ICD-10-CM

## 2018-06-11 MED ORDER — LEVOTHYROXINE SODIUM 112 MCG PO TABS: 112.0000 ug | ORAL_TABLET | Freq: Every day | ORAL | 1 refills | Status: DC

## 2018-06-11 NOTE — Progress Notes (Signed)
   Subjective:    Patient ID: Wendy Marshall, female    DOB: Oct 09, 1955, 62 y.o.   MRN: [id number]  HPI   Hypothyroid. Pt states she has been out of levothyroxine for over one week. Had been out for 1 day prior to getting TSH level checked on 06/05/18. Has gained some weight over the last week, with fatigue and feeling down. Occasionally has difficulty swallowing, not painful, no problems with food getting stuck.  States she has not had the money to get her other screening bloodwork done. Does not have insurance. 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   Martin Majestic out of work a year ago for her knee pain. Pt states she needs a right hip replacement also and her hand have been numb.  Has been seen by Ortho for these things. Wants to try to file for disability. Cant stand too long with knee and can't sit too long with hip.     Review of Systems  Constitutional: Positive for fatigue.  Cardiovascular: Negative for chest pain and palpitations.  Psychiatric/Behavioral: Positive for dysphoric mood. Negative for suicidal ideas.       Objective:   Physical Exam  Constitutional: She is oriented to person, place, and time. She appears well-developed and well-nourished. No distress.  HENT:  Head: Normocephalic and atraumatic.  Eyes: Right eye exhibits no discharge. Left eye exhibits no discharge.  Neck: Neck supple. No thyroid mass and no thyromegaly present.  Cardiovascular: Normal rate, regular rhythm and normal heart sounds.  No murmur heard. Pulmonary/Chest: Effort normal and breath sounds normal. No respiratory distress.  Neurological: She is alert and oriented to person, place, and time.  Skin: Skin is warm and dry.  Psychiatric: She has a normal mood and affect.  Nursing note and vitals reviewed.      Assessment & Plan:  Hypothyroidism, unspecified type - Plan: TSH, T4, free TSH from 06/05/18 is normal, but on the higher end. Will increase levothyroxine dose to 112  mcg daily. Recommend f/u in 6 months and repeat TSH and free T4 in 2-3 months. Pt states she can afford medication at this time. She will notify us when she is able to afford or when she has insurance to obtain other screening labs and physical. States she thinks she will have insurance the beginning of next year. Instructed if her difficulty swallowing becomes a more frequent issue or if she has significant pain or food is getting stuck then further evaluation is warranted. Also discussed importance for her to seek emergency help if she develops thoughts of SI/HI, pt verbalized understanding. She is not suicidal today, and states moods are typically better when she is on her thyroid medication.   Dr. Mickie Hillier was consulted on this case and is in agreement with the above treatment plan.

## 2018-08-13 ENCOUNTER — Other Ambulatory Visit (HOSPITAL_COMMUNITY): Payer: Self-pay | Admitting: Family Medicine

## 2018-08-13 ENCOUNTER — Telehealth: Payer: Self-pay | Admitting: Family Medicine

## 2018-08-13 ENCOUNTER — Other Ambulatory Visit: Payer: Self-pay | Admitting: Family Medicine

## 2018-08-13 DIAGNOSIS — Z1231 Encounter for screening mammogram for malignant neoplasm of breast: Secondary | ICD-10-CM

## 2018-08-13 NOTE — Telephone Encounter (Signed)
I rec OV with myself or Ria Comment within next 2 days and we can examine and order the tests asap

## 2018-08-13 NOTE — Telephone Encounter (Signed)
Pt thinks she has a cyst in her left breast & would like to get a diagnostic mammogram.  States she's had a cyst before (years ago)  Pt noticed it around [place]? Or can we order diagnostic mammo?    Pt's anxious and would like this done soon   Please advise & call pt [phone]

## 2018-08-13 NOTE — Telephone Encounter (Signed)
Patient notified and scheduled office tomorrow with Ria Comment for evaluation.

## 2018-08-13 NOTE — Telephone Encounter (Signed)
Per pt the cyst is right under the skin. Some tenderness,not raised or hot to touch, no fevers.Please advise.

## 2018-08-14 ENCOUNTER — Encounter: Payer: Self-pay | Admitting: Family Medicine

## 2018-08-14 ENCOUNTER — Ambulatory Visit (INDEPENDENT_AMBULATORY_CARE_PROVIDER_SITE_OTHER): Payer: BLUE CROSS/BLUE SHIELD | Admitting: Family Medicine

## 2018-08-14 VITALS — BP 112/88 | Temp 99.1°F | Ht 64.0 in | Wt 215.0 lb

## 2018-08-14 DIAGNOSIS — N644 Mastodynia: Secondary | ICD-10-CM

## 2018-08-14 NOTE — Progress Notes (Signed)
   Subjective:    Patient ID: Wendy Marshall, female    DOB: [birth year], 63 y.o.   MRN: [id number]  HPI Patient is here today with complaints of a mass she has found in her left breast. She states she discovered the mass on July 27, 2018. She states both of her breast are tender,but the left one felt odd. She has had a history of a duct cyst on the same breast in 2006. She state she had nothing done for it,they told her it would go away and it did. She did have a diagnotic mammogram which found it. Her last mammogram was around 2013. She states this is concerning as she has had several chest x ray, and exposure to radiation.  Has stayed about the same. Reports both breasts are tender now.   Reports great-aunt with hx of breast cancer.   Review of Systems  Constitutional: Negative for fever and unexpected weight change.  Skin: Negative for color change.       Objective:   Physical Exam Vitals signs and nursing note reviewed.  Constitutional:      General: She is not in acute distress.    Appearance: She is well-developed.  HENT:     Head: Normocephalic and atraumatic.  Neck:     Musculoskeletal: Neck supple.  Cardiovascular:     Rate and Rhythm: Normal rate and regular rhythm.     Heart sounds: Normal heart sounds. No murmur.  Pulmonary:     Effort: Pulmonary effort is normal. No respiratory distress.     Breath sounds: Normal breath sounds.  Chest:     Breasts: Breasts are symmetrical.        Right: Tenderness present. No inverted nipple, mass, nipple discharge or skin change.        Left: Tenderness present. No inverted nipple, mass, nipple discharge or skin change.     Comments: Mild tenderness to right breast. More significant tenderness to left breast on palpation localized to area behind the nipple. No discrete mass palpated.  Lymphadenopathy:     Upper Body:     Right upper body: No supraclavicular or axillary adenopathy.     Left upper body: No supraclavicular or  axillary adenopathy.  Skin:    General: Skin is warm and dry.  Neurological:     Mental Status: She is alert and oriented to person, place, and time.           Assessment & Plan:  Pain of left breast - Plan: MM DIAG BREAST TOMO BILATERAL, US BREAST LTD UNI LEFT INC AXILLA  Will order diagnostic mammogram with ultrasound, will notify pt of results. Recommend f/u in 1 month to reassess. Also recommend wellness physical.   Dr. Sallee Lange was consulted on this case and is in agreement with the above treatment plan.

## 2018-08-20 ENCOUNTER — Other Ambulatory Visit (HOSPITAL_COMMUNITY): Payer: Self-pay | Admitting: Family Medicine

## 2018-08-20 DIAGNOSIS — R928 Other abnormal and inconclusive findings on diagnostic imaging of breast: Secondary | ICD-10-CM

## 2018-09-04 ENCOUNTER — Ambulatory Visit (HOSPITAL_COMMUNITY)
Admission: RE | Admit: 2018-09-04 | Discharge: 2018-09-04 | Disposition: A | Discharge status: Home / Self Care | Payer: BLUE CROSS/BLUE SHIELD | Source: Ambulatory Visit | Attending: Family Medicine | Admitting: Family Medicine

## 2018-09-04 ENCOUNTER — Ambulatory Visit (HOSPITAL_COMMUNITY): Payer: BLUE CROSS/BLUE SHIELD

## 2018-09-04 DIAGNOSIS — N644 Mastodynia: Secondary | ICD-10-CM | POA: Insufficient documentation

## 2018-09-17 ENCOUNTER — Encounter: Payer: BLUE CROSS/BLUE SHIELD | Admitting: Family Medicine

## 2018-09-17 DIAGNOSIS — M25551 Pain in right hip: Secondary | ICD-10-CM | POA: Insufficient documentation

## 2018-11-08 ENCOUNTER — Other Ambulatory Visit: Payer: Self-pay

## 2018-11-08 ENCOUNTER — Other Ambulatory Visit (HOSPITAL_COMMUNITY)
Admission: RE | Admit: 2018-11-08 | Discharge: 2018-11-08 | Disposition: A | Discharge status: Home / Self Care | Payer: BLUE CROSS/BLUE SHIELD | Source: Ambulatory Visit | Attending: Family Medicine | Admitting: Family Medicine

## 2018-11-08 ENCOUNTER — Ambulatory Visit (HOSPITAL_COMMUNITY)
Admission: RE | Admit: 2018-11-08 | Discharge: 2018-11-08 | Disposition: A | Discharge status: Home / Self Care | Payer: BLUE CROSS/BLUE SHIELD | Source: Ambulatory Visit | Attending: Family Medicine | Admitting: Family Medicine

## 2018-11-08 ENCOUNTER — Ambulatory Visit (INDEPENDENT_AMBULATORY_CARE_PROVIDER_SITE_OTHER): Payer: BLUE CROSS/BLUE SHIELD | Admitting: Family Medicine

## 2018-11-08 DIAGNOSIS — M79662 Pain in left lower leg: Secondary | ICD-10-CM

## 2018-11-08 DIAGNOSIS — I82452 Acute embolism and thrombosis of left peroneal vein: Secondary | ICD-10-CM | POA: Diagnosis not present

## 2018-11-08 LAB — D-DIMER, QUANTITATIVE: D-Dimer, Quant: 3.72 ug/mL-FEU — ABNORMAL HIGH (ref 0.00–0.50)

## 2018-11-08 MED ORDER — ELIQUIS 5 MG VTE STARTER PACK: ORAL_TABLET | ORAL | 0 refills | Status: DC

## 2018-11-08 MED ORDER — APIXABAN 5 MG PO TABS: 5.0000 mg | ORAL_TABLET | Freq: Two times a day (BID) | ORAL | 5 refills | Status: DC

## 2018-11-08 NOTE — Progress Notes (Signed)
   Subjective:    Patient ID: Wendy Marshall, female    DOB: [birth year], 63 y.o.   MRN: [id number]  HPI Video consultation Patient at home We were at the office Coronavirus outbreak I was also able to see the patient face-to-face after her's ultrasound  Patient calls with left calf pain that started yesterday. Patient states the pain was worse this am. Patient states she has had PE before so she was concerned.  This patient has had a problem with a pulmonary embolus that occurred after hip replacement on that when it started off of calf pain and by the following day she had a pulmonary embolus she started having some pain in her calf yesterday it is tender to touch is not swollen not red no known injury.  No shortness of breath chest pain or tightness She does relate some soreness in the leg when she walks.  Based on all of this she needs a urgent work-up but ideally we would like to keep her out of the ER  Virtual Visit via Video Note  I connected with Wendy Marshall on 11/08/18 at  2:00 PM EDT by a video enabled telemedicine application and verified that I am speaking with the correct person using two identifiers.   I discussed the limitations of evaluation and management by telemedicine and the availability of in person appointments. The patient expressed understanding and agreed to proceed.  History of Present Illness:    Observations/Objective:   Assessment and Plan:   Follow Up Instructions:    I discussed the assessment and treatment plan with the patient. The patient was provided an opportunity to ask questions and all were answered. The patient agreed with the plan and demonstrated an understanding of the instructions.   The patient was advised to call back or seek an in-person evaluation if the symptoms worsen or if the condition fails to improve as anticipated.  I provided 15 minutes of non-face-to-face time during this encounter.    [phone]  Review of  Systems     Objective:   Physical Exam  Both legs appear normal size when she showed them to me on the video      Assessment & Plan:  Left calf pain and tenderness History of pulmonary embolus Stat d-dimer Stat left leg venous ultrasound Await the results  Addendum- her ultrasound did come back with a lower leg DVT.  This patient had a DVT several years ago that rapidly progressed into a pulmonary embolus.  She was on blood thinners for over 6 months.  She was seen by hematology a few years ago and it was recommended at that time to stop her medication because of the thought that her pulmonary embolus was triggered by a recent hip surgery.  Now that this DVT is occurring as a unprovoked DVT it is necessary for this patient to go on anticoagulants.  I have discussed risk and benefits with the patient.  I also discussed warning signs of bleeding and avoiding of all anti-inflammatories.  She understands all of this and is desiring to go forward with treatment for the DVT.  She is very fearful of developing a pulmonary embolus.  We will reach out to hematology.  I believe this patient would benefit seeing them once coronavirus outbreak settles down.  I believe this patient needs serious consideration for lifelong anticoagulation given that this is a second DVT and this particular one appears to be unprovoked.

## 2018-11-15 ENCOUNTER — Telehealth: Payer: Self-pay | Admitting: Family Medicine

## 2018-11-15 DIAGNOSIS — I82452 Acute embolism and thrombosis of left peroneal vein: Secondary | ICD-10-CM

## 2018-11-15 NOTE — Addendum Note (Signed)
Addended by: Vicente Males on: 11/15/2018 11:05 AM   Modules accepted: Orders

## 2018-11-15 NOTE — Telephone Encounter (Signed)
Please be aware some of this message was sent previously as requested set her up for a follow-up-I do not see that she has a follow-up at this point-so please do the following  #1 I would like for the patient to do a follow-up virtual visit within 7 to 14 days regarding her DVT  2.  Please inform the patient that I did communicate with Dr. Delton Coombes excellent hematologist at [facility].  They wholeheartedly agree that she needs to stay on anticoagulant.  I would like for the patient to see hematology in June for consultation with them.  Go ahead with referral  If the patient is having any significant issues we can certainly do the virtual visit sooner

## 2018-11-15 NOTE — Telephone Encounter (Signed)
Contacted patient and pt states that she is doing better. Pt informed that provider would like to do a follow up virtual visit with in 7-14 days regarding DVT. Pt agreed to set up visit. Pt also informed that we will put in a referral to hematology. Pt verbalized understanding. Pt transferred up front to set up appt. Referral placed.

## 2018-11-19 ENCOUNTER — Other Ambulatory Visit: Payer: Self-pay

## 2018-11-19 ENCOUNTER — Ambulatory Visit (INDEPENDENT_AMBULATORY_CARE_PROVIDER_SITE_OTHER): Payer: BLUE CROSS/BLUE SHIELD | Admitting: Family Medicine

## 2018-11-19 DIAGNOSIS — E039 Hypothyroidism, unspecified: Secondary | ICD-10-CM

## 2018-11-19 DIAGNOSIS — Z1159 Encounter for screening for other viral diseases: Secondary | ICD-10-CM

## 2018-11-19 DIAGNOSIS — Z114 Encounter for screening for human immunodeficiency virus [HIV]: Secondary | ICD-10-CM

## 2018-11-19 DIAGNOSIS — E785 Hyperlipidemia, unspecified: Secondary | ICD-10-CM

## 2018-11-19 DIAGNOSIS — Z131 Encounter for screening for diabetes mellitus: Secondary | ICD-10-CM

## 2018-11-19 DIAGNOSIS — Z79899 Other long term (current) drug therapy: Secondary | ICD-10-CM

## 2018-11-19 DIAGNOSIS — I82452 Acute embolism and thrombosis of left peroneal vein: Secondary | ICD-10-CM

## 2018-11-19 NOTE — Progress Notes (Signed)
   Subjective:    Patient ID: Wendy Marshall, female    DOB: 05-27-1956, 63 y.o.   MRN: [id number] Audio and video HPIFollow up DVT left leg. Pt states leg is much better. Taking eliquis 5mg  bid. Patient denies any bleeding issues She states she is taking medicine as directed She denies any complications she states her breathing is doing a little bit better and she states the pain in her leg is gone away she is interested in continuing the medication we did discuss lifelong anticoagulation the patient is open to this idea she will be seeing hematology in early June for their opinion regarding this  The patient also needs lab work done but I have encouraged her not to get it done currently but will wait until early June or late May Has been out of crestor for about one year. Did not have insurance but wants to get bloodwork done to check thyroid and cholesterol.   Virtual Visit via Video Note  I connected with Wendy Marshall on 11/19/18 at  1:10 PM EDT by a video enabled telemedicine application and verified that I am speaking with the correct person using two identifiers.   I discussed the limitations of evaluation and management by telemedicine and the availability of in person appointments. The patient expressed understanding and agreed to proceed.  History of Present Illness:    Observations/Objective:   Assessment and Plan:   Follow Up Instructions:    I discussed the assessment and treatment plan with the patient. The patient was provided an opportunity to ask questions and all were answered. The patient agreed with the plan and demonstrated an understanding of the instructions.   The patient was advised to call back or seek an in-person evaluation if the symptoms worsen or if the condition fails to improve as anticipated.  I provided 15 minutes of non-face-to-face time during this encounter.    The patient has been educated regarding the importance of properly taking  her medications avoiding all anti-inflammatories and avoiding any dangerous activity that could put her at risk of falls or bleeding   Review of Systems     Objective:   Physical Exam        Assessment & Plan:  She is due for some health maintenance screening labs she will do these by early June She also has history of hyperlipidemia previous labs reviewed new labs ordered await the results She is not on medication currently She is tolerating her medication for DVT and she will see hematology in early June for their opinion regarding lifelong anticoagulation She will follow-up with [place] in 6 months sooner problems

## 2018-11-30 ENCOUNTER — Other Ambulatory Visit: Payer: Self-pay

## 2018-11-30 MED ORDER — LEVOTHYROXINE SODIUM 112 MCG PO TABS: 112.0000 ug | ORAL_TABLET | Freq: Every day | ORAL | 1 refills | Status: DC

## 2018-12-31 ENCOUNTER — Inpatient Hospital Stay (HOSPITAL_COMMUNITY): Payer: BLUE CROSS/BLUE SHIELD | Attending: Hematology | Admitting: Hematology

## 2018-12-31 ENCOUNTER — Encounter (HOSPITAL_COMMUNITY): Payer: Self-pay | Admitting: Hematology

## 2018-12-31 ENCOUNTER — Other Ambulatory Visit: Payer: Self-pay

## 2018-12-31 DIAGNOSIS — Z86718 Personal history of other venous thrombosis and embolism: Secondary | ICD-10-CM | POA: Insufficient documentation

## 2018-12-31 DIAGNOSIS — Z79899 Other long term (current) drug therapy: Secondary | ICD-10-CM | POA: Insufficient documentation

## 2018-12-31 DIAGNOSIS — Z7901 Long term (current) use of anticoagulants: Secondary | ICD-10-CM | POA: Insufficient documentation

## 2018-12-31 DIAGNOSIS — Z87891 Personal history of nicotine dependence: Secondary | ICD-10-CM | POA: Insufficient documentation

## 2018-12-31 DIAGNOSIS — Z96643 Presence of artificial hip joint, bilateral: Secondary | ICD-10-CM | POA: Diagnosis not present

## 2018-12-31 DIAGNOSIS — Z86711 Personal history of pulmonary embolism: Secondary | ICD-10-CM | POA: Diagnosis not present

## 2018-12-31 DIAGNOSIS — Z8249 Family history of ischemic heart disease and other diseases of the circulatory system: Secondary | ICD-10-CM | POA: Diagnosis not present

## 2018-12-31 DIAGNOSIS — M1611 Unilateral primary osteoarthritis, right hip: Secondary | ICD-10-CM | POA: Insufficient documentation

## 2018-12-31 DIAGNOSIS — I2699 Other pulmonary embolism without acute cor pulmonale: Secondary | ICD-10-CM

## 2018-12-31 NOTE — Progress Notes (Signed)
CONSULT NOTE  Patient Care Team: [name], MD as PCP - General (Family Medicine)  CHIEF COMPLAINTS/PURPOSE OF CONSULTATION:  Recurrent thromboembolism.  HISTORY OF PRESENTING ILLNESS:  Wendy Marshall 63 y.o. female is seen in consultation today for further work-up and management of duration of anticoagulation.  She had a history of bilateral pulmonary emboli in August 2017, few weeks after left hip replacement surgery.  She apparently did not receive any prophylactic anticoagulation at that time.  She also had left leg swelling and erythema, could not do the ultrasound Doppler on the same day, was started on blood thinner.  Doppler the next day was negative.  She remains on Eliquis for a year which was discontinued.  She developed arthritis in her right hip also and was undergoing steroid injections.  Few weeks after the steroid injection, patient was slightly less mobile.  She noticed left leg swelling in the first week of April and was evaluated by Dr. Wolfgang Phoenix who ordered ultrasound Doppler on 11/08/2018 showing occlusive DVT involving the left peroneal vein, new compared to remote venous Doppler from July 2017 with no extension of this occlusive tibial DVT to the more proximal venous system.  She was started back on Eliquis and she is tolerating it very well.  Denies any bleeding per rectum or melena.  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  She apparently injured her right knee last year when working at [employer]. Family history significant for maternal uncle with DVT x2, in his 36s, unprovoked.    MEDICAL HISTORY:  Past Medical History:  Diagnosis Date  . Arthritis    "all over"  . Chondromalacia of knee, right 08/2017  . Complication of anesthesia    hard to wake up post-op  . Dental crowns present    x 2  . Difficulty swallowing pills    since cervical fusion  . Family history of adverse reaction to anesthesia    pt's  mother has hx. of being hard to wake up post-op  . GERD (gastroesophageal reflux disease)    no current med.  Marland Kitchen Headache    x 3 days (08/18/2017)  . High cholesterol   . History of pulmonary embolus (PE) 03/09/2016   bilateral  . Hypothyroidism   . Limited joint range of motion (ROM)    cervical spine - s/p fusion  . Medial meniscus tear 08/2017   right knee  . Sleep apnea    uses CPAP nightly    SURGICAL HISTORY: Past Surgical History:  Procedure Laterality Date  . ANTERIOR CERVICAL DECOMP/DISCECTOMY FUSION  07/18/2002   C5-6  . CARPAL TUNNEL RELEASE Bilateral   . CHONDROPLASTY Right 08/23/2017   Procedure: CHONDROPLASTY;  Surgeon: Frederik Pear, MD;  Location: [facility];  Service: Orthopedics;  Laterality: Right;  . EYE MUSCLE SURGERY Bilateral   . KNEE ARTHROSCOPY Right 08/23/2017   Procedure: ARTHROSCOPY RIGHT KNEE REMOVAL OF LOOSE BODIES;  Surgeon: Frederik Pear, MD;  Location: [facility];  Service: Orthopedics;  Laterality: Right;  . KNEE ARTHROSCOPY WITH MEDIAL MENISECTOMY Right 08/23/2017   Procedure: KNEE ARTHROSCOPY WITH MEDIAL MENISECTOMY;  Surgeon: Frederik Pear, MD;  Location: [facility];  Service: Orthopedics;  Laterality: Right;  . SHOULDER ARTHROSCOPY Left 06/11/2001  . TONSILLECTOMY     age 65  . TOTAL HIP ARTHROPLASTY Left 02/01/2016   Procedure: LEFT TOTAL HIP ARTHROPLASTY ANTERIOR APPROACH;  Surgeon: Paralee Cancel,  MD;  Location: [facility];  Service: Orthopedics;  Laterality: Left;  Failed Spinal to LMA  . TUBAL LIGATION    . WISDOM TOOTH EXTRACTION      SOCIAL HISTORY: Social History   Socioeconomic History  . Marital status: Married    Spouse name: Not on file  . Number of children: Not on file  . Years of education: Not on file  . Highest education level: Not on file  Occupational History  . Occupation: Surveyor, quantity  Social Needs  . Financial resource strain: Not on file  . Food insecurity:    Worry: Not on  file    Inability: Not on file  . Transportation needs:    Medical: Not on file    Non-medical: Not on file  Tobacco Use  . Smoking status: Former Smoker    Packs/day: 0.00    Years: 11.00    Pack years: 0.00    Last attempt to quit: 07/31/1996    Years since quitting: 22.4  . Smokeless tobacco: Never Used  Substance and Sexual Activity  . Alcohol use: Yes    Comment: rarely  . Drug use: No  . Sexual activity: Not on file  Lifestyle  . Physical activity:    Days per week: Not on file    Minutes per session: Not on file  . Stress: Not on file  Relationships  . Social connections:    Talks on phone: Not on file    Gets together: Not on file    Attends religious service: Not on file    Active member of club or organization: Not on file    Attends meetings of clubs or organizations: Not on file    Relationship status: Not on file  . Intimate partner violence:    Fear of current or ex partner: Not on file    Emotionally abused: Not on file    Physically abused: Not on file    Forced sexual activity: Not on file  Other Topics Concern  . Not on file  Social History Narrative  . Not on file    FAMILY HISTORY: Family History  Problem Relation Age of Onset  . Hypertension Mother   . Anesthesia problems Mother        hard to wake up post-op  . Brain cancer Father   . Colon cancer Other   . Heart attack Other   . Throat cancer Other     ALLERGIES:  is allergic to penicillins; adhesive [tape]; contrast media [iodinated diagnostic agents]; flexeril [cyclobenzaprine]; omnipaque [iohexol]; soma [carisoprodol]; metaxalone; methocarbamol; and tizanidine.  MEDICATIONS:  Current Outpatient Medications  Medication Sig Dispense Refill  . apixaban (ELIQUIS) 5 MG TABS tablet Take 1 tablet (5 mg total) by mouth 2 (two) times daily. 60 tablet 5  . cholecalciferol (VITAMIN D) 1000 units tablet Take 1,000 Units by mouth 2 (two) times daily.     Marland Kitchen levothyroxine (SYNTHROID) 112 MCG  tablet Take 1 tablet (112 mcg total) by mouth daily before breakfast. 90 tablet 1  . loratadine (CLARITIN) 10 MG tablet Take 10 mg by mouth daily.    . Multiple Vitamin (MULTIVITAMIN) tablet Take 1 tablet by mouth daily.    Marland Kitchen MAGNESIUM PO Take 50 mg by mouth daily.     . rosuvastatin (CRESTOR) 5 MG tablet TAKE 1 TABLET BY MOUTH ONCE DAILY (Patient not taking: Reported on 06/11/2018) 30 tablet 0  . vitamin C (ASCORBIC ACID) 500 MG tablet Take 500 mg by mouth daily.  No current facility-administered medications for this visit.     REVIEW OF SYSTEMS:   Constitutional: Denies fevers, chills or abnormal night sweats Eyes: Denies blurriness of vision, double vision or watery eyes Ears, nose, mouth, throat, and face: Denies mucositis or sore throat Respiratory: Denies cough, dyspnea or wheezes.  Occasional shortness of breath. Cardiovascular: Denies palpitation, chest discomfort or lower extremity swelling Gastrointestinal:  Denies nausea, heartburn or change in bowel habits Skin: Denies abnormal skin rashes Lymphatics: Denies new lymphadenopathy or easy bruising Neurological:Denies numbness, tingling or new weaknesses Behavioral/Psych: Mood is stable, no new changes  All other systems were reviewed with the patient and are negative.  PHYSICAL EXAMINATION: ECOG PERFORMANCE STATUS: 1 - Symptomatic but completely ambulatory  Vitals:   12/31/18 1321  BP: 125/80  Pulse: 100  Resp: 18  Temp: 98.7 F (37.1 C)  SpO2: 97%   Filed Weights   12/31/18 1321  Weight: 218 lb (98.9 kg)    GENERAL:alert, no distress and comfortable SKIN: skin color, texture, turgor are normal, no rashes or significant lesions EYES: normal, conjunctiva are pink and non-injected, sclera clear OROPHARYNX:no exudate, no erythema and lips, buccal mucosa, and tongue normal  NECK: supple, thyroid normal size, non-tender, without nodularity LYMPH:  no palpable lymphadenopathy in the cervical, axillary or  inguinal LUNGS: clear to auscultation and percussion with normal breathing effort HEART: regular rate & rhythm and no murmurs and no lower extremity edema ABDOMEN:abdomen soft, non-tender and normal bowel sounds Musculoskeletal:no cyanosis of digits and no clubbing  PSYCH: alert & oriented x 3 with fluent speech NEURO: no focal motor/sensory deficits Left leg shows chronic venous stasis changes. LABORATORY DATA:  I have reviewed the data as listed Recent Results (from the past 2160 hour(s))  D-dimer, quantitative (not at [facility])     Status: Abnormal   Collection Time: 11/08/18  3:28 PM  Result Value Ref Range   D-Dimer, Quant 3.72 (H) 0.00 - 0.50 ug/mL-FEU    Comment: (NOTE) At the manufacturer cut-off of 0.50 ug/mL FEU, this assay has been documented to exclude PE with a sensitivity and negative predictive value of 97 to 99%.  At this time, this assay has not been approved by the FDA to exclude DVT/VTE. Results should be correlated with clinical presentation. Performed at [facility], [address] [place], [place] [address]     RADIOGRAPHIC STUDIES: I have personally reviewed the radiological images as listed and agreed with the findings in the report.  ASSESSMENT & PLAN:  Pulmonary embolism ([place]) 1.  Recurrent thromboembolism: - Patient had bilateral pulmonary emboli on 03/09/2016 after left hip replacement surgery.  She also had left leg swelling and erythema, but Doppler was negative for DVT.  She was treated with Eliquis for a year. -She noticed swelling of her left leg in the first week of April, few weeks after having steroid injection in the hip.  She denies any history of surgery or other immobility. - Ultrasound Doppler on 11/08/2018 shows occlusive DVT involving the left peroneal vein, new compared to remote venous Doppler ultrasound from July 2017.  There is no extension of this occlusive tibial DVT to the more proximal venous system. -Patient currently on Eliquis 5 mg  twice daily and tolerating well. -She has right hip arthritis and requires placement.  She also injured her right knee at work a year ago.  She underwent arthroscopic surgery. -Family history significant for maternal uncle with DVT x2 in his 31s. -Patient does not have a history of connective  tissue disorders.  She denies any history of miscarriages. - We had a prolonged discussion about her history of thrombosis.  Second episode is considered unprovoked or weekly provoked.  Hence I have recommended indefinite anticoagulation at this time as benefits outweigh risks. -I did not recommend any hypercoagulable testing at this time. -We will reevaluate her in 1 year to assess the risk-benefit ratio.  2.  Family history: -Father had brain tumor.  Maternal grandmother had cervical cancer.  Paternal grandmother had colon cancer. -Maternal uncle had unprovoked DVT x2 in his 47s.     All questions were answered. The patient knows to call the clinic with any problems, questions or concerns.      [name], MD 12/31/18 3:23 PM

## 2018-12-31 NOTE — Patient Instructions (Signed)
[place] at [facility] Discharge Instructions  You were seen today by Dr. Delton Coombes. He went over your history, family history and how you've been doing lately. He will see you back in 1 year for labs and follow up.   Thank you for choosing [place] at [facility] to provide your oncology and hematology care.  To afford each patient quality time with our provider, please arrive at least 15 minutes before your scheduled appointment time.   If you have a lab appointment with the [facility] please come in thru the  Main Entrance and check in at the main information desk  You need to re-schedule your appointment should you arrive 10 or more minutes late.  We strive to give you quality time with our providers, and arriving late affects you and other patients whose appointments are after yours.  Also, if you no show three or more times for appointments you may be dismissed from the clinic at the providers discretion.     Again, thank you for choosing [facility].  Our hope is that these requests will decrease the amount of time that you wait before being seen by our physicians.       _____________________________________________________________  Should you have questions after your visit to [facility], please contact our office at [phone] between the hours of 8:00 a.m. and 4:30 p.m.  Voicemails left after 4:00 p.m. will not be returned until the following business day.  For prescription refill requests, have your pharmacy contact our office and allow 72 hours.    Cancer Center Support Programs:   > Cancer Support Group  2nd Tuesday of the month 1pm-2pm, Journey Room

## 2018-12-31 NOTE — Assessment & Plan Note (Addendum)
1.  Recurrent thromboembolism: - Patient had bilateral pulmonary emboli on 03/09/2016 after left hip replacement surgery.  She also had left leg swelling and erythema, but Doppler was negative for DVT.  She was treated with Eliquis for a year. -She noticed swelling of her left leg in the first week of April, few weeks after having steroid injection in the hip.  She denies any history of surgery or other immobility. - Ultrasound Doppler on 11/08/2018 shows occlusive DVT involving the left peroneal vein, new compared to remote venous Doppler ultrasound from July 2017.  There is no extension of this occlusive tibial DVT to the more proximal venous system. -Patient currently on Eliquis 5 mg twice daily and tolerating well. -She has right hip arthritis and requires placement.  She also injured her right knee at work a year ago.  She underwent arthroscopic surgery. -Family history significant for maternal uncle with DVT x2 in his 33s. -Patient does not have a history of connective tissue disorders.  She denies any history of miscarriages. - We had a prolonged discussion about her history of thrombosis.  Second episode is considered unprovoked or weekly provoked.  Hence I have recommended indefinite anticoagulation at this time as benefits outweigh risks. -I did not recommend any hypercoagulable testing at this time. -We will reevaluate her in 1 year to assess the risk-benefit ratio.  2.  Family history: -Father had brain tumor.  Maternal grandmother had cervical cancer.  Paternal grandmother had colon cancer. -Maternal uncle had unprovoked DVT x2 in his 89s.

## 2019-01-14 DIAGNOSIS — M25561 Pain in right knee: Secondary | ICD-10-CM | POA: Insufficient documentation

## 2019-02-07 LAB — LIPID PANEL
Chol/HDL Ratio: 4.7 ratio — ABNORMAL HIGH (ref 0.0–4.4)
Cholesterol, Total: 264 mg/dL — ABNORMAL HIGH (ref 100–199)
HDL: 56 mg/dL (ref >39)
LDL Calculated: 174 mg/dL — ABNORMAL HIGH (ref 0–99)
Triglycerides: 168 mg/dL — ABNORMAL HIGH (ref 0–149)
VLDL Cholesterol Cal: 34 mg/dL (ref 5–40)

## 2019-02-07 LAB — TSH: TSH: 0.407 u[IU]/mL — ABNORMAL LOW (ref 0.450–4.500)

## 2019-02-07 LAB — GLUCOSE, RANDOM: Glucose: 90 mg/dL (ref 65–99)

## 2019-02-07 LAB — HIV ANTIBODY (ROUTINE TESTING W REFLEX): HIV Screen 4th Generation wRfx: NONREACTIVE

## 2019-02-07 LAB — HEPATITIS C ANTIBODY: Hep C Virus Ab: 0.2 s/co ratio (ref 0.0–0.9)

## 2019-02-11 ENCOUNTER — Other Ambulatory Visit: Payer: Self-pay | Admitting: *Deleted

## 2019-02-11 MED ORDER — LEVOTHYROXINE SODIUM 112 MCG PO TABS: ORAL_TABLET | ORAL | 1 refills | Status: DC

## 2019-02-17 ENCOUNTER — Telehealth: Payer: Self-pay | Admitting: Family Medicine

## 2019-02-17 NOTE — Telephone Encounter (Signed)
Nurses I received a request from [facility] regarding preoperative risk evaluation We can use the lab work she already completed in July but we will need to do a virtual visit to discuss anticoagulant stoppage before surgery So please schedule virtual visit within the next 2 weeks

## 2019-02-18 NOTE — Telephone Encounter (Signed)
Pt contacted and verbalized understanding. Pt transferred up front to set up appt  

## 2019-02-20 MED ORDER — ROSUVASTATIN CALCIUM 5 MG PO TABS: 5.0000 mg | ORAL_TABLET | Freq: Every day | ORAL | 5 refills | Status: DC

## 2019-02-20 NOTE — Addendum Note (Signed)
Addended by: Vicente Males on: 02/20/2019 02:40 PM   Modules accepted: Orders

## 2019-03-02 HISTORY — PX: KNEE SURGERY: SHX244

## 2019-03-04 ENCOUNTER — Other Ambulatory Visit: Payer: Self-pay

## 2019-03-04 ENCOUNTER — Ambulatory Visit (INDEPENDENT_AMBULATORY_CARE_PROVIDER_SITE_OTHER): Payer: BC Managed Care – PPO | Admitting: Family Medicine

## 2019-03-04 DIAGNOSIS — E785 Hyperlipidemia, unspecified: Secondary | ICD-10-CM | POA: Diagnosis not present

## 2019-03-04 DIAGNOSIS — E039 Hypothyroidism, unspecified: Secondary | ICD-10-CM | POA: Diagnosis not present

## 2019-03-04 DIAGNOSIS — M25562 Pain in left knee: Secondary | ICD-10-CM

## 2019-03-04 NOTE — Progress Notes (Signed)
Subjective:    Patient ID: Wendy Marshall, female    DOB: 05-20-1956, 63 y.o.   MRN: [id number]  HPI having knee surgery on august 17th with Dr. Alvan Dame. Needs surgical clearance. This patient is facing knee arthroscope in the middle of August.  She is on a blood thinner currently for blood clots.  Has seen the specialist.  She is also faced with the possibility of hip replacement surgery later this year.  Patient denies any chest tightness pressure pain or shortness of breath with activity.  She states her overall energy level is doable.  Denies shortness of breath with walking around.  I feel that the patient is at low risk for complications with her surgery but because of risk of DVT we will touch base with her hematologist. Virtual Visit via Telephone Note  I connected with Wendy Marshall on 03/04/19 at  2:30 PM EDT by telephone and verified that I am speaking with the correct person using two identifiers.  Location: Patient: home Provider: office   I discussed the limitations, risks, security and privacy concerns of performing an evaluation and management service by telephone and the availability of in person appointments. I also discussed with the patient that there may be a patient responsible charge related to this service. The patient expressed understanding and agreed to proceed.   History of Present Illness:    Observations/Objective:   Assessment and Plan:   Follow Up Instructions:    I discussed the assessment and treatment plan with the patient. The patient was provided an opportunity to ask questions and all were answered. The patient agreed with the plan and demonstrated an understanding of the instructions.   The patient was advised to call back or seek an in-person evaluation if the symptoms worsen or if the condition fails to improve as anticipated.  I provided 15 minutes of non-face-to-face time during this encounter.      Review of Systems   Constitutional: Negative for activity change and appetite change.  HENT: Negative for congestion and rhinorrhea.   Respiratory: Negative for cough and shortness of breath.   Cardiovascular: Negative for chest pain and leg swelling.  Gastrointestinal: Negative for abdominal pain, nausea and vomiting.  Skin: Negative for color change.  Neurological: Negative for dizziness and weakness.  Psychiatric/Behavioral: Negative for agitation and confusion.       Objective:   Physical Exam  Today's visit was via telephone Physical exam was not possible for this visit       Assessment & Plan:  Knee pain Upcoming arthroscopy I believe the patient can have this surgery and from a cardiovascular standpoint is stable for She is on Eliquis We will touch base with hematology to see if they can stop this 3 days prior Hopefully she will not have to be on Lovenox Hopefully can restart Eliquis the day after her surgery.  Patient was seen today regarding hypothyroidism.  Importance of healthy diet, regular physical activity was discussed.  Importance of compliance with medication and regular checks regarding this was discussed.   The patient was seen today as part of an evaluation regarding hyperlipidemia.  Recent lab work has been reviewed with the patient as well as the goals for good cholesterol care.  In addition to this medications have been discussed the importance of compliance with diet and medications discussed as well.  Finally the patient is aware that poor control of cholesterol, noncompliance can dramatically increase the risk of complications. The patient will keep  regular office visits and the patient does agreed to periodic lab work.  Patient will do lab work at thyroid and lipid in several more weeks to make sure she is on the proper dosing  Follow-up 6 months  Recent lab work from July discussed with the patient Patient states that she felt that she may have had COVID earlier  this year she is stating that she may want to do antibody test but she is going to check with her insurance company first to see if they will pay for this test  Currently she is not sick with COVID I believe medically that she is stable to have surgery I did communicate with her hematologist via EMR They indicated that the patient may stop her Eliquis 3 days before the surgery She may resume the Eliquis the day after surgery We will communicate this to the patient We will also send a copy of this dictation along with surgical release to her surgeon Dr.Olin

## 2019-04-18 LAB — HEPATIC FUNCTION PANEL
ALT: 9 IU/L (ref 0–32)
AST: 19 IU/L (ref 0–40)
Albumin: 4.4 g/dL (ref 3.8–4.8)
Alkaline Phosphatase: 88 IU/L (ref 39–117)
Bilirubin Total: 0.5 mg/dL (ref 0.0–1.2)
Bilirubin, Direct: 0.12 mg/dL (ref 0.00–0.40)
Total Protein: 7.2 g/dL (ref 6.0–8.5)

## 2019-04-18 LAB — LIPID PANEL
Chol/HDL Ratio: 3.1 ratio (ref 0.0–4.4)
Cholesterol, Total: 171 mg/dL (ref 100–199)
HDL: 56 mg/dL (ref >39)
LDL Chol Calc (NIH): 87 mg/dL (ref 0–99)
Triglycerides: 163 mg/dL — ABNORMAL HIGH (ref 0–149)
VLDL Cholesterol Cal: 28 mg/dL (ref 5–40)

## 2019-04-18 LAB — T4, FREE: Free T4: 1.46 ng/dL (ref 0.82–1.77)

## 2019-04-18 LAB — TSH: TSH: 0.628 u[IU]/mL (ref 0.450–4.500)

## 2019-05-02 HISTORY — PX: TOTAL HIP ARTHROPLASTY: SHX124

## 2019-05-08 ENCOUNTER — Other Ambulatory Visit (HOSPITAL_COMMUNITY): Payer: Self-pay | Admitting: Orthopedic Surgery

## 2019-05-08 ENCOUNTER — Ambulatory Visit (HOSPITAL_COMMUNITY)
Admission: RE | Admit: 2019-05-08 | Discharge: 2019-05-08 | Disposition: A | Discharge status: Home / Self Care | Payer: BC Managed Care – PPO | Source: Ambulatory Visit | Attending: Cardiology | Admitting: Cardiology

## 2019-05-08 ENCOUNTER — Other Ambulatory Visit: Payer: Self-pay

## 2019-05-08 DIAGNOSIS — M79605 Pain in left leg: Secondary | ICD-10-CM

## 2019-05-20 ENCOUNTER — Other Ambulatory Visit: Payer: Self-pay | Admitting: Family Medicine

## 2019-05-22 ENCOUNTER — Encounter: Payer: Self-pay | Admitting: Family Medicine

## 2019-05-22 ENCOUNTER — Ambulatory Visit (INDEPENDENT_AMBULATORY_CARE_PROVIDER_SITE_OTHER): Payer: BC Managed Care – PPO | Admitting: Family Medicine

## 2019-05-22 ENCOUNTER — Telehealth: Payer: Self-pay | Admitting: Family Medicine

## 2019-05-22 ENCOUNTER — Other Ambulatory Visit: Payer: Self-pay

## 2019-05-22 VITALS — BP 132/88 | HR 98 | Temp 96.2°F | Wt 219.2 lb

## 2019-05-22 DIAGNOSIS — E039 Hypothyroidism, unspecified: Secondary | ICD-10-CM | POA: Diagnosis not present

## 2019-05-22 DIAGNOSIS — E785 Hyperlipidemia, unspecified: Secondary | ICD-10-CM | POA: Diagnosis not present

## 2019-05-22 NOTE — Telephone Encounter (Signed)
I documented a letter to be sent to [place] Please fax it He is a orthopedic surgeon in [place] Regarding this patient with surgical clearance and her Eliquis thank you

## 2019-05-22 NOTE — Progress Notes (Signed)
   Subjective:    Patient ID: Wendy Marshall, female    DOB: 04-Apr-1956, 63 y.o.   MRN: [id number]  HPI Pt is having a hip replacement on Monday in [place] at the [facility]. Pt is on Eliquis and the [facility] Center is needing to know about the bridge after surgery.  Patient has hip replacement coming up She is on Eliquis She had a DVT back in April Saw hematology in June They recommend for her to stay on Eliquis indefinitely because of repetitive blood clots Has history of pulmonary embolus She denies any type of chest tightness pressure pain or shortness of breath Able to essentially be active without any angina symptoms does not need any cardiac work-up for her surgery.  Review of Systems  Constitutional: Negative for activity change, fatigue and fever.  HENT: Negative for congestion and rhinorrhea.   Respiratory: Negative for cough, chest tightness and shortness of breath.   Cardiovascular: Negative for chest pain and leg swelling.  Gastrointestinal: Negative for abdominal pain and nausea.  Musculoskeletal: Positive for arthralgias. Negative for back pain.  Skin: Negative for color change.  Neurological: Negative for dizziness and headaches.  Psychiatric/Behavioral: Negative for agitation and behavioral problems.       Objective:   Physical Exam Vitals signs reviewed.  Constitutional:      General: She is not in acute distress. HENT:     Head: Normocephalic and atraumatic.  Eyes:     General:        Right eye: No discharge.        Left eye: No discharge.  Neck:     Trachea: No tracheal deviation.  Cardiovascular:     Rate and Rhythm: Normal rate and regular rhythm.     Heart sounds: Normal heart sounds. No murmur.  Pulmonary:     Effort: Pulmonary effort is normal. No respiratory distress.     Breath sounds: Normal breath sounds.  Lymphadenopathy:     Cervical: No cervical adenopathy.  Skin:    General: Skin is warm and dry.  Neurological:     Mental  Status: She is alert.     Coordination: Coordination normal.  Psychiatric:        Behavior: Behavior normal.           Assessment & Plan:  She is at risk of developing blood clots But the procedure she is going through is high risk for bleeding Based on protocols she would stop her Eliquis 2 days before and therefore do not take it on Saturday and Sunday and then have her surgery on Monday She would resume the medication on Wednesday  We will double check with her hematologist regarding this  As for bridging we will see what the hematologist has to say  Should be noted that I did communicate with hematology. They agreed with stopping medication 2 days prior-with the surgery being on Monday they will not take it Saturday and Sunday May reinitiate Eliquis 1 to 2 days after surgery based upon surgeons input  A MyChart message was sent to the patient regarding this

## 2019-06-30 ENCOUNTER — Other Ambulatory Visit: Payer: Self-pay | Admitting: Family Medicine

## 2019-07-10 ENCOUNTER — Ambulatory Visit (HOSPITAL_COMMUNITY)
Admission: RE | Admit: 2019-07-10 | Discharge: 2019-07-10 | Disposition: A | Discharge status: Home / Self Care | Payer: BC Managed Care – PPO | Source: Ambulatory Visit | Attending: Cardiology | Admitting: Cardiology

## 2019-07-10 ENCOUNTER — Other Ambulatory Visit: Payer: Self-pay

## 2019-07-10 ENCOUNTER — Other Ambulatory Visit (HOSPITAL_COMMUNITY): Payer: Self-pay | Admitting: Orthopedic Surgery

## 2019-07-10 DIAGNOSIS — M7989 Other specified soft tissue disorders: Secondary | ICD-10-CM | POA: Diagnosis not present

## 2019-07-10 DIAGNOSIS — M79661 Pain in right lower leg: Secondary | ICD-10-CM

## 2019-08-27 ENCOUNTER — Ambulatory Visit (INDEPENDENT_AMBULATORY_CARE_PROVIDER_SITE_OTHER): Payer: 59 | Admitting: Adult Health

## 2019-08-27 ENCOUNTER — Encounter: Payer: Self-pay | Admitting: Adult Health

## 2019-08-27 ENCOUNTER — Other Ambulatory Visit: Payer: Self-pay

## 2019-08-27 VITALS — BP 135/75 | HR 97 | Ht 64.0 in | Wt 219.0 lb

## 2019-08-27 DIAGNOSIS — R10814 Left lower quadrant abdominal tenderness: Secondary | ICD-10-CM | POA: Insufficient documentation

## 2019-08-27 DIAGNOSIS — R102 Pelvic and perineal pain: Secondary | ICD-10-CM | POA: Diagnosis not present

## 2019-08-27 NOTE — Progress Notes (Signed)
Patient ID: Wendy Marshall, female   DOB: 05/17/1956, 64 y.o.   MRN: [id number] History of Present Illness: Wendy Marshall is a 64 year old white female, married, PM, in complaining of pelvic pressure for ab out 3 months now, feels like something wants to fall out.  Last pap about 6 years ago and has history of PE and DVT is on Eliquis. PCP is Dr Wendy Marshall    Current Medications, Allergies, Past Medical History, Past Surgical History, Family History and Social History were reviewed in [facility] record.     Review of Systems: Has pelvic pressure, like something falling out for last 3 months, since had right hip replacement surgery  No sex in over 3 months Denies any problems with urination or bowel movements    Physical Exam:BP 135/75 (BP Location: Left Arm, Patient Position: Sitting, Cuff Size: Normal)   Pulse 97   Ht 5\' 4"  (1.626 m)   Wt 219 lb (99.3 kg)   BMI 37.59 kg/m  General:  Well developed, well nourished, no acute distress Skin:  Warm and dry Neck:  Midline trachea, normal thyroid, good ROM, no lymphadenopathy Lungs; Clear to auscultation bilaterally Cardiovascular: Regular rate and rhythm Pelvic:  External genitalia is normal in appearance, no lesions.  The vagina is normal in appearance. Urethra has no lesions or masses. The cervix is bulbous, and smooth.  Uterus is felt to be normal size, shape, and contour. Support seems to be good. No adnexal masses,LLQ tenderness noted.Bladder is non tender, no masses felt. Rectal: Good sphincter tone, no polyps, or hemorrhoids felt.  No rectocele Psych:  No mood changes, alert and cooperative,seems happy Fall risk is low  PHQ 2 score is 1 Examination chaperoned by Terri Skains NP student.   Impression and Plan: 1. Left lower quadrant abdominal tenderness without rebound tenderness GYN [place] in about a week Then see me the next week for pap and physical   2. Pelvic pressure in female GYN [place] in about a week

## 2019-09-03 ENCOUNTER — Ambulatory Visit (INDEPENDENT_AMBULATORY_CARE_PROVIDER_SITE_OTHER): Payer: 59

## 2019-09-03 ENCOUNTER — Other Ambulatory Visit: Payer: Self-pay

## 2019-09-03 DIAGNOSIS — R10814 Left lower quadrant abdominal tenderness: Secondary | ICD-10-CM | POA: Diagnosis not present

## 2019-09-03 DIAGNOSIS — R102 Pelvic and perineal pain: Secondary | ICD-10-CM

## 2019-09-03 NOTE — Progress Notes (Signed)
PELVIC US TA/TV:homogeneous anteverted uterus,wnl,EEC 3 mm,normal ovaries (limited view),ovaries only visualized on transabdominal images,no free fluid,some pelvic discomfort during ultrasound  Chaperone Marcie Bal

## 2019-09-04 ENCOUNTER — Other Ambulatory Visit: Payer: Self-pay | Admitting: Family Medicine

## 2019-09-04 NOTE — Telephone Encounter (Signed)
lvm to schedule virtual visit in spring

## 2019-09-04 NOTE — Telephone Encounter (Signed)
Please schedule and then route back to nurses to send in refill °

## 2019-09-04 NOTE — Telephone Encounter (Signed)
90-day refill, follow-up virtual visit this spring

## 2019-09-05 NOTE — Telephone Encounter (Signed)
Scheduled 4/7

## 2019-09-10 ENCOUNTER — Encounter: Payer: Self-pay | Admitting: Adult Health

## 2019-09-10 ENCOUNTER — Other Ambulatory Visit: Payer: Self-pay

## 2019-09-10 ENCOUNTER — Other Ambulatory Visit (HOSPITAL_COMMUNITY)
Admission: RE | Admit: 2019-09-10 | Discharge: 2019-09-10 | Disposition: A | Discharge status: Home / Self Care | Payer: 59 | Source: Ambulatory Visit | Attending: Adult Health | Admitting: Adult Health

## 2019-09-10 ENCOUNTER — Ambulatory Visit (INDEPENDENT_AMBULATORY_CARE_PROVIDER_SITE_OTHER): Payer: 59 | Admitting: Adult Health

## 2019-09-10 VITALS — BP 128/80 | HR 89 | Ht 64.0 in | Wt 221.0 lb

## 2019-09-10 DIAGNOSIS — Z01419 Encounter for gynecological examination (general) (routine) without abnormal findings: Secondary | ICD-10-CM | POA: Insufficient documentation

## 2019-09-10 DIAGNOSIS — Z1212 Encounter for screening for malignant neoplasm of rectum: Secondary | ICD-10-CM | POA: Diagnosis not present

## 2019-09-10 DIAGNOSIS — R102 Pelvic and perineal pain: Secondary | ICD-10-CM | POA: Diagnosis not present

## 2019-09-10 DIAGNOSIS — Z1211 Encounter for screening for malignant neoplasm of colon: Secondary | ICD-10-CM | POA: Diagnosis not present

## 2019-09-10 LAB — HEMOCCULT GUIAC POC 1CARD (OFFICE): Fecal Occult Blood, POC: NEGATIVE

## 2019-09-10 NOTE — Patient Instructions (Signed)

## 2019-09-10 NOTE — Progress Notes (Addendum)
Patient ID: Wendy Marshall, female   DOB: 20-Sep-1955, 64 y.o.   MRN: [id number] History of Present Illness: Wendy Marshall is a 64 year old white female, married, PM in for a well woman gyn exam and pap.She says has less pressure.Had GYN [place] last week. PCP is Dr Sallee Lange.   Current Medications, Allergies, Past Medical History, Past Surgical History, Family History and Social History were reviewed in [employer] record.     Review of Systems: Patient denies any headaches, hearing loss, fatigue, blurred vision, shortness of breath, chest pain, abdominal pain, problems with bowel movements(has IBS), urination, or intercourse. No mood swings.Does have joint pain, has had both hips replaced, sees Dr Alvan Dame.  Has less pelvic pressure    Physical Exam:BP 128/80 (BP Location: Left Arm, Patient Position: Sitting, Cuff Size: Normal)   Pulse 89   Ht 5\' 4"  (1.626 m)   Wt 221 lb (100.2 kg)   BMI 37.93 kg/m  General:  Well developed, well nourished, no acute distress Skin:  Warm and dry Neck:  Midline trachea, normal thyroid, good ROM, no lymphadenopathy,no carotid bruits heard Lungs; Clear to auscultation bilaterally Breast:  No dominant palpable mass, retraction, or nipple discharge Cardiovascular: Regular rate and rhythm Abdomen:  Soft, non tender, no hepatosplenomegaly Pelvic:  External genitalia is normal in appearance, no lesions.  The vagina is normal in appearance. Urethra has no lesions or masses. The cervix is smooth, pap with high risk HPV 16/18 genotyping performed.  Uterus is felt to be normal size, shape, and contour.  No adnexal masses, mild tenderness noted.Bladder is non tender, no masses felt. Rectal: Good sphincter tone, no polyps, or hemorrhoids felt.  Hemoccult negative. Extremities/musculoskeletal:  No swelling or varicosities noted, no clubbing or cyanosis Psych:  No mood changes, alert and cooperative,seems happy Fall risk is low PHQ 2 score is 0 Co exam by  Terri Skains NP student US showed normal uterus and EC 3 mm, ovaries appeared normal, I discussed with her I think her pressure may be GI related and she says she is going to call GI.   Impression and Plan: 1. Encounter for gynecological examination with Papanicolaou smear of cervix Pap sent Physical in 1 year Pap in 3 if normal Mammogram yearly Labs with PCP  2. Screening for colorectal cancer Colonoscopy per GI  3. Pelvic pressure in female Follow up with GI

## 2019-09-11 LAB — CYTOLOGY - PAP
Comment: NEGATIVE
Diagnosis: NEGATIVE
High risk HPV: NEGATIVE

## 2019-10-27 ENCOUNTER — Other Ambulatory Visit: Payer: Self-pay | Admitting: Family Medicine

## 2019-10-31 DIAGNOSIS — Z23 Encounter for immunization: Secondary | ICD-10-CM | POA: Diagnosis not present

## 2019-11-06 ENCOUNTER — Ambulatory Visit: Payer: Self-pay | Admitting: Family Medicine

## 2019-11-21 ENCOUNTER — Telehealth: Payer: Self-pay | Admitting: Family Medicine

## 2019-11-21 DIAGNOSIS — D509 Iron deficiency anemia, unspecified: Secondary | ICD-10-CM

## 2019-11-21 DIAGNOSIS — E039 Hypothyroidism, unspecified: Secondary | ICD-10-CM

## 2019-11-21 DIAGNOSIS — E785 Hyperlipidemia, unspecified: Secondary | ICD-10-CM

## 2019-11-21 NOTE — Telephone Encounter (Signed)
Patient is wondering if she needs labs for medication follow up on May 3. She is requesting refills on Rosuvastatin 5 mg.Levothyroxine 112 mg because she will run out before appointment.  [facility] [place]

## 2019-11-21 NOTE — Telephone Encounter (Signed)
Lab orders placed and pt is aware. Pt would like lab orders mailed to her

## 2019-11-21 NOTE — Telephone Encounter (Signed)
Last labs completed on 04/17/2019 TSH, Free T4, hepatic and lipid. Please advise. Thank you

## 2019-11-21 NOTE — Telephone Encounter (Signed)
I recommend lipid, liver, metabolic 7, TSH, free T4, CBC Hyperlipidemia, hypothyroidism, microcytic anemia

## 2019-11-27 DIAGNOSIS — E039 Hypothyroidism, unspecified: Secondary | ICD-10-CM | POA: Diagnosis not present

## 2019-11-27 DIAGNOSIS — E785 Hyperlipidemia, unspecified: Secondary | ICD-10-CM | POA: Diagnosis not present

## 2019-11-27 DIAGNOSIS — D509 Iron deficiency anemia, unspecified: Secondary | ICD-10-CM | POA: Diagnosis not present

## 2019-11-28 LAB — CBC WITH DIFFERENTIAL/PLATELET
Basophils Absolute: 0 10*3/uL (ref 0.0–0.2)
Basos: 1 %
EOS (ABSOLUTE): 0.2 10*3/uL (ref 0.0–0.4)
Eos: 4 %
Hematocrit: 43.4 % (ref 34.0–46.6)
Hemoglobin: 14 g/dL (ref 11.1–15.9)
Immature Grans (Abs): 0 10*3/uL (ref 0.0–0.1)
Immature Granulocytes: 0 %
Lymphocytes Absolute: 1.8 10*3/uL (ref 0.7–3.1)
Lymphs: 36 %
MCH: 25.2 pg — ABNORMAL LOW (ref 26.6–33.0)
MCHC: 32.3 g/dL (ref 31.5–35.7)
MCV: 78 fL — ABNORMAL LOW (ref 79–97)
Monocytes Absolute: 0.3 10*3/uL (ref 0.1–0.9)
Monocytes: 7 %
Neutrophils Absolute: 2.7 10*3/uL (ref 1.4–7.0)
Neutrophils: 52 %
Platelets: 273 10*3/uL (ref 150–450)
RBC: 5.55 x10E6/uL — ABNORMAL HIGH (ref 3.77–5.28)
RDW: 14.7 % (ref 11.7–15.4)
WBC: 5.1 10*3/uL (ref 3.4–10.8)

## 2019-11-28 LAB — HEPATIC FUNCTION PANEL
ALT: 11 IU/L (ref 0–32)
AST: 19 IU/L (ref 0–40)
Albumin: 4.5 g/dL (ref 3.8–4.8)
Alkaline Phosphatase: 102 IU/L (ref 39–117)
Bilirubin Total: 0.5 mg/dL (ref 0.0–1.2)
Bilirubin, Direct: 0.15 mg/dL (ref 0.00–0.40)
Total Protein: 7.4 g/dL (ref 6.0–8.5)

## 2019-11-28 LAB — BASIC METABOLIC PANEL
BUN/Creatinine Ratio: 12 (ref 12–28)
BUN: 12 mg/dL (ref 8–27)
CO2: 23 mmol/L (ref 20–29)
Calcium: 9.5 mg/dL (ref 8.7–10.3)
Chloride: 104 mmol/L (ref 96–106)
Creatinine, Ser: 1.02 mg/dL — ABNORMAL HIGH (ref 0.57–1.00)
GFR calc Af Amer: 69 mL/min/{1.73_m2} (ref >59)
GFR calc non Af Amer: 60 mL/min/{1.73_m2} (ref >59)
Glucose: 95 mg/dL (ref 65–99)
Potassium: 4.4 mmol/L (ref 3.5–5.2)
Sodium: 141 mmol/L (ref 134–144)

## 2019-11-28 LAB — LIPID PANEL
Chol/HDL Ratio: 2.9 ratio (ref 0.0–4.4)
Cholesterol, Total: 171 mg/dL (ref 100–199)
HDL: 59 mg/dL (ref >39)
LDL Chol Calc (NIH): 90 mg/dL (ref 0–99)
Triglycerides: 123 mg/dL (ref 0–149)
VLDL Cholesterol Cal: 22 mg/dL (ref 5–40)

## 2019-11-28 LAB — TSH: TSH: 1.54 u[IU]/mL (ref 0.450–4.500)

## 2019-11-28 LAB — T4, FREE: Free T4: 1.42 ng/dL (ref 0.82–1.77)

## 2019-11-29 ENCOUNTER — Other Ambulatory Visit: Payer: Self-pay | Admitting: Family Medicine

## 2019-11-29 NOTE — Telephone Encounter (Signed)
Pt.notified

## 2019-11-29 NOTE — Telephone Encounter (Signed)
When calling to prescreen pt had to change to a virtual visit, pt states she's out of her thyroid medicine & won't have any for the weekend, pt is scheduled for a virtual visit Monday morning, can we send in refill?  Please advise & call pt when done

## 2019-11-29 NOTE — Telephone Encounter (Signed)
Refills sent. Left message to return call to let pt know.

## 2019-12-02 ENCOUNTER — Encounter: Payer: Self-pay | Admitting: Family Medicine

## 2019-12-02 ENCOUNTER — Telehealth (INDEPENDENT_AMBULATORY_CARE_PROVIDER_SITE_OTHER): Payer: Medicare HMO | Admitting: Family Medicine

## 2019-12-02 ENCOUNTER — Telehealth: Payer: Self-pay | Admitting: *Deleted

## 2019-12-02 ENCOUNTER — Other Ambulatory Visit: Payer: Self-pay

## 2019-12-02 VITALS — Ht 64.0 in | Wt 219.0 lb

## 2019-12-02 DIAGNOSIS — E039 Hypothyroidism, unspecified: Secondary | ICD-10-CM | POA: Diagnosis not present

## 2019-12-02 DIAGNOSIS — E785 Hyperlipidemia, unspecified: Secondary | ICD-10-CM

## 2019-12-02 MED ORDER — GABAPENTIN 100 MG PO CAPS: 100.0000 mg | ORAL_CAPSULE | Freq: Three times a day (TID) | ORAL | 1 refills | Status: DC

## 2019-12-02 MED ORDER — LEVOTHYROXINE SODIUM 112 MCG PO TABS: ORAL_TABLET | ORAL | 1 refills | Status: DC

## 2019-12-02 MED ORDER — ROSUVASTATIN CALCIUM 5 MG PO TABS: 5.0000 mg | ORAL_TABLET | Freq: Every day | ORAL | 1 refills | Status: DC

## 2019-12-02 NOTE — Telephone Encounter (Signed)
Wendy Marshall, Wendy Marshall are scheduled for a virtual visit with your provider today.    Just as we do with appointments in the office, we must obtain your consent to participate.  Your consent will be active for this visit and any virtual visit you may have with one of our providers in the next 365 days.    If you have a MyChart account, I can also send a copy of this consent to you electronically.  All virtual visits are billed to your insurance company just like a traditional visit in the office.  As this is a virtual visit, video technology does not allow for your provider to perform a traditional examination.  This may limit your provider's ability to fully assess your condition.  If your provider identifies any concerns that need to be evaluated in person or the need to arrange testing such as labs, EKG, etc, we will make arrangements to do so.    Although advances in technology are sophisticated, we cannot ensure that it will always work on either your end or our end.  If the connection with a video visit is poor, we may have to switch to a telephone visit.  With either a video or telephone visit, we are not always able to ensure that we have a secure connection.   I need to obtain your verbal consent now.   Are you willing to proceed with your visit today?   Wendy Marshall has provided verbal consent on 12/02/2019 for a virtual visit (video or telephone).   Dayton Bailiff, LPN [id number]  [id number]

## 2019-12-02 NOTE — Progress Notes (Signed)
Subjective:    Patient ID: Wendy Marshall, female    DOB: November 22, 1955, 64 y.o.   MRN: [id number]  Hyperlipidemia This is a chronic problem. Treatments tried: crestor 5mg . There are no compliance problems (takes meds every day, eats healthy, tries to exercise).    Having trouble with joint pain. Getting worse. Hands are numb and tingling. Arms feels heavy.  Patient relates a lot of heaviness in her arm some numbness and tingling into her hands she denies any severe neck pain but does have a history of neck fusion she also has a history of hip arthritis knee arthritis potentially will have knee surgery coming up within the next year  Patient is on anticoagulation long-term denies any bleeding issues recently had lab work  Virtual Visit via Telephone Note  I connected with Wendy Marshall on 12/02/19 at  9:00 AM EDT by telephone and verified that I am speaking with the correct person using two identifiers.  Location: Patient: home Provider: office   I discussed the limitations, risks, security and privacy concerns of performing an evaluation and management service by telephone and the availability of in person appointments. I also discussed with the patient that there may be a patient responsible charge related to this service. The patient expressed understanding and agreed to proceed.   History of Present Illness:    Observations/Objective:   Assessment and Plan:   Follow Up Instructions:    I discussed the assessment and treatment plan with the patient. The patient was provided an opportunity to ask questions and all were answered. The patient agreed with the plan and demonstrated an understanding of the instructions.   The patient was advised to call back or seek an in-person evaluation if the symptoms worsen or if the condition fails to improve as anticipated.  I provided 20 minutes of non-face-to-face time during this encounter.   Patient has had previous carpal  tunnel surgery patient has had nerve conduction studies in the past but none recently    Review of Systems Denies any type of chest tightness pressure pain relates some heaviness in her arms relates some intermittent numbness into the hands    Objective:   Physical Exam Today's visit was via telephone Physical exam was not possible for this visit   Labs reviewed with patient .Delilah Shan Results for orders placed or performed in visit on 11/21/19  Lipid Profile  Result Value Ref Range   Cholesterol, Total 171 100 - 199 mg/dL   Triglycerides 123 0 - 149 mg/dL   HDL 59 >39 mg/dL   VLDL Cholesterol Cal 22 5 - 40 mg/dL   LDL Chol Calc (NIH) 90 0 - 99 mg/dL   Chol/HDL Ratio 2.9 0.0 - 4.4 ratio  Hepatic function panel  Result Value Ref Range   Total Protein 7.4 6.0 - 8.5 g/dL   Albumin 4.5 3.8 - 4.8 g/dL   Bilirubin Total 0.5 0.0 - 1.2 mg/dL   Bilirubin, Direct 0.15 0.00 - 0.40 mg/dL   Alkaline Phosphatase 102 39 - 117 IU/L   AST 19 0 - 40 IU/L   ALT 11 0 - 32 IU/L  Basic Metabolic Panel (BMET)  Result Value Ref Range   Glucose 95 65 - 99 mg/dL   BUN 12 8 - 27 mg/dL   Creatinine, Ser 1.02 (H) 0.57 - 1.00 mg/dL   GFR calc non Af Amer 60 >59 mL/min/1.73   GFR calc Af Amer 69 >59 mL/min/1.73   BUN/Creatinine Ratio 12 12 -  28   Sodium 141 134 - 144 mmol/L   Potassium 4.4 3.5 - 5.2 mmol/L   Chloride 104 96 - 106 mmol/L   CO2 23 20 - 29 mmol/L   Calcium 9.5 8.7 - 10.3 mg/dL  TSH  Result Value Ref Range   TSH 1.540 0.450 - 4.500 uIU/mL  T4, free  Result Value Ref Range   Free T4 1.42 0.82 - 1.77 ng/dL  CBC with Differential  Result Value Ref Range   WBC 5.1 3.4 - 10.8 x10E3/uL   RBC 5.55 (H) 3.77 - 5.28 x10E6/uL   Hemoglobin 14.0 11.1 - 15.9 g/dL   Hematocrit 43.4 34.0 - 46.6 %   MCV 78 (L) 79 - 97 fL   MCH 25.2 (L) 26.6 - 33.0 pg   MCHC 32.3 31.5 - 35.7 g/dL   RDW 14.7 11.7 - 15.4 %   Platelets 273 150 - 450 x10E3/uL   Neutrophils 52 Not Estab. %   Lymphs 36 Not Estab. %    Monocytes 7 Not Estab. %   Eos 4 Not Estab. %   Basos 1 Not Estab. %   Neutrophils Absolute 2.7 1.4 - 7.0 x10E3/uL   Lymphocytes Absolute 1.8 0.7 - 3.1 x10E3/uL   Monocytes Absolute 0.3 0.1 - 0.9 x10E3/uL   EOS (ABSOLUTE) 0.2 0.0 - 0.4 x10E3/uL   Basophils Absolute 0.0 0.0 - 0.2 x10E3/uL   Immature Granulocytes 0 Not Estab. %   Immature Grans (Abs) 0.0 0.0 - 0.1 x10E3/uL        Assessment & Plan:  Hypothyroidism continue current measures refills given follow-up 6 months  Hyperlipidemia good control continue current measures  Intermittent numbness into the hands probably related to nerve impingement if worse patient should consider getting nerve conduction studies through her neurosurgeon she states that she will look into this  Gabapentin may go with 100 mg titrate up to 3 times a day give [place] feedback in a few weeks time how that is doing if drowsiness dizziness stop medicine

## 2020-01-03 ENCOUNTER — Other Ambulatory Visit (HOSPITAL_COMMUNITY): Payer: Self-pay | Admitting: *Deleted

## 2020-01-03 DIAGNOSIS — I2699 Other pulmonary embolism without acute cor pulmonale: Secondary | ICD-10-CM

## 2020-01-06 ENCOUNTER — Other Ambulatory Visit: Payer: Self-pay

## 2020-01-06 ENCOUNTER — Inpatient Hospital Stay (HOSPITAL_COMMUNITY): Payer: Medicare HMO | Attending: Hematology

## 2020-01-06 DIAGNOSIS — F329 Major depressive disorder, single episode, unspecified: Secondary | ICD-10-CM | POA: Diagnosis not present

## 2020-01-06 DIAGNOSIS — M7989 Other specified soft tissue disorders: Secondary | ICD-10-CM | POA: Diagnosis not present

## 2020-01-06 DIAGNOSIS — Z87891 Personal history of nicotine dependence: Secondary | ICD-10-CM | POA: Insufficient documentation

## 2020-01-06 DIAGNOSIS — R200 Anesthesia of skin: Secondary | ICD-10-CM | POA: Insufficient documentation

## 2020-01-06 DIAGNOSIS — E039 Hypothyroidism, unspecified: Secondary | ICD-10-CM | POA: Diagnosis not present

## 2020-01-06 DIAGNOSIS — R0602 Shortness of breath: Secondary | ICD-10-CM | POA: Insufficient documentation

## 2020-01-06 DIAGNOSIS — Z8249 Family history of ischemic heart disease and other diseases of the circulatory system: Secondary | ICD-10-CM | POA: Insufficient documentation

## 2020-01-06 DIAGNOSIS — Z79899 Other long term (current) drug therapy: Secondary | ICD-10-CM | POA: Diagnosis not present

## 2020-01-06 DIAGNOSIS — R002 Palpitations: Secondary | ICD-10-CM | POA: Diagnosis not present

## 2020-01-06 DIAGNOSIS — E7800 Pure hypercholesterolemia, unspecified: Secondary | ICD-10-CM | POA: Insufficient documentation

## 2020-01-06 DIAGNOSIS — I749 Embolism and thrombosis of unspecified artery: Secondary | ICD-10-CM | POA: Insufficient documentation

## 2020-01-06 DIAGNOSIS — Z888 Allergy status to other drugs, medicaments and biological substances status: Secondary | ICD-10-CM | POA: Diagnosis not present

## 2020-01-06 DIAGNOSIS — Z7901 Long term (current) use of anticoagulants: Secondary | ICD-10-CM | POA: Diagnosis not present

## 2020-01-06 DIAGNOSIS — R0781 Pleurodynia: Secondary | ICD-10-CM | POA: Insufficient documentation

## 2020-01-06 DIAGNOSIS — Z808 Family history of malignant neoplasm of other organs or systems: Secondary | ICD-10-CM | POA: Diagnosis not present

## 2020-01-06 DIAGNOSIS — Z86718 Personal history of other venous thrombosis and embolism: Secondary | ICD-10-CM | POA: Insufficient documentation

## 2020-01-06 DIAGNOSIS — R5383 Other fatigue: Secondary | ICD-10-CM | POA: Insufficient documentation

## 2020-01-06 DIAGNOSIS — Z800 Family history of malignant neoplasm of digestive organs: Secondary | ICD-10-CM | POA: Insufficient documentation

## 2020-01-06 DIAGNOSIS — R69 Illness, unspecified: Secondary | ICD-10-CM | POA: Diagnosis not present

## 2020-01-06 DIAGNOSIS — I2699 Other pulmonary embolism without acute cor pulmonale: Secondary | ICD-10-CM

## 2020-01-06 DIAGNOSIS — Z86711 Personal history of pulmonary embolism: Secondary | ICD-10-CM | POA: Diagnosis not present

## 2020-01-06 LAB — D-DIMER, QUANTITATIVE: D-Dimer, Quant: 0.66 ug/mL-FEU — ABNORMAL HIGH (ref 0.00–0.50)

## 2020-01-07 ENCOUNTER — Other Ambulatory Visit: Payer: Self-pay

## 2020-01-07 ENCOUNTER — Encounter (HOSPITAL_COMMUNITY): Payer: Self-pay

## 2020-01-07 ENCOUNTER — Other Ambulatory Visit (HOSPITAL_COMMUNITY): Payer: Self-pay | Admitting: *Deleted

## 2020-01-07 ENCOUNTER — Ambulatory Visit (HOSPITAL_COMMUNITY)
Admission: RE | Admit: 2020-01-07 | Discharge: 2020-01-07 | Disposition: A | Discharge status: Home / Self Care | Payer: Medicare HMO | Source: Ambulatory Visit | Attending: Nurse Practitioner | Admitting: Nurse Practitioner

## 2020-01-07 DIAGNOSIS — M79661 Pain in right lower leg: Secondary | ICD-10-CM | POA: Diagnosis not present

## 2020-01-07 DIAGNOSIS — I82459 Acute embolism and thrombosis of unspecified peroneal vein: Secondary | ICD-10-CM

## 2020-01-07 DIAGNOSIS — I2699 Other pulmonary embolism without acute cor pulmonale: Secondary | ICD-10-CM | POA: Insufficient documentation

## 2020-01-13 ENCOUNTER — Inpatient Hospital Stay (HOSPITAL_COMMUNITY): Payer: Medicare HMO

## 2020-01-13 ENCOUNTER — Inpatient Hospital Stay (HOSPITAL_BASED_OUTPATIENT_CLINIC_OR_DEPARTMENT_OTHER): Payer: Medicare HMO | Admitting: Hematology

## 2020-01-13 ENCOUNTER — Other Ambulatory Visit: Payer: Self-pay

## 2020-01-13 VITALS — BP 108/68 | HR 91 | Temp 97.1°F | Resp 19 | Wt 223.9 lb

## 2020-01-13 DIAGNOSIS — R002 Palpitations: Secondary | ICD-10-CM | POA: Diagnosis not present

## 2020-01-13 DIAGNOSIS — E7800 Pure hypercholesterolemia, unspecified: Secondary | ICD-10-CM | POA: Diagnosis not present

## 2020-01-13 DIAGNOSIS — R200 Anesthesia of skin: Secondary | ICD-10-CM | POA: Diagnosis not present

## 2020-01-13 DIAGNOSIS — R0602 Shortness of breath: Secondary | ICD-10-CM | POA: Diagnosis not present

## 2020-01-13 DIAGNOSIS — R0781 Pleurodynia: Secondary | ICD-10-CM | POA: Diagnosis not present

## 2020-01-13 DIAGNOSIS — I82459 Acute embolism and thrombosis of unspecified peroneal vein: Secondary | ICD-10-CM

## 2020-01-13 DIAGNOSIS — R5383 Other fatigue: Secondary | ICD-10-CM | POA: Diagnosis not present

## 2020-01-13 DIAGNOSIS — M7989 Other specified soft tissue disorders: Secondary | ICD-10-CM | POA: Diagnosis not present

## 2020-01-13 DIAGNOSIS — I749 Embolism and thrombosis of unspecified artery: Secondary | ICD-10-CM | POA: Diagnosis not present

## 2020-01-13 DIAGNOSIS — R69 Illness, unspecified: Secondary | ICD-10-CM | POA: Diagnosis not present

## 2020-01-13 DIAGNOSIS — E039 Hypothyroidism, unspecified: Secondary | ICD-10-CM | POA: Diagnosis not present

## 2020-01-13 LAB — CBC WITH DIFFERENTIAL/PLATELET
Abs Immature Granulocytes: 0.01 10*3/uL (ref 0.00–0.07)
Basophils Absolute: 0 10*3/uL (ref 0.0–0.1)
Basophils Relative: 0 %
Eosinophils Absolute: 0.1 10*3/uL (ref 0.0–0.5)
Eosinophils Relative: 2 %
HCT: 44.2 % (ref 36.0–46.0)
Hemoglobin: 13.8 g/dL (ref 12.0–15.0)
Immature Granulocytes: 0 %
Lymphocytes Relative: 35 %
Lymphs Abs: 2.7 10*3/uL (ref 0.7–4.0)
MCH: 26.4 pg (ref 26.0–34.0)
MCHC: 31.2 g/dL (ref 30.0–36.0)
MCV: 84.5 fL (ref 80.0–100.0)
Monocytes Absolute: 0.5 10*3/uL (ref 0.1–1.0)
Monocytes Relative: 7 %
Neutro Abs: 4.3 10*3/uL (ref 1.7–7.7)
Neutrophils Relative %: 56 %
Platelets: 262 10*3/uL (ref 150–400)
RBC: 5.23 MIL/uL — ABNORMAL HIGH (ref 3.87–5.11)
RDW: 14.4 % (ref 11.5–15.5)
WBC: 7.6 10*3/uL (ref 4.0–10.5)
nRBC: 0 % (ref 0.0–0.2)

## 2020-01-13 LAB — COMPREHENSIVE METABOLIC PANEL
ALT: 16 U/L (ref 0–44)
AST: 21 U/L (ref 15–41)
Albumin: 4.4 g/dL (ref 3.5–5.0)
Alkaline Phosphatase: 79 U/L (ref 38–126)
Anion gap: 9 (ref 5–15)
BUN: 11 mg/dL (ref 8–23)
CO2: 25 mmol/L (ref 22–32)
Calcium: 9.1 mg/dL (ref 8.9–10.3)
Chloride: 104 mmol/L (ref 98–111)
Creatinine, Ser: 0.92 mg/dL (ref 0.44–1.00)
GFR calc Af Amer: >60 mL/min (ref >60)
GFR calc non Af Amer: >60 mL/min (ref >60)
Glucose, Bld: 98 mg/dL (ref 70–99)
Potassium: 3.9 mmol/L (ref 3.5–5.1)
Sodium: 138 mmol/L (ref 135–145)
Total Bilirubin: 0.5 mg/dL (ref 0.3–1.2)
Total Protein: 8 g/dL (ref 6.5–8.1)

## 2020-01-13 MED ORDER — PREDNISONE 50 MG PO TABS: ORAL_TABLET | ORAL | 0 refills | Status: DC

## 2020-01-13 MED ORDER — DIPHENHYDRAMINE HCL 50 MG PO TABS: 50.0000 mg | ORAL_TABLET | Freq: Once | ORAL | 0 refills | Status: DC

## 2020-01-13 NOTE — Patient Instructions (Addendum)
[place] at [facility] Discharge Instructions  You were seen today by Dr. Delton Coombes. He went over your recent results. You will be scheduled for a CT scan of your chest. You will also be scheduled for an echocardiogram. Please continue your routine care with your primary care provider. Dr. Delton Coombes will call you back after your results are back.   Thank you for choosing [place] at [facility] to provide your oncology and hematology care.  To afford each patient quality time with our provider, please arrive at least 15 minutes before your scheduled appointment time.   If you have a lab appointment with the [facility] please come in thru the Main Entrance and check in at the main information desk  You need to re-schedule your appointment should you arrive 10 or more minutes late.  We strive to give you quality time with our providers, and arriving late affects you and other patients whose appointments are after yours.  Also, if you no show three or more times for appointments you may be dismissed from the clinic at the providers discretion.     Again, thank you for choosing [facility].  Our hope is that these requests will decrease the amount of time that you wait before being seen by our physicians.       _____________________________________________________________  Should you have questions after your visit to [facility], please contact our office at [phone] between the hours of 8:00 a.m. and 4:30 p.m.  Voicemails left after 4:00 p.m. will not be returned until the following business day.  For prescription refill requests, have your pharmacy contact our office and allow 72 hours.    Cancer Center Support Programs:   > Cancer Support Group  2nd Tuesday of the month 1pm-2pm, Journey Room

## 2020-01-13 NOTE — Progress Notes (Signed)
[place], [place] [address]   CLINIC:  Medical Oncology/Hematology  PCP:  [name], MD [address] [place] / [place] [address]  [phone]  REASON FOR VISIT:  Follow-up for recurrent thromboembolism  CURRENT THERAPY: Eliquis  INTERVAL HISTORY:  [name], a 64 y.o. female, returns for routine follow-up for her recurrent thromboembolism. Naiah was last seen on 12/31/2018.  Today she reports feeling well today. She is taking Eliquis daily and has only occasional bleeding from her gums, but no hematuria or hematochezia. Her SOB with exertion is stable. She reports occasional palpitations and has numbness in bilat forearms, hands and fingers after her spinal fusion. She reports that her R lower leg was swollen after waking up on Saturday, 01/11/2020, most likely due to walking on Friday, but it resolved by today.    REVIEW OF SYSTEMS:  Review of Systems  Constitutional: Positive for fatigue (moderate). Negative for appetite change.  Respiratory: Positive for shortness of breath (w/ exertion).   Cardiovascular: Positive for chest pain and palpitations.  Gastrointestinal: Negative for blood in stool.  Genitourinary: Negative for hematuria.   Musculoskeletal: Positive for back pain (6/10 upper back pain) and neck pain (6/10).  Neurological: Positive for numbness (hands).  Psychiatric/Behavioral: Positive for depression. The patient is nervous/anxious.   All other systems reviewed and are negative.   PAST MEDICAL/SURGICAL HISTORY:  Past Medical History:  Diagnosis Date  . Arthritis    "all over"  . Chondromalacia of knee, right 08/2017  . Complication of anesthesia    hard to wake up post-op  . Dental crowns present    x 2  . Difficulty swallowing pills    since cervical fusion  . Family history of adverse reaction to anesthesia    pt's mother has hx. of being hard to wake up post-op  . GERD (gastroesophageal  reflux disease)    no current med.  Marland Kitchen Headache    x 3 days (08/18/2017)  . High cholesterol   . History of pulmonary embolus (PE) 03/09/2016   bilateral  . Hypothyroidism   . Limited joint range of motion (ROM)    cervical spine - s/p fusion  . Medial meniscus tear 08/2017   right knee  . Sleep apnea    uses CPAP nightly   Past Surgical History:  Procedure Laterality Date  . ANTERIOR CERVICAL DECOMP/DISCECTOMY FUSION  07/18/2002   C5-6  . CARPAL TUNNEL RELEASE Bilateral   . CHONDROPLASTY Right 08/23/2017   Procedure: CHONDROPLASTY;  Surgeon: Frederik Pear, MD;  Location: [facility];  Service: Orthopedics;  Laterality: Right;  . EYE MUSCLE SURGERY Bilateral   . KNEE ARTHROSCOPY Right 08/23/2017   Procedure: ARTHROSCOPY RIGHT KNEE REMOVAL OF LOOSE BODIES;  Surgeon: Frederik Pear, MD;  Location: [facility];  Service: Orthopedics;  Laterality: Right;  . KNEE ARTHROSCOPY WITH MEDIAL MENISECTOMY Right 08/23/2017   Procedure: KNEE ARTHROSCOPY WITH MEDIAL MENISECTOMY;  Surgeon: Frederik Pear, MD;  Location: [facility];  Service: Orthopedics;  Laterality: Right;  . SHOULDER ARTHROSCOPY Left 06/11/2001  . TONSILLECTOMY     age 47  . TOTAL HIP ARTHROPLASTY Left 02/01/2016   Procedure: LEFT TOTAL HIP ARTHROPLASTY ANTERIOR APPROACH;  Surgeon: Paralee Cancel, MD;  Location: [facility];  Service: Orthopedics;  Laterality: Left;  Failed Spinal to LMA  . TUBAL LIGATION    . WISDOM TOOTH EXTRACTION      SOCIAL HISTORY:  Social  History   Socioeconomic History  . Marital status: Married    Spouse name: Not on file  . Number of children: Not on file  . Years of education: Not on file  . Highest education level: Not on file  Occupational History  . Occupation: Production  Tobacco Use  . Smoking status: Former Smoker    Packs/day: 0.00    Years: 11.00    Pack years: 0.00    Quit date: 07/31/1996    Years since quitting: 23.4  . Smokeless tobacco: Never  Used  Vaping Use  . Vaping Use: Never used  Substance and Sexual Activity  . Alcohol use: Yes    Comment: rarely  . Drug use: No  . Sexual activity: Yes    Birth control/protection: None, Post-menopausal, Surgical    Comment: tubal  Other Topics Concern  . Not on file  Social History Narrative  . Not on file   Social Determinants of Health   Financial Resource Strain:   . Difficulty of Paying Living Expenses:   Food Insecurity:   . Worried About Charity fundraiser in the Last Year:   . Arboriculturist in the Last Year:   Transportation Needs:   . Film/video editor (Medical):   Marland Kitchen Lack of Transportation (Non-Medical):   Physical Activity:   . Days of Exercise per Week:   . Minutes of Exercise per Session:   Stress:   . Feeling of Stress :   Social Connections:   . Frequency of Communication with Friends and Family:   . Frequency of Social Gatherings with Friends and Family:   . Attends Religious Services:   . Active Member of Clubs or Organizations:   . Attends Archivist Meetings:   Marland Kitchen Marital Status:   Intimate Partner Violence:   . Fear of Current or Ex-Partner:   . Emotionally Abused:   Marland Kitchen Physically Abused:   . Sexually Abused:     FAMILY HISTORY:  Family History  Problem Relation Age of Onset  . Hypertension Mother   . Anesthesia problems Mother        hard to wake up post-op  . Brain cancer Father   . Colon cancer Other   . Heart attack Other   . Throat cancer Other   . Colon cancer Paternal Grandmother   . Heart attack Maternal Grandfather   . Throat cancer Maternal Grandfather     CURRENT MEDICATIONS:  Current Outpatient Medications  Medication Sig Dispense Refill  . ELIQUIS 5 MG TABS tablet Take 1 tablet by mouth twice daily 180 tablet 0  . gabapentin (NEURONTIN) 100 MG capsule Take 1 capsule (100 mg total) by mouth 3 (three) times daily. 270 capsule 1  . levothyroxine (SYNTHROID) 112 MCG tablet Take one half tablet by mouth on  mondays and fridays and one whole tablet all other days 72 tablet 1  . loratadine (CLARITIN) 10 MG tablet Take 10 mg by mouth daily.    Marland Kitchen MAGNESIUM PO Take 50 mg by mouth daily.     . Multiple Vitamin (MULTIVITAMIN) tablet Take 1 tablet by mouth daily.    Marland Kitchen OVER THE COUNTER MEDICATION Vitamin d 1000 units daily    . rosuvastatin (CRESTOR) 5 MG tablet Take 1 tablet (5 mg total) by mouth daily. 90 tablet 1   No current facility-administered medications for this visit.    ALLERGIES:  Allergies  Allergen Reactions  . Penicillins Hives and Shortness Of Breath  .  Adhesive [Tape] Other (See Comments)    SKIN TEARS  . Contrast Media [Iodinated Diagnostic Agents] Hives  . Flexeril [Cyclobenzaprine] Other (See Comments)    AFFECTED HEART RATE  . Omnipaque [Iohexol] Hives  . Soma [Carisoprodol] Other (See Comments)    AFFECTED HEART RATE  . Metaxalone   . Methocarbamol   . Tizanidine     PHYSICAL EXAM:  Performance status (ECOG): 1 - Symptomatic but completely ambulatory  There were no vitals filed for this visit. Wt Readings from Last 3 Encounters:  12/02/19 219 lb (99.3 kg)  09/10/19 221 lb (100.2 kg)  08/27/19 219 lb (99.3 kg)   Physical Exam Vitals reviewed.  Constitutional:      Appearance: Normal appearance. She is obese.  Cardiovascular:     Rate and Rhythm: Normal rate and regular rhythm.     Pulses: Normal pulses.     Heart sounds: Normal heart sounds.  Pulmonary:     Effort: Pulmonary effort is normal.     Breath sounds: Normal breath sounds.  Abdominal:     Palpations: Abdomen is soft. There is no mass.     Tenderness: There is no abdominal tenderness.     Hernia: No hernia is present.  Musculoskeletal:     Right lower leg: No edema.     Left lower leg: No edema.  Neurological:     General: No focal deficit present.     Mental Status: She is alert and oriented to person, place, and time.  Psychiatric:        Mood and Affect: Mood normal.        Behavior:  Behavior normal.     LABORATORY DATA:  I have reviewed the labs as listed.  CBC Latest Ref Rng & Units 11/27/2019 08/30/2017 04/21/2016  WBC 3.4 - 10.8 x10E3/uL 5.1 6.7 6.3  Hemoglobin 11.1 - 15.9 g/dL 14.0 14.7 12.7  Hematocrit 34.0 - 46.6 % 43.4 44.7 39.0  Platelets 150 - 450 x10E3/uL 273 274 313.0   CMP Latest Ref Rng & Units 11/27/2019 04/17/2019 02/06/2019  Glucose 65 - 99 mg/dL 95 - 90  BUN 8 - 27 mg/dL 12 - -  Creatinine 0.57 - 1.00 mg/dL 1.02(H) - -  Sodium 134 - 144 mmol/L 141 - -  Potassium 3.5 - 5.2 mmol/L 4.4 - -  Chloride 96 - 106 mmol/L 104 - -  CO2 20 - 29 mmol/L 23 - -  Calcium 8.7 - 10.3 mg/dL 9.5 - -  Total Protein 6.0 - 8.5 g/dL 7.4 7.2 -  Total Bilirubin 0.0 - 1.2 mg/dL 0.5 0.5 -  Alkaline Phos 39 - 117 IU/L 102 88 -  AST 0 - 40 IU/L 19 19 -  ALT 0 - 32 IU/L 11 9 -      Component Value Date/Time   RBC 5.55 (H) 11/27/2019 1010   RBC 5.35 (H) 08/30/2017 1047   MCV 78 (L) 11/27/2019 1010   MCH 25.2 (L) 11/27/2019 1010   MCH 27.5 08/30/2017 1047   MCHC 32.3 11/27/2019 1010   MCHC 32.9 08/30/2017 1047   RDW 14.7 11/27/2019 1010   LYMPHSABS 1.8 11/27/2019 1010   MONOABS 0.5 04/21/2016 1542   EOSABS 0.2 11/27/2019 1010   BASOSABS 0.0 11/27/2019 1010    DIAGNOSTIC IMAGING:  I have independently reviewed the scans and discussed with the patient. US Venous Img Lower Unilateral Right  Result Date: 01/07/2020 CLINICAL DATA:  Right lower extremity pain for 4 days EXAM: RIGHT LOWER EXTREMITY VENOUS  DUPLEX ULTRASOUND TECHNIQUE: Gray-scale sonography with graded compression, as well as color Doppler and duplex ultrasound were performed to evaluate the right lower extremity deep venous system from the level of the common femoral vein and including the common femoral, femoral, profunda femoral, popliteal and calf veins including the posterior tibial, peroneal and gastrocnemius veins when visible. The superficial great saphenous vein was also interrogated. Spectral Doppler was  utilized to evaluate flow at rest and with distal augmentation maneuvers in the common femoral, femoral and popliteal veins. COMPARISON:  None. FINDINGS: Contralateral Common Femoral Vein: Respiratory phasicity is normal and symmetric with the symptomatic side. No evidence of thrombus. Normal compressibility. Common Femoral Vein: No evidence of thrombus. Normal compressibility, respiratory phasicity and response to augmentation. Saphenofemoral Junction: No evidence of thrombus. Normal compressibility and flow on color Doppler imaging. Profunda Femoral Vein: No evidence of thrombus. Normal compressibility and flow on color Doppler imaging. Femoral Vein: No evidence of thrombus. Normal compressibility, respiratory phasicity and response to augmentation. Popliteal Vein: No evidence of thrombus. Normal compressibility, respiratory phasicity and response to augmentation. Calf Veins: No evidence of thrombus. Normal compressibility and flow on color Doppler imaging. Superficial Great Saphenous Vein: No evidence of thrombus. Normal compressibility. Venous Reflux:  None. Other Findings:  None. IMPRESSION: No evidence of deep venous thrombosis in the right lower extremity. Left common femoral vein also patent. Electronically Signed   By: Lowella Grip III M.D.   On: 01/07/2020 13:30     ASSESSMENT:  1.  Recurrent thromboembolism: -Bilateral PE on 03/09/2016 after left hip replacement.  Dopplers were negative for DVT.  She was treated with Eliquis for a year. -Doppler on 11/08/2018 shows occlusive DVT involving left peroneal vein, new compared to Doppler from July 2017.  No extension to more proximal venous system. -Patient started on Eliquis 5 mg twice daily. -D-dimer elevated at 0.66 on 01/06/2020.  This was checked as she complained of right leg swelling. -Ultrasound of the right leg showed no evidence of DVT.  Left common femoral vein was patent.  2.  Family history: -Mother had brain tumor.  Maternal grandmother  had cervical cancer.  Paternal grandmother had colon cancer. -Maternal uncle had unprovoked DVT x2 in his 72s.   PLAN:  1.  Recurrent thromboembolism: -Most recent D-dimer is elevated.  We reviewed results of the right lower extremity Doppler on 01/07/2020 which was negative.  Today there is no swelling of either legs. -She reported slight worsening of dyspnea on exertion.  Also reported bilateral rib pain of the lateral ribs which has been there since her pulmonary embolism.  However this has slightly worsened. -I have recommended a CT scan of the chest PE protocol. -Given her shortness of breath, I have also recommended 2D echocardiogram. -We will premedicate her with prednisone prior to her CT scan as she had hives with it. -Continue Eliquis indefinitely as second episode was unprovoked.  Orders placed this encounter:  No orders of the defined types were placed in this encounter.  Control time spent is 30 minutes with more than 50% of the time spent face-to-face discussing treatment plan, reviewing results, counseling and coordination of care.  [name], MD [place] [phone]   I, Milinda Antis, am acting as a scribe for Dr. Sanda Linger.  I, [name] MD, have reviewed the above documentation for accuracy and completeness, and I agree with the above.

## 2020-01-15 ENCOUNTER — Telehealth (HOSPITAL_COMMUNITY): Payer: Medicare HMO | Admitting: Hematology

## 2020-01-15 ENCOUNTER — Ambulatory Visit (HOSPITAL_COMMUNITY): Admission: RE | Admit: 2020-01-15 | Payer: Medicare HMO | Source: Ambulatory Visit

## 2020-01-16 ENCOUNTER — Other Ambulatory Visit: Payer: Self-pay

## 2020-01-16 ENCOUNTER — Ambulatory Visit (HOSPITAL_COMMUNITY)
Admission: RE | Admit: 2020-01-16 | Discharge: 2020-01-16 | Disposition: A | Discharge status: Home / Self Care | Payer: Medicare HMO | Source: Ambulatory Visit | Attending: Hematology | Admitting: Hematology

## 2020-01-16 ENCOUNTER — Inpatient Hospital Stay (HOSPITAL_COMMUNITY): Payer: Medicare HMO | Admitting: Hematology

## 2020-01-16 ENCOUNTER — Encounter (HOSPITAL_COMMUNITY): Payer: Self-pay

## 2020-01-16 DIAGNOSIS — I82459 Acute embolism and thrombosis of unspecified peroneal vein: Secondary | ICD-10-CM | POA: Insufficient documentation

## 2020-01-16 DIAGNOSIS — R0602 Shortness of breath: Secondary | ICD-10-CM | POA: Diagnosis not present

## 2020-01-16 MED ORDER — IOHEXOL 350 MG/ML SOLN
75.0000 mL | Freq: Once | INTRAVENOUS | Status: AC | PRN
  Administered 2020-01-16: 75 mL via INTRAVENOUS

## 2020-01-22 ENCOUNTER — Ambulatory Visit (HOSPITAL_COMMUNITY)
Admission: RE | Admit: 2020-01-22 | Discharge: 2020-01-22 | Disposition: A | Discharge status: Home / Self Care | Payer: Medicare HMO | Source: Ambulatory Visit | Attending: Hematology | Admitting: Hematology

## 2020-01-22 ENCOUNTER — Other Ambulatory Visit: Payer: Self-pay

## 2020-01-22 DIAGNOSIS — G4733 Obstructive sleep apnea (adult) (pediatric): Secondary | ICD-10-CM | POA: Diagnosis not present

## 2020-01-22 DIAGNOSIS — I517 Cardiomegaly: Secondary | ICD-10-CM | POA: Diagnosis not present

## 2020-01-22 DIAGNOSIS — E7800 Pure hypercholesterolemia, unspecified: Secondary | ICD-10-CM | POA: Insufficient documentation

## 2020-01-22 DIAGNOSIS — R0602 Shortness of breath: Secondary | ICD-10-CM | POA: Diagnosis not present

## 2020-01-22 DIAGNOSIS — K219 Gastro-esophageal reflux disease without esophagitis: Secondary | ICD-10-CM | POA: Insufficient documentation

## 2020-01-22 DIAGNOSIS — E785 Hyperlipidemia, unspecified: Secondary | ICD-10-CM | POA: Insufficient documentation

## 2020-01-22 DIAGNOSIS — I82459 Acute embolism and thrombosis of unspecified peroneal vein: Secondary | ICD-10-CM

## 2020-01-22 DIAGNOSIS — Z86711 Personal history of pulmonary embolism: Secondary | ICD-10-CM | POA: Diagnosis not present

## 2020-01-22 NOTE — Progress Notes (Signed)
*  PRELIMINARY RESULTS* Echocardiogram 2D Echocardiogram has been performed.  Wendy Marshall 01/22/2020, 3:46 PM

## 2020-01-27 ENCOUNTER — Encounter (HOSPITAL_COMMUNITY): Payer: Self-pay

## 2020-01-28 ENCOUNTER — Other Ambulatory Visit (HOSPITAL_COMMUNITY): Payer: Self-pay | Admitting: *Deleted

## 2020-01-28 ENCOUNTER — Other Ambulatory Visit: Payer: Self-pay | Admitting: Family Medicine

## 2020-01-28 MED ORDER — APIXABAN 5 MG PO TABS: 5.0000 mg | ORAL_TABLET | Freq: Two times a day (BID) | ORAL | 3 refills | Status: DC

## 2020-03-07 ENCOUNTER — Other Ambulatory Visit: Payer: Self-pay | Admitting: Family Medicine

## 2020-05-25 DIAGNOSIS — R69 Illness, unspecified: Secondary | ICD-10-CM | POA: Diagnosis not present

## 2020-06-08 ENCOUNTER — Other Ambulatory Visit: Payer: Self-pay | Admitting: Family Medicine

## 2020-06-08 ENCOUNTER — Other Ambulatory Visit (HOSPITAL_COMMUNITY): Payer: Self-pay | Admitting: Hematology

## 2020-06-09 NOTE — Telephone Encounter (Signed)
Sent  Message through my chart to schedule appointment

## 2020-06-19 ENCOUNTER — Telehealth: Payer: Self-pay

## 2020-06-19 DIAGNOSIS — Z79899 Other long term (current) drug therapy: Secondary | ICD-10-CM

## 2020-06-19 DIAGNOSIS — Z131 Encounter for screening for diabetes mellitus: Secondary | ICD-10-CM

## 2020-06-19 DIAGNOSIS — E039 Hypothyroidism, unspecified: Secondary | ICD-10-CM

## 2020-06-19 DIAGNOSIS — E785 Hyperlipidemia, unspecified: Secondary | ICD-10-CM

## 2020-06-19 NOTE — Telephone Encounter (Signed)
Last labs for our office 11/27/19: lipid, liver, met 7, tsh, t4 and cbc

## 2020-06-19 NOTE — Telephone Encounter (Signed)
Lipid, liver, metabolic 7, TSH, free T4

## 2020-06-19 NOTE — Telephone Encounter (Signed)
Blood work ordered in Epic. Patient notified. 

## 2020-06-19 NOTE — Telephone Encounter (Signed)
Patient has 6 month follow up on 12/6 and needing labs done

## 2020-06-24 ENCOUNTER — Ambulatory Visit: Payer: Medicare HMO | Admitting: Family Medicine

## 2020-07-02 DIAGNOSIS — Z79899 Other long term (current) drug therapy: Secondary | ICD-10-CM | POA: Diagnosis not present

## 2020-07-02 DIAGNOSIS — E039 Hypothyroidism, unspecified: Secondary | ICD-10-CM | POA: Diagnosis not present

## 2020-07-02 DIAGNOSIS — E785 Hyperlipidemia, unspecified: Secondary | ICD-10-CM | POA: Diagnosis not present

## 2020-07-02 DIAGNOSIS — Z131 Encounter for screening for diabetes mellitus: Secondary | ICD-10-CM | POA: Diagnosis not present

## 2020-07-03 LAB — T4, FREE: Free T4: 1.25 ng/dL (ref 0.82–1.77)

## 2020-07-03 LAB — HEPATIC FUNCTION PANEL
ALT: 11 IU/L (ref 0–32)
AST: 19 IU/L (ref 0–40)
Albumin: 4.4 g/dL (ref 3.8–4.8)
Alkaline Phosphatase: 96 IU/L (ref 44–121)
Bilirubin Total: 0.4 mg/dL (ref 0.0–1.2)
Bilirubin, Direct: 0.11 mg/dL (ref 0.00–0.40)
Total Protein: 7.1 g/dL (ref 6.0–8.5)

## 2020-07-03 LAB — LIPID PANEL
Chol/HDL Ratio: 3.4 ratio (ref 0.0–4.4)
Cholesterol, Total: 181 mg/dL (ref 100–199)
HDL: 54 mg/dL (ref >39)
LDL Chol Calc (NIH): 103 mg/dL — ABNORMAL HIGH (ref 0–99)
Triglycerides: 136 mg/dL (ref 0–149)
VLDL Cholesterol Cal: 24 mg/dL (ref 5–40)

## 2020-07-03 LAB — BASIC METABOLIC PANEL
BUN/Creatinine Ratio: 10 — ABNORMAL LOW (ref 12–28)
BUN: 10 mg/dL (ref 8–27)
CO2: 22 mmol/L (ref 20–29)
Calcium: 9.3 mg/dL (ref 8.7–10.3)
Chloride: 106 mmol/L (ref 96–106)
Creatinine, Ser: 1.01 mg/dL — ABNORMAL HIGH (ref 0.57–1.00)
GFR calc Af Amer: 69 mL/min/{1.73_m2} (ref >59)
GFR calc non Af Amer: 60 mL/min/{1.73_m2} (ref >59)
Glucose: 85 mg/dL (ref 65–99)
Potassium: 4.5 mmol/L (ref 3.5–5.2)
Sodium: 142 mmol/L (ref 134–144)

## 2020-07-03 LAB — TSH: TSH: 2.34 u[IU]/mL (ref 0.450–4.500)

## 2020-07-06 ENCOUNTER — Encounter: Payer: Self-pay | Admitting: Family Medicine

## 2020-07-06 ENCOUNTER — Other Ambulatory Visit: Payer: Self-pay

## 2020-07-06 ENCOUNTER — Ambulatory Visit (INDEPENDENT_AMBULATORY_CARE_PROVIDER_SITE_OTHER): Payer: Medicare HMO | Admitting: Family Medicine

## 2020-07-06 VITALS — BP 120/82 | HR 97 | Temp 96.9°F | Wt 221.4 lb

## 2020-07-06 DIAGNOSIS — R200 Anesthesia of skin: Secondary | ICD-10-CM | POA: Diagnosis not present

## 2020-07-06 DIAGNOSIS — E785 Hyperlipidemia, unspecified: Secondary | ICD-10-CM | POA: Diagnosis not present

## 2020-07-06 DIAGNOSIS — M542 Cervicalgia: Secondary | ICD-10-CM

## 2020-07-06 DIAGNOSIS — E039 Hypothyroidism, unspecified: Secondary | ICD-10-CM | POA: Diagnosis not present

## 2020-07-06 DIAGNOSIS — M25542 Pain in joints of left hand: Secondary | ICD-10-CM | POA: Diagnosis not present

## 2020-07-06 DIAGNOSIS — R202 Paresthesia of skin: Secondary | ICD-10-CM

## 2020-07-06 DIAGNOSIS — M25541 Pain in joints of right hand: Secondary | ICD-10-CM | POA: Diagnosis not present

## 2020-07-06 DIAGNOSIS — Z23 Encounter for immunization: Secondary | ICD-10-CM

## 2020-07-06 DIAGNOSIS — R0789 Other chest pain: Secondary | ICD-10-CM

## 2020-07-06 MED ORDER — LEVOTHYROXINE SODIUM 112 MCG PO TABS
ORAL_TABLET | ORAL | 1 refills | Status: DC
Start: 2020-07-06 — End: 2020-08-14

## 2020-07-06 MED ORDER — ROSUVASTATIN CALCIUM 5 MG PO TABS
5.0000 mg | ORAL_TABLET | Freq: Every day | ORAL | 1 refills | Status: DC
Start: 2020-07-06 — End: 2020-12-24

## 2020-07-06 NOTE — Progress Notes (Signed)
Subjective:    Patient ID: Wendy Marshall, female    DOB: [birth year], 64 y.o.   MRN: [id number]  HPI Pt here for follow up on med. Pt doing well on meds. No concerns at this time.   Neck pain - Plan: Rheumatoid Factor, Sed Rate (ESR), C-reactive protein, Ambulatory referral to Neurology  Arthralgia of both hands - Plan: Rheumatoid Factor, Sed Rate (ESR), C-reactive protein, Ambulatory referral to Neurology  Hypothyroidism, unspecified type - Plan: Rheumatoid Factor, Sed Rate (ESR), C-reactive protein  Hyperlipidemia, unspecified hyperlipidemia type - Plan: Rheumatoid Factor, Sed Rate (ESR), C-reactive protein  Chest tightness - Plan: EKG 12-Lead  Need for vaccination - Plan: Flu Vaccine QUAD 6+ mos PF IM (Fluarix Quad PF), Tdap vaccine greater than or equal to 7yo IM  Patient with bilateral hand stiffness and discomfort worse in the morning hours often has to run warm water over them to get him to work relates at times feels like her joints are swollen.  Has had rheumatoid factor in the past which was negative  Has hypothyroidism takes her medicine regular basis lab work overall look good  Has hyperlipidemia takes her medicine watches diet recent lab work LDL slightly elevated  Patient with numbness and tingling into both arms worse on the left than the right will need nerve conduction studies  Patient with neck pain she has had previous surgery her neck neck surgeon would only see her if she has a up-to-date MRI she does not feel like that is necessary she uses Tylenol for discomfort  History of blood clots with DVT on Eliquis no bleeding issues Review of Systems  Constitutional: Negative for activity change, appetite change and fatigue.  HENT: Negative for congestion and rhinorrhea.   Respiratory: Negative for cough and shortness of breath.   Cardiovascular: Negative for chest pain and leg swelling.  Gastrointestinal: Negative for abdominal pain and diarrhea.  Endocrine:  Negative for polydipsia and polyphagia.  Skin: Negative for color change.  Neurological: Negative for dizziness and weakness.  Psychiatric/Behavioral: Negative for behavioral problems and confusion.       Objective:   Physical Exam Vitals reviewed.  Constitutional:      General: She is not in acute distress. HENT:     Head: Normocephalic and atraumatic.  Eyes:     General:        Right eye: No discharge.        Left eye: No discharge.  Neck:     Trachea: No tracheal deviation.  Cardiovascular:     Rate and Rhythm: Normal rate and regular rhythm.     Heart sounds: Normal heart sounds. No murmur heard.   Pulmonary:     Effort: Pulmonary effort is normal. No respiratory distress.     Breath sounds: Normal breath sounds.  Lymphadenopathy:     Cervical: No cervical adenopathy.  Skin:    General: Skin is warm and dry.  Neurological:     Mental Status: She is alert.     Coordination: Coordination normal.  Psychiatric:        Behavior: Behavior normal.    Results for orders placed or performed in visit on 06/19/20  Lipid panel  Result Value Ref Range   Cholesterol, Total 181 100 - 199 mg/dL   Triglycerides 136 0 - 149 mg/dL   HDL 54 >39 mg/dL   VLDL Cholesterol Cal 24 5 - 40 mg/dL   LDL Chol Calc (NIH) 103 (H) 0 - 99 mg/dL   Chol/HDL  Ratio 3.4 0.0 - 4.4 ratio  Hepatic function panel  Result Value Ref Range   Total Protein 7.1 6.0 - 8.5 g/dL   Albumin 4.4 3.8 - 4.8 g/dL   Bilirubin Total 0.4 0.0 - 1.2 mg/dL   Bilirubin, Direct 0.11 0.00 - 0.40 mg/dL   Alkaline Phosphatase 96 44 - 121 IU/L   AST 19 0 - 40 IU/L   ALT 11 0 - 32 IU/L  Basic metabolic panel  Result Value Ref Range   Glucose 85 65 - 99 mg/dL   BUN 10 8 - 27 mg/dL   Creatinine, Ser 1.01 (H) 0.57 - 1.00 mg/dL   GFR calc non Af Amer 60 >59 mL/min/1.73   GFR calc Af Amer 69 >59 mL/min/1.73   BUN/Creatinine Ratio 10 (L) 12 - 28   Sodium 142 134 - 144 mmol/L   Potassium 4.5 3.5 - 5.2 mmol/L   Chloride 106  96 - 106 mmol/L   CO2 22 20 - 29 mmol/L   Calcium 9.3 8.7 - 10.3 mg/dL  TSH  Result Value Ref Range   TSH 2.340 0.450 - 4.500 uIU/mL  T4, free  Result Value Ref Range   Free T4 1.25 0.82 - 1.77 ng/dL          Assessment & Plan:  1. Neck pain For now hold off on x-ray May use Tylenol as needed it is hard to know if the numbness into the left arm is coming from her neck or coming from carpal tunnel she has had previous carpal tunnel surgery will need nerve conduction studies - Rheumatoid Factor - Sed Rate (ESR) - C-reactive protein - Ambulatory referral to Neurology  2. Arthralgia of both hands Lab work ordered and use Tylenol currently warm water over her hands as needed - Rheumatoid Factor - Sed Rate (ESR) - C-reactive protein - Ambulatory referral to Neurology  3. Hypothyroidism, unspecified type Continue thyroid medication.  Refills given - Rheumatoid Factor - Sed Rate (ESR) - C-reactive protein  4. Hyperlipidemia, unspecified hyperlipidemia type Continue cholesterol medicines refills given watch diet stay active - Rheumatoid Factor - Sed Rate (ESR) - C-reactive protein  5. Chest tightness Occasional chest tightness comes and goes elevated D-dimer along with CT angio of the chest and echo back in December which were unremarkable EKG does not show any acute changes so therefore unlikely to be coronary artery disease based upon her symptoms and findings - EKG 12-Lead  6. Need for vaccination Flu vaccine Tdap today - Flu Vaccine QUAD 6+ mos PF IM (Fluarix Quad PF) - Tdap vaccine greater than or equal to 7yo IM  7. Numbness and tingling in left arm Intermittent numbness left arm will need nerve conduction studies await results-hard to know if this is due to neck trouble or due to carpal tunnel

## 2020-07-08 DIAGNOSIS — M25542 Pain in joints of left hand: Secondary | ICD-10-CM | POA: Diagnosis not present

## 2020-07-08 DIAGNOSIS — M25541 Pain in joints of right hand: Secondary | ICD-10-CM | POA: Diagnosis not present

## 2020-07-08 DIAGNOSIS — M542 Cervicalgia: Secondary | ICD-10-CM | POA: Diagnosis not present

## 2020-07-08 DIAGNOSIS — E039 Hypothyroidism, unspecified: Secondary | ICD-10-CM | POA: Diagnosis not present

## 2020-07-08 DIAGNOSIS — E785 Hyperlipidemia, unspecified: Secondary | ICD-10-CM | POA: Diagnosis not present

## 2020-07-09 LAB — C-REACTIVE PROTEIN: CRP: 8 mg/L (ref 0–10)

## 2020-07-09 LAB — SEDIMENTATION RATE: Sed Rate: 11 mm/hr (ref 0–40)

## 2020-07-09 LAB — RHEUMATOID FACTOR: Rheumatoid fact SerPl-aCnc: <10 IU/mL (ref <14.0)

## 2020-07-14 DIAGNOSIS — M546 Pain in thoracic spine: Secondary | ICD-10-CM | POA: Diagnosis not present

## 2020-07-14 DIAGNOSIS — M542 Cervicalgia: Secondary | ICD-10-CM | POA: Diagnosis not present

## 2020-07-14 DIAGNOSIS — M545 Low back pain, unspecified: Secondary | ICD-10-CM | POA: Diagnosis not present

## 2020-07-15 ENCOUNTER — Encounter: Payer: Self-pay | Admitting: Family Medicine

## 2020-07-17 DIAGNOSIS — Z96641 Presence of right artificial hip joint: Secondary | ICD-10-CM | POA: Diagnosis not present

## 2020-08-03 ENCOUNTER — Other Ambulatory Visit (HOSPITAL_COMMUNITY): Payer: Self-pay

## 2020-08-03 DIAGNOSIS — I2699 Other pulmonary embolism without acute cor pulmonale: Secondary | ICD-10-CM

## 2020-08-03 DIAGNOSIS — I82459 Acute embolism and thrombosis of unspecified peroneal vein: Secondary | ICD-10-CM

## 2020-08-04 ENCOUNTER — Other Ambulatory Visit: Payer: Self-pay

## 2020-08-04 ENCOUNTER — Inpatient Hospital Stay (HOSPITAL_COMMUNITY): Payer: Medicare HMO | Attending: Hematology

## 2020-08-04 DIAGNOSIS — F32A Depression, unspecified: Secondary | ICD-10-CM | POA: Insufficient documentation

## 2020-08-04 DIAGNOSIS — Z87891 Personal history of nicotine dependence: Secondary | ICD-10-CM | POA: Diagnosis not present

## 2020-08-04 DIAGNOSIS — M1990 Unspecified osteoarthritis, unspecified site: Secondary | ICD-10-CM | POA: Diagnosis not present

## 2020-08-04 DIAGNOSIS — Z7901 Long term (current) use of anticoagulants: Secondary | ICD-10-CM | POA: Insufficient documentation

## 2020-08-04 DIAGNOSIS — E669 Obesity, unspecified: Secondary | ICD-10-CM | POA: Insufficient documentation

## 2020-08-04 DIAGNOSIS — Z8249 Family history of ischemic heart disease and other diseases of the circulatory system: Secondary | ICD-10-CM | POA: Insufficient documentation

## 2020-08-04 DIAGNOSIS — M62830 Muscle spasm of back: Secondary | ICD-10-CM | POA: Diagnosis not present

## 2020-08-04 DIAGNOSIS — R079 Chest pain, unspecified: Secondary | ICD-10-CM | POA: Diagnosis not present

## 2020-08-04 DIAGNOSIS — Z800 Family history of malignant neoplasm of digestive organs: Secondary | ICD-10-CM | POA: Insufficient documentation

## 2020-08-04 DIAGNOSIS — R200 Anesthesia of skin: Secondary | ICD-10-CM | POA: Insufficient documentation

## 2020-08-04 DIAGNOSIS — Z801 Family history of malignant neoplasm of trachea, bronchus and lung: Secondary | ICD-10-CM | POA: Insufficient documentation

## 2020-08-04 DIAGNOSIS — R5383 Other fatigue: Secondary | ICD-10-CM | POA: Insufficient documentation

## 2020-08-04 DIAGNOSIS — E039 Hypothyroidism, unspecified: Secondary | ICD-10-CM | POA: Insufficient documentation

## 2020-08-04 DIAGNOSIS — I82459 Acute embolism and thrombosis of unspecified peroneal vein: Secondary | ICD-10-CM

## 2020-08-04 DIAGNOSIS — Z86711 Personal history of pulmonary embolism: Secondary | ICD-10-CM | POA: Diagnosis not present

## 2020-08-04 DIAGNOSIS — R69 Illness, unspecified: Secondary | ICD-10-CM | POA: Diagnosis not present

## 2020-08-04 DIAGNOSIS — Z79899 Other long term (current) drug therapy: Secondary | ICD-10-CM | POA: Insufficient documentation

## 2020-08-04 DIAGNOSIS — Z808 Family history of malignant neoplasm of other organs or systems: Secondary | ICD-10-CM | POA: Insufficient documentation

## 2020-08-04 DIAGNOSIS — R0781 Pleurodynia: Secondary | ICD-10-CM | POA: Insufficient documentation

## 2020-08-04 DIAGNOSIS — R0602 Shortness of breath: Secondary | ICD-10-CM | POA: Insufficient documentation

## 2020-08-04 DIAGNOSIS — R002 Palpitations: Secondary | ICD-10-CM | POA: Diagnosis not present

## 2020-08-04 DIAGNOSIS — Z880 Allergy status to penicillin: Secondary | ICD-10-CM | POA: Insufficient documentation

## 2020-08-04 DIAGNOSIS — I749 Embolism and thrombosis of unspecified artery: Secondary | ICD-10-CM | POA: Insufficient documentation

## 2020-08-04 DIAGNOSIS — Z8049 Family history of malignant neoplasm of other genital organs: Secondary | ICD-10-CM | POA: Insufficient documentation

## 2020-08-04 DIAGNOSIS — Z86718 Personal history of other venous thrombosis and embolism: Secondary | ICD-10-CM | POA: Diagnosis not present

## 2020-08-04 DIAGNOSIS — I2699 Other pulmonary embolism without acute cor pulmonale: Secondary | ICD-10-CM

## 2020-08-04 DIAGNOSIS — G479 Sleep disorder, unspecified: Secondary | ICD-10-CM | POA: Insufficient documentation

## 2020-08-04 DIAGNOSIS — Z888 Allergy status to other drugs, medicaments and biological substances status: Secondary | ICD-10-CM | POA: Diagnosis not present

## 2020-08-04 LAB — CBC WITH DIFFERENTIAL/PLATELET
Abs Immature Granulocytes: 0.02 10*3/uL (ref 0.00–0.07)
Basophils Absolute: 0.1 10*3/uL (ref 0.0–0.1)
Basophils Relative: 1 %
Eosinophils Absolute: 0.2 10*3/uL (ref 0.0–0.5)
Eosinophils Relative: 3 %
HCT: 43.8 % (ref 36.0–46.0)
Hemoglobin: 13.9 g/dL (ref 12.0–15.0)
Immature Granulocytes: 0 %
Lymphocytes Relative: 32 %
Lymphs Abs: 2.1 10*3/uL (ref 0.7–4.0)
MCH: 26.7 pg (ref 26.0–34.0)
MCHC: 31.7 g/dL (ref 30.0–36.0)
MCV: 84.1 fL (ref 80.0–100.0)
Monocytes Absolute: 0.5 10*3/uL (ref 0.1–1.0)
Monocytes Relative: 7 %
Neutro Abs: 3.8 10*3/uL (ref 1.7–7.7)
Neutrophils Relative %: 57 %
Platelets: 259 10*3/uL (ref 150–400)
RBC: 5.21 MIL/uL — ABNORMAL HIGH (ref 3.87–5.11)
RDW: 13.9 % (ref 11.5–15.5)
WBC: 6.7 10*3/uL (ref 4.0–10.5)
nRBC: 0 % (ref 0.0–0.2)

## 2020-08-04 LAB — COMPREHENSIVE METABOLIC PANEL
ALT: 14 U/L (ref 0–44)
AST: 18 U/L (ref 15–41)
Albumin: 3.9 g/dL (ref 3.5–5.0)
Alkaline Phosphatase: 76 U/L (ref 38–126)
Anion gap: 8 (ref 5–15)
BUN: 12 mg/dL (ref 8–23)
CO2: 27 mmol/L (ref 22–32)
Calcium: 9.2 mg/dL (ref 8.9–10.3)
Chloride: 107 mmol/L (ref 98–111)
Creatinine, Ser: 0.93 mg/dL (ref 0.44–1.00)
GFR, Estimated: >60 mL/min (ref >60)
Glucose, Bld: 119 mg/dL — ABNORMAL HIGH (ref 70–99)
Potassium: 4.1 mmol/L (ref 3.5–5.1)
Sodium: 142 mmol/L (ref 135–145)
Total Bilirubin: 0.5 mg/dL (ref 0.3–1.2)
Total Protein: 7.3 g/dL (ref 6.5–8.1)

## 2020-08-04 LAB — D-DIMER, QUANTITATIVE: D-Dimer, Quant: 0.42 ug/mL-FEU (ref 0.00–0.50)

## 2020-08-11 ENCOUNTER — Inpatient Hospital Stay (HOSPITAL_BASED_OUTPATIENT_CLINIC_OR_DEPARTMENT_OTHER): Payer: Medicare HMO | Admitting: Hematology

## 2020-08-11 ENCOUNTER — Other Ambulatory Visit: Payer: Self-pay

## 2020-08-11 ENCOUNTER — Encounter (HOSPITAL_COMMUNITY): Payer: Self-pay | Admitting: Hematology

## 2020-08-11 VITALS — BP 114/73 | HR 92 | Temp 97.3°F | Resp 18 | Wt 224.4 lb

## 2020-08-11 DIAGNOSIS — R5383 Other fatigue: Secondary | ICD-10-CM | POA: Diagnosis not present

## 2020-08-11 DIAGNOSIS — R0781 Pleurodynia: Secondary | ICD-10-CM | POA: Diagnosis not present

## 2020-08-11 DIAGNOSIS — R200 Anesthesia of skin: Secondary | ICD-10-CM | POA: Diagnosis not present

## 2020-08-11 DIAGNOSIS — R079 Chest pain, unspecified: Secondary | ICD-10-CM | POA: Diagnosis not present

## 2020-08-11 DIAGNOSIS — I82452 Acute embolism and thrombosis of left peroneal vein: Secondary | ICD-10-CM | POA: Diagnosis not present

## 2020-08-11 DIAGNOSIS — R0602 Shortness of breath: Secondary | ICD-10-CM | POA: Diagnosis not present

## 2020-08-11 DIAGNOSIS — E669 Obesity, unspecified: Secondary | ICD-10-CM | POA: Diagnosis not present

## 2020-08-11 DIAGNOSIS — M62830 Muscle spasm of back: Secondary | ICD-10-CM | POA: Diagnosis not present

## 2020-08-11 DIAGNOSIS — I749 Embolism and thrombosis of unspecified artery: Secondary | ICD-10-CM | POA: Diagnosis not present

## 2020-08-11 DIAGNOSIS — R002 Palpitations: Secondary | ICD-10-CM | POA: Diagnosis not present

## 2020-08-11 DIAGNOSIS — R69 Illness, unspecified: Secondary | ICD-10-CM | POA: Diagnosis not present

## 2020-08-11 NOTE — Patient Instructions (Addendum)
[place] at [facility] Discharge Instructions  You were seen today by Dr. Delton Coombes. He went over your recent results and scans. You may proceed with the back nerve block by stopping Eliquis for 2 days prior to the procedure and starting up again the night of or the day after the procedure. Dr. Delton Coombes will see you back in 1 year for labs and follow up.   Thank you for choosing [place] at [facility] to provide your oncology and hematology care.  To afford each patient quality time with our provider, please arrive at least 15 minutes before your scheduled appointment time.   If you have a lab appointment with the [facility] please come in thru the Main Entrance and check in at the main information desk  You need to re-schedule your appointment should you arrive 10 or more minutes late.  We strive to give you quality time with our providers, and arriving late affects you and other patients whose appointments are after yours.  Also, if you no show three or more times for appointments you may be dismissed from the clinic at the providers discretion.     Again, thank you for choosing [facility].  Our hope is that these requests will decrease the amount of time that you wait before being seen by our physicians.       _____________________________________________________________  Should you have questions after your visit to [facility], please contact our office at [phone] between the hours of 8:00 a.m. and 4:30 p.m.  Voicemails left after 4:00 p.m. will not be returned until the following business day.  For prescription refill requests, have your pharmacy contact our office and allow 72 hours.    Cancer Center Support Programs:   > Cancer Support Group  2nd Tuesday of the month 1pm-2pm, Journey Room

## 2020-08-11 NOTE — Progress Notes (Signed)
[facility] [address], [place] [address]   CLINIC:  Medical Oncology/Hematology  PCP:  Babs Sciara, MD [address] / [place] [address]  [phone]  REASON FOR VISIT:  Follow-up for recurrent thromboembolism  PRIOR THERAPY: None  CURRENT THERAPY: Eliquis 5 mg BID  INTERVAL HISTORY:  [name], a 65 y.o. female, returns for routine follow-up for her recurrent thromboembolism. Sundee was last seen on 01/13/2020.  Today she reports feeling well. She is taking Eliquis BID and is tolerating it well. She denies having any recent infections, F/C, nosebleeds, hematuria or hematochezia. She reports having occasional gum bleeds, especially when she brushes her teeth. She continues having SOB intermittently along with rib pain, though not as severe. She continues having mid-back spasms and numbness in her hands after her spinal surgery and is wondering if she can get a nerve block for the pain, similar to what she had previously. She denies having ankle swelling.   REVIEW OF SYSTEMS:  Review of Systems  Constitutional: Positive for fatigue (50%). Negative for appetite change, chills and fever.  HENT:   Negative for nosebleeds.   Respiratory: Positive for shortness of breath (w/ exertion).   Cardiovascular: Positive for chest pain and palpitations. Negative for leg swelling.  Gastrointestinal: Negative for blood in stool.  Genitourinary: Negative for hematuria.   Musculoskeletal: Positive for back pain (5/10 mid-back spasms).  Neurological: Positive for numbness (hands).  Psychiatric/Behavioral: Positive for depression and sleep disturbance. The patient is nervous/anxious.   All other systems reviewed and are negative.   PAST MEDICAL/SURGICAL HISTORY:  Past Medical History:  Diagnosis Date  . Arthritis    "all over"  . Chondromalacia of knee, right 08/2017  . Complication of anesthesia    hard to wake up post-op  . Dental  crowns present    x 2  . Difficulty swallowing pills    since cervical fusion  . Family history of adverse reaction to anesthesia    pt's mother has hx. of being hard to wake up post-op  . GERD (gastroesophageal reflux disease)    no current med.  Marland Kitchen Headache    x 3 days (08/18/2017)  . High cholesterol   . History of pulmonary embolus (PE) 03/09/2016   bilateral  . Hypothyroidism   . Limited joint range of motion (ROM)    cervical spine - s/p fusion  . Medial meniscus tear 08/2017   right knee  . Sleep apnea    uses CPAP nightly   Past Surgical History:  Procedure Laterality Date  . ANTERIOR CERVICAL DECOMP/DISCECTOMY FUSION  07/18/2002   C5-6  . CARPAL TUNNEL RELEASE Bilateral   . CHONDROPLASTY Right 08/23/2017   Procedure: CHONDROPLASTY;  Surgeon: Gean Birchwood, MD;  Location: [facility];  Service: Orthopedics;  Laterality: Right;  . EYE MUSCLE SURGERY Bilateral   . KNEE ARTHROSCOPY Right 08/23/2017   Procedure: ARTHROSCOPY RIGHT KNEE REMOVAL OF LOOSE BODIES;  Surgeon: Gean Birchwood, MD;  Location: [facility];  Service: Orthopedics;  Laterality: Right;  . KNEE ARTHROSCOPY WITH MEDIAL MENISECTOMY Right 08/23/2017   Procedure: KNEE ARTHROSCOPY WITH MEDIAL MENISECTOMY;  Surgeon: Gean Birchwood, MD;  Location: [facility];  Service: Orthopedics;  Laterality: Right;  . SHOULDER ARTHROSCOPY Left 06/11/2001  . TONSILLECTOMY     age 54  . TOTAL HIP ARTHROPLASTY Left 02/01/2016   Procedure: LEFT TOTAL HIP ARTHROPLASTY ANTERIOR APPROACH;  Surgeon: Durene Romans, MD;  Location: [facility];  Service: Orthopedics;  Laterality: Left;  Failed Spinal to LMA  . TUBAL LIGATION    . WISDOM TOOTH EXTRACTION      SOCIAL HISTORY:  Social History   Socioeconomic History  . Marital status: Married    Spouse name: Not on file  . Number of children: Not on file  . Years of education: Not on file  . Highest education level: Not on file  Occupational History  .  Occupation: Production  Tobacco Use  . Smoking status: Former Smoker    Packs/day: 0.00    Years: 11.00    Pack years: 0.00    Quit date: 07/31/1996    Years since quitting: 24.0  . Smokeless tobacco: Never Used  Vaping Use  . Vaping Use: Never used  Substance and Sexual Activity  . Alcohol use: Yes    Comment: rarely  . Drug use: No  . Sexual activity: Yes    Birth control/protection: None, Post-menopausal, Surgical    Comment: tubal  Other Topics Concern  . Not on file  Social History Narrative  . Not on file   Social Determinants of Health   Financial Resource Strain: Not on file  Food Insecurity: Not on file  Transportation Needs: Not on file  Physical Activity: Not on file  Stress: Not on file  Social Connections: Not on file  Intimate Partner Violence: Not on file    FAMILY HISTORY:  Family History  Problem Relation Age of Onset  . Hypertension Mother   . Anesthesia problems Mother        hard to wake up post-op  . Brain cancer Father   . Colon cancer Other   . Heart attack Other   . Throat cancer Other   . Colon cancer Paternal Grandmother   . Heart attack Maternal Grandfather   . Throat cancer Maternal Grandfather     CURRENT MEDICATIONS:  Current Outpatient Medications  Medication Sig Dispense Refill  . ELIQUIS 5 MG TABS tablet Take 1 tablet by mouth twice daily 60 tablet 6  . levothyroxine (SYNTHROID) 112 MCG tablet Take one half tablet by mouth on mondays and fridays and one whole tablet all other days 72 tablet 1  . MAGNESIUM PO Take 50 mg by mouth daily.     . Multiple Vitamin (MULTIVITAMIN) tablet Take 1 tablet by mouth daily.    Marland Kitchen OVER THE COUNTER MEDICATION Vitamin d 1000 units daily    . rosuvastatin (CRESTOR) 5 MG tablet Take 1 tablet (5 mg total) by mouth daily. 90 tablet 1   No current facility-administered medications for this visit.    ALLERGIES:  Allergies  Allergen Reactions  . Penicillins Hives and Shortness Of Breath  .  Adhesive [Tape] Other (See Comments)    SKIN TEARS  . Contrast Media [Iodinated Diagnostic Agents] Hives  . Flexeril [Cyclobenzaprine] Other (See Comments)    AFFECTED HEART RATE  . Omnipaque [Iohexol] Hives  . Soma [Carisoprodol] Other (See Comments)    AFFECTED HEART RATE  . Metaxalone   . Methocarbamol   . Other Other (See Comments)    Muscle relaxer- heart problems   . Tizanidine     PHYSICAL EXAM:  Performance status (ECOG): 1 - Symptomatic but completely ambulatory  Vitals:   08/11/20 1449  BP: 114/73  Pulse: 92  Resp: 18  Temp: (!) 97.3 F (36.3 C)  SpO2: 94%   Wt Readings from Last 3 Encounters:  08/11/20 224 lb 6.4 oz (101.8  kg)  07/06/20 221 lb 6.4 oz (100.4 kg)  01/13/20 223 lb 14.4 oz (101.6 kg)   Physical Exam Vitals reviewed.  Constitutional:      Appearance: Normal appearance. She is obese.  Cardiovascular:     Rate and Rhythm: Normal rate and regular rhythm.     Pulses: Normal pulses.     Heart sounds: Normal heart sounds.  Pulmonary:     Effort: Pulmonary effort is normal.     Breath sounds: Normal breath sounds.  Musculoskeletal:     Right lower leg: No edema.     Left lower leg: No edema.  Neurological:     General: No focal deficit present.     Mental Status: She is alert and oriented to person, place, and time.  Psychiatric:        Mood and Affect: Mood normal.        Behavior: Behavior normal.     LABORATORY DATA:  I have reviewed the labs as listed.  CBC Latest Ref Rng & Units 08/04/2020 01/13/2020 11/27/2019  WBC 4.0 - 10.5 K/uL 6.7 7.6 5.1  Hemoglobin 12.0 - 15.0 g/dL 13.9 13.8 14.0  Hematocrit 36.0 - 46.0 % 43.8 44.2 43.4  Platelets 150 - 400 K/uL 259 262 273   CMP Latest Ref Rng & Units 08/04/2020 07/02/2020 01/13/2020  Glucose 70 - 99 mg/dL 119(H) 85 98  BUN 8 - 23 mg/dL 12 10 11   Creatinine 0.44 - 1.00 mg/dL 0.93 1.01(H) 0.92  Sodium 135 - 145 mmol/L 142 142 138  Potassium 3.5 - 5.1 mmol/L 4.1 4.5 3.9  Chloride 98 - 111 mmol/L  107 106 104  CO2 22 - 32 mmol/L 27 22 25   Calcium 8.9 - 10.3 mg/dL 9.2 9.3 9.1  Total Protein 6.5 - 8.1 g/dL 7.3 7.1 8.0  Total Bilirubin 0.3 - 1.2 mg/dL 0.5 0.4 0.5  Alkaline Phos 38 - 126 U/L 76 96 79  AST 15 - 41 U/L 18 19 21   ALT 0 - 44 U/L 14 11 16       Component Value Date/Time   RBC 5.21 (H) 08/04/2020 1351   MCV 84.1 08/04/2020 1351   MCV 78 (L) 11/27/2019 1010   MCH 26.7 08/04/2020 1351   MCHC 31.7 08/04/2020 1351   RDW 13.9 08/04/2020 1351   RDW 14.7 11/27/2019 1010   LYMPHSABS 2.1 08/04/2020 1351   LYMPHSABS 1.8 11/27/2019 1010   MONOABS 0.5 08/04/2020 1351   EOSABS 0.2 08/04/2020 1351   EOSABS 0.2 11/27/2019 1010   BASOSABS 0.1 08/04/2020 1351   BASOSABS 0.0 11/27/2019 1010    DIAGNOSTIC IMAGING:  I have independently reviewed the scans and discussed with the patient. No results found.   ASSESSMENT:  1. Recurrent thromboembolism: -Bilateral PE on 03/09/2016 after left hip replacement.  Dopplers were negative for DVT.  She was treated with Eliquis for a year. -Doppler on 11/08/2018 shows occlusive DVT involving left peroneal vein, new compared to Doppler from July 2017.  No extension to more proximal venous system. -Patient started on Eliquis 5 mg twice daily. -D-dimer elevated at 0.66 on 01/06/2020.  This was checked as she complained of right leg swelling. -Ultrasound of the right leg on January 07, 2020 with no evidence of DVT.  Left common femoral vein is patent. - Repeat CT angiogram on January 16, 2020 for elevated D-dimer was negative for pulmonary embolism.  2.  Family history: -Mother had brain tumor.  Maternal grandmother had cervical cancer.  Paternal grandmother had colon  cancer. -Maternal uncle had unprovoked DVT x2 in his 58s.   PLAN:  1. Recurrent thromboembolism: -She is continuing Eliquis without any bleeding issues. Last 2D echo on January 22, 2020 shows EF 70-75%.  Grade 1 diastolic dysfunction. - Reviewed labs from August 04, 2020.  D-dimer is normal  at 0.42. - Because of her history of recurrent thromboembolism with second episode which was unprovoked, I have recommended indefinite anticoagulation. - We will reevaluate her in 1 year to assess risk-benefit ratio.  Orders placed this encounter:  No orders of the defined types were placed in this encounter.    [name], MD [place] [phone]   I, Milinda Antis, am acting as a scribe for Dr. Sanda Linger.  I, [name] MD, have reviewed the above documentation for accuracy and completeness, and I agree with the above.

## 2020-08-14 ENCOUNTER — Other Ambulatory Visit: Payer: Self-pay | Admitting: *Deleted

## 2020-08-14 ENCOUNTER — Encounter: Payer: Self-pay | Admitting: Family Medicine

## 2020-08-14 MED ORDER — LEVOTHYROXINE SODIUM 112 MCG PO TABS: ORAL_TABLET | ORAL | 0 refills | Status: DC

## 2020-09-07 DIAGNOSIS — D1801 Hemangioma of skin and subcutaneous tissue: Secondary | ICD-10-CM | POA: Diagnosis not present

## 2020-09-07 DIAGNOSIS — L814 Other melanin hyperpigmentation: Secondary | ICD-10-CM | POA: Diagnosis not present

## 2020-09-07 DIAGNOSIS — D2371 Other benign neoplasm of skin of right lower limb, including hip: Secondary | ICD-10-CM | POA: Diagnosis not present

## 2020-09-07 DIAGNOSIS — L821 Other seborrheic keratosis: Secondary | ICD-10-CM | POA: Diagnosis not present

## 2020-09-07 DIAGNOSIS — L720 Epidermal cyst: Secondary | ICD-10-CM | POA: Diagnosis not present

## 2020-09-07 DIAGNOSIS — L820 Inflamed seborrheic keratosis: Secondary | ICD-10-CM | POA: Diagnosis not present

## 2020-09-18 ENCOUNTER — Ambulatory Visit: Payer: Medicare HMO | Admitting: Diagnostic Neuroimaging

## 2020-09-29 ENCOUNTER — Ambulatory Visit: Payer: Medicare HMO | Admitting: Diagnostic Neuroimaging

## 2020-09-29 ENCOUNTER — Encounter: Payer: Self-pay | Admitting: Diagnostic Neuroimaging

## 2020-09-29 VITALS — BP 125/74 | HR 91 | Ht 64.0 in | Wt 222.8 lb

## 2020-09-29 DIAGNOSIS — M542 Cervicalgia: Secondary | ICD-10-CM | POA: Diagnosis not present

## 2020-09-29 DIAGNOSIS — M79602 Pain in left arm: Secondary | ICD-10-CM | POA: Diagnosis not present

## 2020-09-29 DIAGNOSIS — M79601 Pain in right arm: Secondary | ICD-10-CM
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
## 2020-09-29 NOTE — Patient Instructions (Signed)
-   follow up with orthopedic clinic to eval and treat cervical spine issues

## 2020-11-16 NOTE — Progress Notes (Signed)
Subjective:   Wendy Marshall is a 65 y.o. female who presents for an Initial Medicare Annual Wellness Visit.  Review of Systems    N/A  Cardiac Risk Factors include: advanced age (>95men, >53 women);dyslipidemia     Objective:    Today's Vitals   11/17/20 0812 11/17/20 0813  BP: 118/78   Pulse: 86   Temp: 98.4 F (36.9 C)   TempSrc: Oral   SpO2: 98%   Weight: 221 lb 8 oz (100.5 kg)   Height: 5\' 4"  (1.626 m)   PainSc:  6    Body mass index is 38.02 kg/m.  Advanced Directives 11/17/2020 08/11/2020 01/13/2020 12/31/2018 08/30/2017 08/23/2017 08/18/2017  Does Patient Have a Medical Advance Directive? Yes Yes Yes Yes Yes Yes Yes  Type of Paramedic of [place];Living will [place];Living will [place];Living will Living will;Healthcare Power of Attorney Living will - Living will;Healthcare Power of Attorney  Does patient want to make changes to medical advance directive? No - Patient declined No - Patient declined No - Patient declined No - Patient declined No - Patient declined No - Patient declined No - Patient declined  Copy of [place] in Chart? No - copy requested No - copy requested No - copy requested No - copy requested - - -  Would patient like information on creating a medical advance directive? - - - No - Patient declined - - -    Current Medications (verified) Outpatient Encounter Medications as of 11/17/2020  Medication Sig  . ELIQUIS 5 MG TABS tablet Take 1 tablet by mouth twice daily  . levothyroxine (SYNTHROID) 112 MCG tablet Take one half tablet by mouth on mondays and fridays and one whole tablet all other days  . MAGNESIUM PO Take 50 mg by mouth daily.   . Multiple Vitamin (MULTIVITAMIN) tablet Take 1 tablet by mouth daily.  Marland Kitchen OVER THE COUNTER MEDICATION Vitamin d 1000 units daily  . rosuvastatin (CRESTOR) 5 MG tablet Take 1 tablet (5 mg total) by mouth daily.   No  facility-administered encounter medications on file as of 11/17/2020.    Allergies (verified) Penicillins, Adhesive [tape], Contrast media [iodinated diagnostic agents], Flexeril [cyclobenzaprine], Omnipaque [iohexol], Soma [carisoprodol], Metaxalone, Methocarbamol, Other, and Tizanidine   History: Past Medical History:  Diagnosis Date  . Arthritis    "all over"  . Chondromalacia of knee, right 08/2017  . Complication of anesthesia    hard to wake up post-op  . Dental crowns present    x 2  . Difficulty swallowing pills    since cervical fusion  . Family history of adverse reaction to anesthesia    pt's mother has hx. of being hard to wake up post-op  . GERD (gastroesophageal reflux disease)    no current med.  Marland Kitchen Headache    x 3 days (08/18/2017)  . High cholesterol   . History of pulmonary embolus (PE) 03/09/2016   bilateral  . Hypothyroidism   . Limited joint range of motion (ROM)    cervical spine - s/p fusion  . Medial meniscus tear 08/2017   right knee  . Sleep apnea    uses CPAP nightly   Past Surgical History:  Procedure Laterality Date  . ANTERIOR CERVICAL DECOMP/DISCECTOMY FUSION  07/18/2002   C5-6  . CARPAL TUNNEL RELEASE Bilateral   . CHONDROPLASTY Right 08/23/2017   Procedure: CHONDROPLASTY;  Surgeon: Frederik Pear, MD;  Location: [place] [facility];  Service:  Orthopedics;  Laterality: Right;  . EYE MUSCLE SURGERY Bilateral   . KNEE ARTHROSCOPY Right 08/23/2017   Procedure: ARTHROSCOPY RIGHT KNEE REMOVAL OF LOOSE BODIES;  Surgeon: Frederik Pear, MD;  Location: [place];  Service: Orthopedics;  Laterality: Right;  . KNEE ARTHROSCOPY WITH MEDIAL MENISECTOMY Right 08/23/2017   Procedure: KNEE ARTHROSCOPY WITH MEDIAL MENISECTOMY;  Surgeon: Frederik Pear, MD;  Location: [facility];  Service: Orthopedics;  Laterality: Right;  . KNEE SURGERY Right 03/2019  . SHOULDER ARTHROSCOPY Left 06/11/2001  . TONSILLECTOMY     age 37  . TOTAL  HIP ARTHROPLASTY Left 02/01/2016   Procedure: LEFT TOTAL HIP ARTHROPLASTY ANTERIOR APPROACH;  Surgeon: Paralee Cancel, MD;  Location: [facility];  Service: Orthopedics;  Laterality: Left;  Failed Spinal to LMA  . TOTAL HIP ARTHROPLASTY Right 05/2019  . TUBAL LIGATION    . WISDOM TOOTH EXTRACTION     Family History  Problem Relation Age of Onset  . Hypertension Mother   . Anesthesia problems Mother        hard to wake up post-op  . Brain cancer Father   . Colon cancer Other   . Heart attack Other   . Throat cancer Other   . Colon cancer Paternal Grandmother   . Heart attack Maternal Grandfather   . Throat cancer Maternal Grandfather    Social History   Socioeconomic History  . Marital status: Married    Spouse name: [name]  . Number of children: 2  . Years of education: Not on file  . Highest education level: High school graduate  Occupational History  . Occupation: Surveyor, quantity    Comment: retired  Tobacco Use  . Smoking status: Former Smoker    Packs/day: 0.00    Years: 11.00    Pack years: 0.00    Quit date: 07/31/1996    Years since quitting: 24.3  . Smokeless tobacco: Never Used  Vaping Use  . Vaping Use: Never used  Substance and Sexual Activity  . Alcohol use: Yes    Comment: rarely  . Drug use: No  . Sexual activity: Yes    Birth control/protection: None, Post-menopausal, Surgical    Comment: tubal  Other Topics Concern  . Not on file  Social History Narrative   Lives with spouse   Social Determinants of Health   Financial Resource Strain: Low Risk   . Difficulty of Paying Living Expenses: Not hard at all  Food Insecurity: No Food Insecurity  . Worried About Charity fundraiser in the Last Year: Never true  . Ran Out of Food in the Last Year: Never true  Transportation Needs: No Transportation Needs  . Lack of Transportation (Medical): No  . Lack of Transportation (Non-Medical): No  Physical Activity: Inactive  . Days of Exercise per Week: 0 days  .  Minutes of Exercise per Session: 0 min  Stress: No Stress Concern Present  . Feeling of Stress : Only a little  Social Connections: Moderately Integrated  . Frequency of Communication with Friends and Family: More than three times a week  . Frequency of Social Gatherings with Friends and Family: Twice a week  . Attends Religious Services: More than 4 times per year  . Active Member of Clubs or Organizations: No  . Attends Archivist Meetings: Never  . Marital Status: Married    Tobacco Counseling Counseling given: Not Answered   Clinical Intake:  Pre-visit preparation completed: Yes  Pain : 0-10 Pain Score:  6  Pain Type: Chronic pain Pain Location: Neck (right shoulder) Pain Descriptors / Indicators: Burning (stinging) Pain Onset: More than a month ago Pain Frequency: Constant     Nutritional Risks: None Diabetes: No  How often do you need to have someone help you when you read instructions, pamphlets, or other written materials from your doctor or pharmacy?: 1 - Never What is the last grade level you completed in school?: High school  Diabetic?No   Interpreter Needed?: No  Information entered by :: [place] of Daily Living In your present state of health, do you have any difficulty performing the following activities: 11/17/2020  Hearing? N  Vision? Y  Walking or climbing stairs? Y  Dressing or bathing? N  Doing errands, shopping? N  Preparing Food and eating ? N  Using the Toilet? N  In the past six months, have you accidently leaked urine? N  Do you have problems with loss of bowel control? N  Managing your Medications? N  Managing your Finances? N  Housekeeping or managing your Housekeeping? N  Some recent data might be hidden    Patient Care Team: [name], MD as PCP - General (Family Medicine)  Indicate any recent Medical Services you may have received from other than [facility] providers in the past year (date may be  approximate).     Assessment:   This is a routine wellness examination for Wendy Marshall.  Hearing/Vision screen  Hearing Screening   125Hz  250Hz  500Hz  1000Hz  2000Hz  3000Hz  4000Hz  6000Hz  8000Hz   Right ear:           Left ear:           Vision Screening Comments: Has not had eye examined since 2016. Wears glasses   Dietary issues and exercise activities discussed: Current Exercise Habits: The patient does not participate in regular exercise at present, Exercise limited by: None identified  Goals    . Exercise 3x per week (30 min per time)    . Weight (lb) < 190 lb (86.2 kg)      Depression Screen PHQ 2/9 Scores 11/17/2020 07/06/2020 09/10/2019 08/27/2019  PHQ - 2 Score 0 2 0 1  PHQ- 9 Score - 6 - -    Fall Risk Fall Risk  11/17/2020 09/10/2019 08/27/2019  Falls in the past year? 0 0 0  Number falls in past yr: 0 0 0  Injury with Fall? 0 0 0  Risk for fall due to : No Fall Risks - -  Follow up Falls evaluation completed;Falls prevention discussed - Falls evaluation completed    FALL RISK PREVENTION PERTAINING TO THE HOME:  Any stairs in or around the home? Yes  If so, are there any without handrails? No  Home free of loose throw rugs in walkways, pet beds, electrical cords, etc? Yes  Adequate lighting in your home to reduce risk of falls? Yes   ASSISTIVE DEVICES UTILIZED TO PREVENT FALLS:  Life alert? No  Use of a cane, walker or w/c? No  Grab bars in the bathroom? No  Shower chair or bench in shower? No  Elevated toilet seat or a handicapped toilet? Yes   TIMED UP AND GO:  Was the test performed? Yes .  Length of time to ambulate 10 feet: 3 sec.   Gait steady and fast without use of assistive device  Cognitive Function:     Normal cognitive status assessed by direct observation by this Nurse Health Advisor. No abnormalities found.  Immunizations Immunization History  Administered Date(s) Administered  . Influenza Inj Mdck Quad Pf 04/25/2019  . Influenza,inj,Quad  PF,6+ Mos 05/02/2015, 07/06/2020  . Influenza-Unspecified 05/16/2016, 04/29/2019  . PFIZER(Purple Top)SARS-COV-2 Vaccination 10/31/2019, 11/22/2019, 08/12/2020  . Td 10/30/2006  . Tdap 07/06/2020    TDAP status: Up to date  Flu Vaccine status: Up to date  Pneumococcal vaccine status: Up to date  Covid-19 vaccine status: Completed vaccines  Qualifies for Shingles Vaccine? Yes   Zostavax completed No   Shingrix Completed?: No.    Education has been provided regarding the importance of this vaccine. Patient has been advised to call insurance company to determine out of pocket expense if they have not yet received this vaccine. Advised may also receive vaccine at local pharmacy or Health Dept. Verbalized acceptance and understanding.  Screening Tests Health Maintenance  Topic Date Due  . MAMMOGRAM  09/05/2019  . INFLUENZA VACCINE  03/01/2021  . PAP SMEAR-Modifier  09/09/2022  . COLONOSCOPY (Pts 45-75yrs Insurance coverage will need to be confirmed)  09/12/2024  . TETANUS/TDAP  07/06/2030  . COVID-19 Vaccine  Completed  . Hepatitis C Screening  Completed  . HIV Screening  Completed  . HPV VACCINES  Aged Out    Health Maintenance  Health Maintenance Due  Topic Date Due  . MAMMOGRAM  09/05/2019    Colorectal cancer screening: Type of screening: Colonoscopy. Completed 09/12/2014. Repeat every 10 years  Mammogram status: Ordered 11/17/2020. Pt provided with contact info and advised to call to schedule appt.   Bone Density status: Not due until age 68  Lung Cancer Screening: (Low Dose CT Chest recommended if Age 70-80 years, 30 pack-year currently smoking OR have quit w/in 15years.) does not qualify.   Lung Cancer Screening Referral: N/A   Additional Screening:  Hepatitis C Screening: does qualify; Completed 02/06/2019   Vision Screening: Recommended annual ophthalmology exams for early detection of glaucoma and other disorders of the eye. Is the patient up to date with  their annual eye exam?  No  Who is the provider or what is the name of the office in which the patient attends annual eye exams? Dr. Jorja Loa If pt is not established with a provider, would they like to be referred to a provider to establish care? No .   Dental Screening: Recommended annual dental exams for proper oral hygiene  Community Resource Referral / Chronic Care Management: CRR required this visit?  No   CCM required this visit?  No      Plan:     I have personally reviewed and noted the following in the patient's chart:   . Medical and social history . Use of alcohol, tobacco or illicit drugs  . Current medications and supplements . Functional ability and status . Nutritional status . Physical activity . Advanced directives . List of other physicians . Hospitalizations, surgeries, and ER visits in previous 12 months . Vitals . Screenings to include cognitive, depression, and falls . Referrals and appointments  In addition, I have reviewed and discussed with patient certain preventive protocols, quality metrics, and best practice recommendations. A written personalized care plan for preventive services as well as general preventive health recommendations were provided to patient.     Ofilia Neas, LPN   [id number]/62/0355   Nurse Notes: Patient currently having financial strains paying for her eliquis CCM referral placed. She informed me that she has not had her Eliquis in a 4 days due to her fixed income.

## 2020-11-17 ENCOUNTER — Ambulatory Visit (INDEPENDENT_AMBULATORY_CARE_PROVIDER_SITE_OTHER): Payer: Medicare HMO

## 2020-11-17 ENCOUNTER — Telehealth: Payer: Self-pay

## 2020-11-17 VITALS — BP 118/78 | HR 86 | Temp 98.4°F | Ht 64.0 in | Wt 221.5 lb

## 2020-11-17 DIAGNOSIS — Z1231 Encounter for screening mammogram for malignant neoplasm of breast: Secondary | ICD-10-CM

## 2020-11-17 DIAGNOSIS — Z0000 Encounter for general adult medical examination without abnormal findings: Secondary | ICD-10-CM

## 2020-11-17 DIAGNOSIS — Z596 Low income: Secondary | ICD-10-CM

## 2020-11-17 DIAGNOSIS — E785 Hyperlipidemia, unspecified: Secondary | ICD-10-CM | POA: Diagnosis not present

## 2020-11-17 DIAGNOSIS — I2699 Other pulmonary embolism without acute cor pulmonale: Secondary | ICD-10-CM | POA: Diagnosis not present

## 2020-11-17 NOTE — Patient Instructions (Signed)
[name] , Thank you for taking time to come for your Medicare Wellness Visit. I appreciate your ongoing commitment to your health goals. Please review the following plan we discussed and let me know if I can assist you in the future.   Screening recommendations/referrals: Colonoscopy: Up to date, next due 09/12/2024 Mammogram: Currently due, orders placed this visit Bone Density: Not due until 21  Recommended yearly ophthalmology/optometry visit for glaucoma screening and checkup Recommended yearly dental visit for hygiene and checkup  Vaccinations: Influenza vaccine: Up to date, next due fall 2022  Pneumococcal vaccine: Next due until age 16 Tdap vaccine: Up to date, next due 07/06/2030 Shingles vaccine: Currently due for shingrix, we recommend that you receive at your local pharmacy.     Advanced directives: Please bring in copies of your advanced medical directives so that we can scan into your chart.   Conditions/risks identified: None   Next appointment: 11/23/2021 @ 8:20 am with  [place] 40-64 Years, Female Preventive care refers to lifestyle choices and visits with your health care provider that can promote health and wellness. What does preventive care include?  A yearly physical exam. This is also called an annual well check.  Dental exams once or twice a year.  Routine eye exams. Ask your health care provider how often you should have your eyes checked.  Personal lifestyle choices, including:  Daily care of your teeth and gums.  Regular physical activity.  Eating a healthy diet.  Avoiding tobacco and drug use.  Limiting alcohol use.  Practicing safe sex.  Taking low-dose aspirin daily starting at age 90.  Taking vitamin and mineral supplements as recommended by your health care provider. What happens during an annual well check? The services and screenings done by your health care provider during your annual well check  will depend on your age, overall health, lifestyle risk factors, and family history of disease. Counseling  Your health care provider may ask you questions about your:  Alcohol use.  Tobacco use.  Drug use.  Emotional well-being.  Home and relationship well-being.  Sexual activity.  Eating habits.  Work and work Statistician.  Method of birth control.  Menstrual cycle.  Pregnancy history. Screening  You may have the following tests or measurements:  Height, weight, and BMI.  Blood pressure.  Lipid and cholesterol levels. These may be checked every 5 years, or more frequently if you are over 57 years old.  Skin check.  Lung cancer screening. You may have this screening every year starting at age 19 if you have a 30-pack-year history of smoking and currently smoke or have quit within the past 15 years.  Fecal occult blood test (FOBT) of the stool. You may have this test every year starting at age 2.  Flexible sigmoidoscopy or colonoscopy. You may have a sigmoidoscopy every 5 years or a colonoscopy every 10 years starting at age 92.  Hepatitis C blood test.  Hepatitis B blood test.  Sexually transmitted disease (STD) testing.  Diabetes screening. This is done by checking your blood sugar (glucose) after you have not eaten for a while (fasting). You may have this done every 1-3 years.  Mammogram. This may be done every 1-2 years. Talk to your health care provider about when you should start having regular mammograms. This may depend on whether you have a family history of breast cancer.  BRCA-related cancer screening. This may be done if you have a family history of  breast, ovarian, tubal, or peritoneal cancers.  Pelvic exam and Pap test. This may be done every 3 years starting at age 3. Starting at age 18, this may be done every 5 years if you have a Pap test in combination with an HPV test.  Bone density scan. This is done to screen for osteoporosis. You may  have this scan if you are at high risk for osteoporosis. Discuss your test results, treatment options, and if necessary, the need for more tests with your health care provider. Vaccines  Your health care provider may recommend certain vaccines, such as:  Influenza vaccine. This is recommended every year.  Tetanus, diphtheria, and acellular pertussis (Tdap, Td) vaccine. You may need a Td booster every 10 years.  Zoster vaccine. You may need this after age 80.  Pneumococcal 13-valent conjugate (PCV13) vaccine. You may need this if you have certain conditions and were not previously vaccinated.  Pneumococcal polysaccharide (PPSV23) vaccine. You may need one or two doses if you smoke cigarettes or if you have certain conditions. Talk to your health care provider about which screenings and vaccines you need and how often you need them. This information is not intended to replace advice given to you by your health care provider. Make sure you discuss any questions you have with your health care provider. Document Released: 08/14/2015 Document Revised: 04/06/2016 Document Reviewed: 05/19/2015 Elsevier Interactive Patient Education  2017 [place] Prevention in the Home Falls can cause injuries. They can happen to people of all ages. There are many things you can do to make your home safe and to help prevent falls. What can I do on the outside of my home?  Regularly fix the edges of walkways and driveways and fix any cracks.  Remove anything that might make you trip as you walk through a door, such as a raised step or threshold.  Trim any bushes or trees on the path to your home.  Use bright outdoor lighting.  Clear any walking paths of anything that might make someone trip, such as rocks or tools.  Regularly check to see if handrails are loose or broken. Make sure that both sides of any steps have handrails.  Any raised decks and porches should have guardrails on the  edges.  Have any leaves, snow, or ice cleared regularly.  Use sand or salt on walking paths during winter.  Clean up any spills in your garage right away. This includes oil or grease spills. What can I do in the bathroom?  Use night lights.  Install grab bars by the toilet and in the tub and shower. Do not use towel bars as grab bars.  Use non-skid mats or decals in the tub or shower.  If you need to sit down in the shower, use a plastic, non-slip stool.  Keep the floor dry. Clean up any water that spills on the floor as soon as it happens.  Remove soap buildup in the tub or shower regularly.  Attach bath mats securely with double-sided non-slip rug tape.  Do not have throw rugs and other things on the floor that can make you trip. What can I do in the bedroom?  Use night lights.  Make sure that you have a light by your bed that is easy to reach.  Do not use any sheets or blankets that are too big for your bed. They should not hang down onto the floor.  Have a firm chair that has  side arms. You can use this for support while you get dressed.  Do not have throw rugs and other things on the floor that can make you trip. What can I do in the kitchen?  Clean up any spills right away.  Avoid walking on wet floors.  Keep items that you use a lot in easy-to-reach places.  If you need to reach something above you, use a strong step stool that has a grab bar.  Keep electrical cords out of the way.  Do not use floor polish or wax that makes floors slippery. If you must use wax, use non-skid floor wax.  Do not have throw rugs and other things on the floor that can make you trip. What can I do with my stairs?  Do not leave any items on the stairs.  Make sure that there are handrails on both sides of the stairs and use them. Fix handrails that are broken or loose. Make sure that handrails are as long as the stairways.  Check any carpeting to make sure that it is firmly  attached to the stairs. Fix any carpet that is loose or worn.  Avoid having throw rugs at the top or bottom of the stairs. If you do have throw rugs, attach them to the floor with carpet tape.  Make sure that you have a light switch at the top of the stairs and the bottom of the stairs. If you do not have them, ask someone to add them for you. What else can I do to help prevent falls?  Wear shoes that:  Do not have high heels.  Have rubber bottoms.  Are comfortable and fit you well.  Are closed at the toe. Do not wear sandals.  If you use a stepladder:  Make sure that it is fully opened. Do not climb a closed stepladder.  Make sure that both sides of the stepladder are locked into place.  Ask someone to hold it for you, if possible.  Clearly mark and make sure that you can see:  Any grab bars or handrails.  First and last steps.  Where the edge of each step is.  Use tools that help you move around (mobility aids) if they are needed. These include:  Canes.  Walkers.  Scooters.  Crutches.  Turn on the lights when you go into a dark area. Replace any light bulbs as soon as they burn out.  Set up your furniture so you have a clear path. Avoid moving your furniture around.  If any of your floors are uneven, fix them.  If there are any pets around you, be aware of where they are.  Review your medicines with your doctor. Some medicines can make you feel dizzy. This can increase your chance of falling. Ask your doctor what other things that you can do to help prevent falls. This information is not intended to replace advice given to you by your health care provider. Make sure you discuss any questions you have with your health care provider. Document Released: 05/14/2009 Document Revised: 12/24/2015 Document Reviewed: 08/22/2014 Elsevier Interactive Patient Education  2017 [employer].

## 2020-11-17 NOTE — Progress Notes (Signed)
[place] [facility])  [place] Team    11/18/2020  Wendy Marshall 09-Dec-1955 [id number]  Reason for referral: Medication Assistance  Referral source: [name], Care Coordinator Current insurance: [employer] [facility]  PMHx includes but not limited to: Wendy Marshall is 22 YOF w/PMH significant for recurrent thromboembolisms (provoked and unprovoked) and will be on anticoagulant indefinite.  Outreach:  Successful telephone call with [name]. HIPAA identifiers verified using DOB and home address.   Subjective:  Per referral, patient has not taken Eliquis in 4 days. Household of 2 and estimated yearly income of $29,000. Per Pharmacy, Eliquis $37 for 30 - days supply. When I spoke to the patient she stated that every month she has to wait for her monthly check to pay for her medications. However, recently the timing of when her check deposits has not aligned with medication refills - which is why she was out of Eliquis for 4 days. She stated that tomorrow she gets paid and plans to pick up Eliquis. Each month, she delays paying her utility bills to afford medication (specifically Eliquis) - rosuvastatin and levothyroxine are $0. Additionally, patient stated the cost of her other medications are affordable. Overall, patient lives on a very tight budget and struggles to pay for ends meet based on our discussion.    D/w patient that pending provider preference, patient may have to be on warfarin bridged with Lovenox or switch to Xarelto in order to use free 51-monthtrial. Patient expressed bleeding risk concern warfarin, o/v frequency to check INR, and o/v cost ($25 to see a specialist). Patient felt that the o/v costs per month may exceed the monthly cost of Eliquis and wish to stick to Eliquis for now. Patient was educated about bleeding risk of warfarin compared to Eliquis and Xarelto.      Objective:   Lab Results  Component Value Date   CREATININE 0.93  08/04/2020   CREATININE 1.01 (H) 07/02/2020   CREATININE 0.92 01/13/2020    No results found for: HGBA1C  Lipid Panel     Component Value Date/Time   CHOL 181 07/02/2020 0831   TRIG 136 07/02/2020 0831   HDL 54 07/02/2020 0831   CHOLHDL 3.4 07/02/2020 0831   CHOLHDL 4.3 12/27/2013 0732   VLDL 27 12/27/2013 0732   LDLCALC 103 (H) 07/02/2020 0831    BP Readings from Last 3 Encounters:  11/17/20 118/78  09/29/20 125/74  08/11/20 114/73    Allergies  Allergen Reactions  . Penicillins Hives and Shortness Of Breath  . Adhesive [Tape] Other (See Comments)    SKIN TEARS  . Contrast Media [Iodinated Diagnostic Agents] Hives  . Flexeril [Cyclobenzaprine] Other (See Comments)    AFFECTED HEART RATE  . Omnipaque [Iohexol] Hives  . Soma [Carisoprodol] Other (See Comments)    AFFECTED HEART RATE  . Metaxalone   . Methocarbamol   . Other Other (See Comments)    Muscle relaxer- heart problems   . Tizanidine     Medications Reviewed Today    Reviewed by CLauna Grill LPN (Licensed Practical Nurse) on 11/17/20 at [phone] Med List Status: <None>  Medication Order Taking? Sig Documenting Provider Last Dose Status Informant  ELIQUIS 5 MG TABS tablet [id number] Take 1 tablet by mouth twice daily KDerek Jack MD Taking Active   levothyroxine (SYNTHROID) 112 MCG tablet [id number] Take one half tablet by mouth on mondays and fridays and one whole tablet all other days LKathyrn Drown MD  Taking Active   MAGNESIUM PO [id number] Yes Take 50 mg by mouth daily.  [provider] Taking Active Self  Multiple Vitamin (MULTIVITAMIN) tablet [id number] Yes Take 1 tablet by mouth daily. [provider] Taking Active Self  OVER THE COUNTER MEDICATION [id number] Yes Vitamin d 1000 units daily [provider] Taking Active   rosuvastatin (CRESTOR) 5 MG tablet [id number] Yes Take 1 tablet (5 mg total) by mouth daily. [name], MD Taking Active            Assessment: Patient is hesitant to be switched warfarin d/t o/v costs and bleeding risk. At this time, she still plans to pick up Eliquis tomorrow. Since this is urgent, patient will need anticoagulation as soon as possible.   Unfortunately, patient does not qualify for full/partial LIS based on reported income. Will see if patient qualifies for Eliquis PAP.   Medication Review Findings:  . Eliquis - $37 per 30 days; expensive for patient     Medication Assistance Findings:  Medication assistance needs identified: Eliquis Patient Assitance   Extra Help:  Not eligible for Extra Help Low Income Subsidy based on reported income and assets  Patient Assistance Programs: Eliquis made by [place] requirement met: Yes o Out-of-pocket prescription expenditure met:   Unknown - patient is unsure if she filled prescriptions equivalent to 3% income; awaiting information - Reviewed program requirements with patient.     Additional medication assistance options reviewed with patient as warranted:  No other options identified  Plan: . Per patient, she can barely afford Elquis. She gets paid tomorrow and will try to pick-up Eliquis. I explained to the patient the plan for warfarin and she verbalized her understanding of the lovenox bridge while starting warfarin. However, she expressed concern affording f/u appointments to manage warfarin. She feels that the cost for office visits > $47 copay.   . Secure messaged Dr. Wolfgang Phoenix. Per d/w PCP, there are no samples available at the clinic  . Secure messaged, inbasket messaged, and called Dr. Delton Coombes re: immediate need for anticoagulant. Per telephone d/w Dr. Delton Coombes, he does not have any samples and does not have access to trial coupon cards w/ Xarelto. If patient cannot afford to pick up medication tomorrow, the patient needs to be on an anticoagulant. The plan is to start warfarin and bridge with Lovenox injections in the  clinic pending inability to pick Eliquis tomorrow. I informed provider that I will f/u him tomorrow whether patient can afford to pick up Eliquis.  . Since patient is unsure if she mets the prescription criteria for PAP, will continue to look for other resources in the community.  . Will reach out to care coordinator to see if the patient may need financial assistance.  . Counseled patient on monitoring for signs/symptoms of VTE since patient has not been anticoagulated in four days - even after she has resumed taking Eliquis. Patient verbalized understanding and stated that she has been monitoring for sign of clots.   Thank you for allowing pharmacy to be a part of this patient's care.  Kristeen Miss, PharmD Clinical Pharmacist [place] Cell: [phone]

## 2020-11-19 ENCOUNTER — Telehealth: Payer: Self-pay

## 2020-11-19 ENCOUNTER — Encounter (HOSPITAL_COMMUNITY): Payer: Self-pay

## 2020-11-19 DIAGNOSIS — Z5986 Financial insecurity: Secondary | ICD-10-CM

## 2020-11-19 DIAGNOSIS — Z5989 Other problems related to housing and economic circumstances: Secondary | ICD-10-CM

## 2020-11-19 NOTE — Progress Notes (Signed)
Call placed to patient this morning who reports that she has gotten her Eliquis and is taking it. Dr. Delton Coombes made aware

## 2020-11-19 NOTE — Progress Notes (Signed)
[facility])  [place] Team    11/19/2020  Wendy Marshall 08-06-1955 [id number]  Reason for referral: Medication Assistance  Referral source: Wendy Marshall, Care Coordinator Current insurance: [employer] [facility]  PMHx includes but not limited to: Wendy Marshall is 28 YOF w/PMH significant for recurrent thromboembolisms (provoked and unprovoked) and will be on anticoagulant indefinite.  Outreach:  Successful telephone call with Wendy Marshall. HIPAA identifiers verified using DOB and home address.   Subjective:  Per referral, patient has not taken Eliquis in 4 days. Household of 2 and estimated yearly income of $29,000. After speaking with patient today, it is now my understanding that she regularly Marshall doses of Eliquis as a result of waiting for her income. However, the most recent incidence was the first time she missed multiple consecutive days.  At times time, patients has spent ~$486 on prescriptions and would like to move forwards with the application process. I explained to pt at she does not meet the [facility] prescription cost to qualify but may qualify in 1-2 months. Patient stated that she would still like to apply now for Eliquis PAP. Additionally, she inquired about her husband getting PAP for some of his medications. I have not personally spoke with spouse but Marshall will try to assist if the spouse is an appropriate candidate.     Objective:   Lab Results  Component Value Date   CREATININE 0.93 08/04/2020   CREATININE 1.01 (H) 07/02/2020   CREATININE 0.92 01/13/2020    No results found for: HGBA1C  Lipid Panel     Component Value Date/Time   CHOL 181 07/02/2020 0831   TRIG 136 07/02/2020 0831   HDL 54 07/02/2020 0831   CHOLHDL 3.4 07/02/2020 0831   CHOLHDL 4.3 12/27/2013 0732   VLDL 27 12/27/2013 0732   LDLCALC 103 (H) 07/02/2020 0831    BP Readings from Last 3 Encounters:  11/17/20 118/78  09/29/20 125/74  08/11/20  114/73    Allergies  Allergen Reactions  . Penicillins Hives and Shortness Of Breath  . Adhesive [Tape] Other (See Comments)    SKIN TEARS  . Contrast Media [Iodinated Diagnostic Agents] Hives  . Flexeril [Cyclobenzaprine] Other (See Comments)    AFFECTED HEART RATE  . Omnipaque [Iohexol] Hives  . Soma [Carisoprodol] Other (See Comments)    AFFECTED HEART RATE  . Metaxalone   . Methocarbamol   . Other Other (See Comments)    Muscle relaxer- heart problems   . Tizanidine     Medications Reviewed Today    Reviewed by Wendy Grill, LPN (Licensed Practical Nurse) on 11/17/20 at [phone]  Med List Status: <None>  Medication Order Taking? Sig Documenting Provider Last Dose Status Informant  ELIQUIS 5 MG TABS tablet [id number] Yes Take 1 tablet by mouth twice daily Wendy Jack, MD Taking Active   levothyroxine (SYNTHROID) 112 MCG tablet [id number] Yes Take one half tablet by mouth on mondays and fridays and one whole tablet all other days Wendy Drown, MD Taking Active   MAGNESIUM PO [id number] Yes Take 50 mg by mouth daily.  [provider] Taking Active Self  Multiple Vitamin (MULTIVITAMIN) tablet [id number] Yes Take 1 tablet by mouth daily. [provider] Taking Active Self  OVER THE COUNTER MEDICATION [id number] Yes Vitamin d 1000 units daily [provider] Taking Active   rosuvastatin (CRESTOR) 5 MG tablet [id number] Yes Take 1 tablet (5 mg total) by mouth daily. Wendy Drown, MD Taking Active  Assessment: Will need to reach out to care management to see if there are financial resources available to help with medication cost - to ensure patient meets OOP and is compliant with medication. Also, at this time the patient likely does not qualify for PAP approval this month but will begin application process per patient request.  Medication Review Findings:  . Eliquis - $37 per 30 days; expensive for patient     Medication Assistance  Findings:  Medication assistance needs identified: Eliquis Patient Assitance   Extra Help:  Not eligible for Extra Help Low Income Subsidy based on reported income and assets  Patient Assistance Programs: Eliquis made by [place] requirement met: Yes o Out-of-pocket prescription expenditure met:   No (patient has filled almost $500 and she need $870) - Reviewed program requirements with patient.     Additional medication assistance options reviewed with patient as warranted:  No other options identified  Plan: . Will reach out to care management re: financial resources. . Will forward application to pharmacy technician for patient assistance.  . Will f/u with patient in 2-3 weeks re: supply of Eliquis.   Thank you for allowing pharmacy to be a part of this patient's care.  Wendy Marshall, PharmD Clinical Pharmacist [place] Cell: [phone]

## 2020-11-24 ENCOUNTER — Telehealth: Payer: Self-pay | Admitting: Pharmacy Technician

## 2020-11-24 DIAGNOSIS — Z596 Low income: Secondary | ICD-10-CM

## 2020-11-24 NOTE — Progress Notes (Signed)
[place] [facility])                                            [place] Team    11/24/2020  KARLITA LICHTMAN [birth year] [id number]                                       Medication Assistance Referral  Referral From: [facility]  Medication/Company: Eliquis / BMS Patient application portion:  Mailed Provider application portion: Faxed  to Dr. [name] Provider address/fax verified via: Office website   Referral was sent via fax/Exchange by Launa Grill, LPN at Dr. Colleen Can office.   Meshulem Onorato P. Khambrel Amsden, [place]  [phone]

## 2020-12-03 DIAGNOSIS — M961 Postlaminectomy syndrome, not elsewhere classified: Secondary | ICD-10-CM | POA: Insufficient documentation

## 2020-12-03 DIAGNOSIS — M5412 Radiculopathy, cervical region: Secondary | ICD-10-CM | POA: Insufficient documentation

## 2020-12-07 ENCOUNTER — Encounter: Payer: Self-pay | Admitting: Physician Assistant

## 2020-12-07 ENCOUNTER — Telehealth: Payer: Medicare HMO | Admitting: Physician Assistant

## 2020-12-07 ENCOUNTER — Telehealth: Payer: Self-pay | Admitting: Family Medicine

## 2020-12-07 DIAGNOSIS — U071 COVID-19: Secondary | ICD-10-CM

## 2020-12-07 MED ORDER — BENZONATATE 100 MG PO CAPS: 100.0000 mg | ORAL_CAPSULE | Freq: Three times a day (TID) | ORAL | 0 refills | Status: DC | PRN

## 2020-12-07 NOTE — Patient Instructions (Signed)
Hello Wendy Marshall,  You are being placed in the home monitoring program for COVID-19 (commonly known as Coronavirus).  This is because you are suspected to have the virus or are known to have the virus.  If you are unsure which group you fall into call your clinic.    As part of this program, you'll answer a daily questionnaire in the MyChart mobile app. You'll receive a notification through the MyChart app when the questionnaire is available. When you log in to MyChart, you'll see the tasks in your To Do activity.       Clinicians will see any answers that are concerning and take appropriate steps.  If at any point you are having a medical emergency, call 911.  If otherwise concerned call your clinic instead of coming into the clinic or hospital.  To keep from spreading the disease you should: Stay home and limit contact with other people as much as possible.  Wash your hands frequently. Cover your coughs and sneezes with a tissue, and throw used tissues in the trash.   Clean and disinfect frequently touched surfaces and objects.    Take care of yourself by: Staying home Resting Drinking fluids Take fever-reducing medications (Tylenol/Acetaminophen and Ibuprofen)  For more information on the disease go to the Centers for Disease Control and Prevention website     Can take to lessen severity: Vit C 500mg  twice daily Quercertin 250-500mg  twice daily Zinc 75-100mg  daily Melatonin 3-6 mg at bedtime Vit D3 1000-2000 IU daily Aspirin 81 mg daily with food Optional: Famotidine 20mg  daily Also can add tylenol/ibuprofen as needed for fevers and body aches May add Mucinex or Mucinex DM as needed for cough/congestion   10 Things You Can Do to Manage Your COVID-19 Symptoms at Home If you have possible or confirmed COVID-19: 1. Stay home except to get medical care. 2. Monitor your symptoms carefully. If your symptoms get worse, call your healthcare provider immediately. 3. Get rest and stay  hydrated. 4. If you have a medical appointment, call the healthcare provider ahead of time and tell them that you have or may have COVID-19. 5. For medical emergencies, call 911 and notify the dispatch personnel that you have or may have COVID-19. 6. Cover your cough and sneezes with a tissue or use the inside of your elbow. 7. Wash your hands often with soap and water for at least 20 seconds or clean your hands with an alcohol-based hand sanitizer that contains at least 60% alcohol. 8. As much as possible, stay in a specific room and away from other people in your home. Also, you should use a separate bathroom, if available. If you need to be around other people in or outside of the home, wear a mask. 9. Avoid sharing personal items with other people in your household, like dishes, towels, and bedding. 10. Clean all surfaces that are touched often, like counters, tabletops, and doorknobs. Use household cleaning sprays or wipes according to the label instructions. June 02/14/2020 This information is not intended to replace advice given to you by your health care provider. Make sure you discuss any questions you have with your health care provider. Document Revised: 06/01/2020 Document Reviewed: 06/01/2020 Elsevier Patient Education  2021 [place]: What to Do if You Are Sick If you have a fever, cough or other symptoms, you might have COVID-19. Most people have mild illness and are able to recover at home. If you are sick:  Keep track of your  symptoms.  If you have an emergency warning sign (including trouble breathing), call 911. Steps to help prevent the spread of COVID-19 if you are sick If you are sick with COVID-19 or think you might have COVID-19, follow the steps below to care for yourself and to help protect other people in your home and community. Stay home except to get medical care  Stay home. Most people with COVID-19 have mild illness and can  recover at home without medical care. Do not leave your home, except to get medical care. Do not visit public areas.  Take care of yourself. Get rest and stay hydrated. Take over-the-counter medicines, such as acetaminophen, to help you feel better.  Stay in touch with your doctor. Call before you get medical care. Be sure to get care if you have trouble breathing, or have any other emergency warning signs, or if you think it is an emergency.  Avoid public transportation, ride-sharing, or taxis. Separate yourself from other people As much as possible, stay in a specific room and away from other people and pets in your home. If possible, you should use a separate bathroom. If you need to be around other people or animals in or outside of the home, wear a mask. Tell your close contactsthat they may have been exposed to COVID-19. An infected person can spread COVID-19 starting 48 hours (or 2 days) before the person has any symptoms or tests positive. By letting your close contacts know they may have been exposed to COVID-19, you are helping to protect everyone.  Additional guidance is available for those living in close quarters and shared housing.  See COVID-19 and Animals if you have questions about pets.  If you are diagnosed with COVID-19, someone from the health department may call you. Answer the call to slow the spread. Monitor your symptoms  Symptoms of COVID-19 include fever, cough, or other symptoms.  Follow care instructions from your healthcare provider and local health department. Your local health authorities may give instructions on checking your symptoms and reporting information. When to seek emergency medical attention Look for emergency warning signs* for COVID-19. If someone is showing any of these signs, seek emergency medical care immediately:  Trouble breathing  Persistent pain or pressure in the chest  New confusion  Inability to wake or stay awake  Pale, gray, or  blue-colored skin, lips, or nail beds, depending on skin tone *This list is not all possible symptoms. Please call your medical provider for any other symptoms that are severe or concerning to you. Call 911 or call ahead to your local emergency facility: Notify the operator that you are seeking care for someone who has or may have COVID-19. Call ahead before visiting your doctor  Call ahead. Many medical visits for routine care are being postponed or done by phone or telemedicine.  If you have a medical appointment that cannot be postponed, call your doctor's office, and tell them you have or may have COVID-19. This will help the office protect themselves and other patients. Get  tested  If you have symptoms of COVID-19, get tested. While waiting for test results, you stay away from others, including staying apart from those living in your household.  You can visit your state, tribal, local, and territorialhealth department's website to look for the latest local information on testing sites. If you are sick, wear a mask over your nose and mouth  You should wear a mask over your nose and mouth if you must  be around other people or animals, including pets (even at home).  You don't need to wear the mask if you are alone. If you can't put on a mask (because of trouble breathing, for example), cover your coughs and sneezes in some other way. Try to stay at least 6 feet away from other people. This will help protect the people around you.  Masks should not be placed on young children under age 53 years, anyone who has trouble breathing, or anyone who is not able to remove the mask without help. Note: During the COVID-19 pandemic, medical grade facemasks are reserved for healthcare workers and some first responders. Cover your coughs and sneezes  Cover your mouth and nose with a tissue when you cough or sneeze.  Throw away used tissues in a lined trash can.  Immediately wash your hands with soap  and water for at least 20 seconds. If soap and water are not available, clean your hands with an alcohol-based hand sanitizer that contains at least 60% alcohol. Clean your hands often  Wash your hands often with soap and water for at least 20 seconds. This is especially important after blowing your nose, coughing, or sneezing; going to the bathroom; and before eating or preparing food.  Use hand sanitizer if soap and water are not available. Use an alcohol-based hand sanitizer with at least 60% alcohol, covering all surfaces of your hands and rubbing them together until they feel dry.  Soap and water are the best option, especially if hands are visibly dirty.  Avoid touching your eyes, nose, and mouth with unwashed hands.  Handwashing Tips Avoid sharing personal household items  Do not share dishes, drinking glasses, cups, eating utensils, towels, or bedding with other people in your home.  Wash these items thoroughly after using them with soap and water or put in the dishwasher. Clean all "high-touch" surfaces everyday  Clean and disinfect high-touch surfaces in your "sick room" and bathroom; wear disposable gloves. Let someone else clean and disinfect surfaces in common areas, but you should clean your bedroom and bathroom, if possible.  If a caregiver or other person needs to clean and disinfect a sick person's bedroom or bathroom, they should do so on an as-needed basis. The caregiver/other person should wear a mask and disposable gloves prior to cleaning. They should wait as long as possible after the person who is sick has used the bathroom before coming in to clean and use the bathroom. ? High-touch surfaces include phones, remote controls, counters, tabletops, doorknobs, bathroom fixtures, toilets, keyboards, tablets, and bedside tables.  Clean and disinfect areas that may have blood, stool, or body fluids on them.  Use household cleaners and disinfectants. Clean the area or item  with soap and water or another detergent if it is dirty. Then, use a household disinfectant. ? Be sure to follow the instructions on the label to ensure safe and effective use of the product. Many products recommend keeping the surface wet for several minutes to ensure germs are killed. Many also recommend precautions such as wearing gloves and making sure you have good ventilation during use of the product. ? Use a product from [employer] List N: Disinfectants for Coronavirus (URKYH-06). ? Complete Disinfection Guidance When you can be around others after being sick with COVID-19 Deciding when you can be around others is different for different situations. Find out when you can safely end home isolation. For any additional questions about your care, contact your healthcare provider or state  or local health department. 10/16/2019 Content source: [facility] for Immunization and Respiratory Diseases ([facility]), Division of Viral Diseases This information is not intended to replace advice given to you by your health care provider. Make sure you discuss any questions you have with your health care provider. Document Revised: 06/01/2020 Document Reviewed: 06/01/2020 Elsevier Patient Education  2021 [employer].

## 2020-12-07 NOTE — Telephone Encounter (Signed)
Pt filled out COVID questionnaire and stated her cough was worse. I looked in record and she saw MD today and was given Tessalon Perles. Questioned pt on the medication and if it was not helping enough. States that she has not started them yet even though they are three times a day. She states she took cold medication at 10am and was afraid to take new medication since already had other OTC med in her system. Advised her to start [employer] and use as directed. If at that point she is still getting no relief to call back to ask about adding OTC with it. Verbalized understanding.

## 2020-12-07 NOTE — Progress Notes (Signed)
Ms. lyan, holck are scheduled for a virtual visit with your provider today.    Just as we do with appointments in the office, we must obtain your consent to participate.  Your consent will be active for this visit and any virtual visit you may have with one of our providers in the next 365 days.    If you have a MyChart account, I can also send a copy of this consent to you electronically.  All virtual visits are billed to your insurance company just like a traditional visit in the office.  As this is a virtual visit, video technology does not allow for your provider to perform a traditional examination.  This may limit your provider's ability to fully assess your condition.  If your provider identifies any concerns that need to be evaluated in person or the need to arrange testing such as labs, EKG, etc, we will make arrangements to do so.    Although advances in technology are sophisticated, we cannot ensure that it will always work on either your end or our end.  If the connection with a video visit is poor, we may have to switch to a telephone visit.  With either a video or telephone visit, we are not always able to ensure that we have a secure connection.   I need to obtain your verbal consent now.   Are you willing to proceed with your visit today?   Wendy Marshall has provided verbal consent on 12/07/2020 for a virtual visit (video or telephone).   Mar Daring, PA-C 12/07/2020  9:25 AM     MyChart Video Visit    Virtual Visit via Video Note   This visit type was conducted due to national recommendations for restrictions regarding the COVID-19 Pandemic (e.g. social distancing) in an effort to limit this patient's exposure and mitigate transmission in our community. This patient is at least at moderate risk for complications without adequate follow up. This format is felt to be most appropriate for this patient at this time. Physical exam was limited by quality of the video and  audio technology used for the visit.   Patient location: Home Provider location: Home office in [place]  I discussed the limitations of evaluation and management by telemedicine and the availability of in person appointments. The patient expressed understanding and agreed to proceed.  Patient: Wendy Marshall   DOB: 01-Feb-1956   65 y.o. Female  MRN: [id number] Visit Date: 12/07/2020  Today's healthcare provider: Mar Daring, PA-C   No chief complaint on file.  Subjective    URI  This is a new problem. The current episode started yesterday. The problem has been gradually worsening. The maximum temperature recorded prior to her arrival was 100.4 - 100.9 F. The fever has been present for less than 1 day. Associated symptoms include chest pain (yesterday), congestion, coughing (slightly productive), ear pain, headaches, nausea, rhinorrhea, sinus pain, sneezing and a sore throat. Pertinent negatives include no abdominal pain or wheezing. She has tried acetaminophen, decongestant and increased fluids (Nyquil) for the symptoms. The treatment provided no relief.    Covid 19 positive on home test last night  Patient Active Problem List   Diagnosis Date Noted  . Screening for colorectal cancer 09/10/2019  . Encounter for gynecological examination with Papanicolaou smear of cervix 09/10/2019  . Pelvic pressure in female 08/27/2019  . Left lower quadrant abdominal tenderness without rebound tenderness 08/27/2019  . Acute medial meniscus tear of right knee  08/22/2017  . Vitamin D deficiency 02/20/2017  . Hyperlipidemia 02/20/2017  . OSA on CPAP 10/20/2016  . Nonspecific chest pain 10/20/2016  . Dyspnea 04/21/2016  . Hypersomnia with sleep apnea 04/11/2016  . Pulmonary embolism ([place]) 03/09/2016  . Leukocytosis 03/09/2016  . Fever 03/09/2016  . Status post total replacement of left hip 02/01/2016  . Left-sided low back pain with left-sided sciatica 08/06/2015  . Hypothyroidism  08/05/2013  . Fibromyalgia 08/05/2013   Past Medical History:  Diagnosis Date  . Arthritis    "all over"  . Chondromalacia of knee, right 08/2017  . Complication of anesthesia    hard to wake up post-op  . Dental crowns present    x 2  . Difficulty swallowing pills    since cervical fusion  . Family history of adverse reaction to anesthesia    pt's mother has hx. of being hard to wake up post-op  . GERD (gastroesophageal reflux disease)    no current med.  Marland Kitchen Headache    x 3 days (08/18/2017)  . High cholesterol   . History of pulmonary embolus (PE) 03/09/2016   bilateral  . Hypothyroidism   . Limited joint range of motion (ROM)    cervical spine - s/p fusion  . Medial meniscus tear 08/2017   right knee  . Sleep apnea    uses CPAP nightly      Medications: Outpatient Medications Prior to Visit  Medication Sig  . ELIQUIS 5 MG TABS tablet Take 1 tablet by mouth twice daily  . levothyroxine (SYNTHROID) 112 MCG tablet Take one half tablet by mouth on mondays and fridays and one whole tablet all other days  . MAGNESIUM PO Take 50 mg by mouth daily.   . Multiple Vitamin (MULTIVITAMIN) tablet Take 1 tablet by mouth daily.  Marland Kitchen OVER THE COUNTER MEDICATION Vitamin d 1000 units daily  . rosuvastatin (CRESTOR) 5 MG tablet Take 1 tablet (5 mg total) by mouth daily.   No facility-administered medications prior to visit.    Review of Systems  Constitutional: Positive for appetite change, fatigue and fever.  HENT: Positive for congestion, ear pain, postnasal drip, rhinorrhea, sinus pain, sneezing and sore throat.   Respiratory: Positive for cough (slightly productive) and chest tightness. Negative for shortness of breath and wheezing.   Cardiovascular: Positive for chest pain (yesterday).  Gastrointestinal: Positive for nausea. Negative for abdominal pain.  Neurological: Positive for headaches.    Last CBC Lab Results  Component Value Date   WBC 6.7 08/04/2020   HGB 13.9  08/04/2020   HCT 43.8 08/04/2020   MCV 84.1 08/04/2020   MCH 26.7 08/04/2020   RDW 13.9 08/04/2020   PLT 259 51/09/5850   Last metabolic panel Lab Results  Component Value Date   GLUCOSE 119 (H) 08/04/2020   NA 142 08/04/2020   K 4.1 08/04/2020   CL 107 08/04/2020   CO2 27 08/04/2020   BUN 12 08/04/2020   CREATININE 0.93 08/04/2020   GFRNONAA >60 08/04/2020   GFRAA 69 07/02/2020   CALCIUM 9.2 08/04/2020   PROT 7.3 08/04/2020   ALBUMIN 3.9 08/04/2020   BILITOT 0.5 08/04/2020   ALKPHOS 76 08/04/2020   AST 18 08/04/2020   ALT 14 08/04/2020   ANIONGAP 8 08/04/2020      Objective    There were no vitals taken for this visit. BP Readings from Last 3 Encounters:  11/17/20 118/78  09/29/20 125/74  08/11/20 114/73   Wt Readings from Last 3 Encounters:  11/17/20 221 lb 8 oz (100.5 kg)  09/29/20 222 lb 12.8 oz (101.1 kg)  08/11/20 224 lb 6.4 oz (101.8 kg)      Physical Exam Vitals reviewed.  Constitutional:      Appearance: Normal appearance. She is well-developed.  HENT:     Head: Normocephalic and atraumatic.  Pulmonary:     Effort: Pulmonary effort is normal. No respiratory distress (dry, infrequent cough heard; able to speak in full sentences).  Musculoskeletal:     Cervical back: Normal range of motion and neck supple.  Neurological:     Mental Status: She is alert.  Psychiatric:        Behavior: Behavior normal.        Thought Content: Thought content normal.        Judgment: Judgment normal.       Assessment & Plan     1. COVID-19 - Continue OTC symptomatic management of choice - Will send OTC vitamins and supplement information through AVS - Tessalon perles prescribed for cough; patient has zofran already on hand for nausea - Patient enrolled in MyChart symptom monitoring - Push fluids - Rest as needed - Referral to Covid treatment team placed, consult appreciated - Discussed return precautions and when to seek in-person evaluation, sent via [facility]S  as well - benzonatate (TESSALON) 100 MG capsule; Take 1 capsule (100 mg total) by mouth 3 (three) times daily as needed for cough.  Dispense: 30 capsule; Refill: 0 - Ambulatory referral for Covid Treatment   No follow-ups on file.     I discussed the assessment and treatment plan with the patient. The patient was provided an opportunity to ask questions and all were answered. The patient agreed with the plan and demonstrated an understanding of the instructions.   The patient was advised to call back or seek an in-person evaluation if the symptoms worsen or if the condition fails to improve as anticipated.  I provided 10 minutes of face-to-face time during this encounter via MyChart Video enabled encounter.   [place] [phone] (phone) [phone] (fax)  [place]

## 2020-12-08 ENCOUNTER — Telehealth: Payer: Self-pay | Admitting: Oncology

## 2020-12-08 ENCOUNTER — Encounter: Payer: Self-pay | Admitting: Oncology

## 2020-12-08 MED ORDER — MOLNUPIRAVIR EUA 200MG CAPSULE: 4.0000 | ORAL_CAPSULE | Freq: Two times a day (BID) | ORAL | 0 refills | Status: AC

## 2020-12-08 NOTE — Telephone Encounter (Signed)
Outpatient Oral COVID Treatment Note  I connected with Wendy Marshall on 12/08/2020/3:54 PM by telephone and verified that I am speaking with the correct person using two identifiers.  I discussed the limitations, risks, security, and privacy concerns of performing an evaluation and management service by telephone and the availability of in person appointments. I also discussed with the patient that there may be a patient responsible charge related to this service. The patient expressed understanding and agreed to proceed.  Patient location: Home Provider location: Clinic   Diagnosis: COVID-19 infection  Purpose of visit: Discussion of potential use of Molnupiravir or Paxlovid, a new treatment for mild to moderate COVID-19 viral infection in non-hospitalized patients.   Subjective: Patient is a 65 y.o. female who has been diagnosed with COVID 19 viral infection.  Their symptoms began on 12/04/20. / Past Medical History:  Diagnosis Date  . Arthritis    "all over"  . Chondromalacia of knee, right 08/2017  . Complication of anesthesia    hard to wake up post-op  . Dental crowns present    x 2  . Difficulty swallowing pills    since cervical fusion  . Family history of adverse reaction to anesthesia    pt's mother has hx. of being hard to wake up post-op  . GERD (gastroesophageal reflux disease)    no current med.  Marland Kitchen Headache    x 3 days (08/18/2017)  . High cholesterol   . History of pulmonary embolus (PE) 03/09/2016   bilateral  . Hypothyroidism   . Limited joint range of motion (ROM)    cervical spine - s/p fusion  . Medial meniscus tear 08/2017   right knee  . Sleep apnea    uses CPAP nightly    Allergies  Allergen Reactions  . Penicillins Hives and Shortness Of Breath  . Adhesive [Tape] Other (See Comments)    SKIN TEARS  . Contrast Media [Iodinated Diagnostic Agents] Hives  . Flexeril [Cyclobenzaprine] Other (See Comments)    AFFECTED HEART RATE  . Omnipaque  [Iohexol] Hives  . Soma [Carisoprodol] Other (See Comments)    AFFECTED HEART RATE  . Metaxalone   . Methocarbamol   . Other Other (See Comments)    Muscle relaxer- heart problems   . Tizanidine      Current Outpatient Medications:  .  molnupiravir EUA 200 mg CAPS, Take 4 capsules (800 mg total) by mouth 2 (two) times daily for 5 days., Disp: 40 capsule, Rfl: 0 .  benzonatate (TESSALON) 100 MG capsule, Take 1 capsule (100 mg total) by mouth 3 (three) times daily as needed for cough., Disp: 30 capsule, Rfl: 0 .  ELIQUIS 5 MG TABS tablet, Take 1 tablet by mouth twice daily, Disp: 60 tablet, Rfl: 6 .  levothyroxine (SYNTHROID) 112 MCG tablet, Take one half tablet by mouth on mondays and fridays and one whole tablet all other days, Disp: 72 tablet, Rfl: 0 .  MAGNESIUM PO, Take 50 mg by mouth daily. , Disp: , Rfl:  .  Multiple Vitamin (MULTIVITAMIN) tablet, Take 1 tablet by mouth daily., Disp: , Rfl:  .  OVER THE COUNTER MEDICATION, Vitamin d 1000 units daily, Disp: , Rfl:  .  rosuvastatin (CRESTOR) 5 MG tablet, Take 1 tablet (5 mg total) by mouth daily., Disp: 90 tablet, Rfl: 1  Objective: Patient sounds stable.  They are in no apparent distress.  Breathing is non labored.  Mood and behavior are normal.  Laboratory Data:  No results  found for this or any previous visit (from the past 2160 hour(s)).   Assessment: 65 y.o. female with mild/moderate COVID 19 viral infection diagnosed on 12/06/20 at high risk for progression to severe COVID 19.  Plan:  This patient is a 65 y.o. female that meets the following criteria for Emergency Use Authorization of: Molnupiravir  1. Age >18 yr 2. SARS-COV-2 positive test 3. Symptom onset < 5 days 4. Mild-to-moderate COVID disease with high risk for severe progression to hospitalization or death   I have spoken and communicated the following to the patient or parent/caregiver regarding: 1. Molnupiravir is an unapproved drug that is authorized for use  under an [employer] production planner.  2. There are no adequate, approved, available products for the treatment of COVID-19 in adults who have mild-to-moderate COVID-19 and are at high risk for progressing to severe COVID-19, including hospitalization or death. 3. Other therapeutics are currently authorized. For additional information on all products authorized for treatment or prevention of COVID-19, please see [email or web address].  4. There are benefits and risks of taking this treatment as outlined in the "Fact Sheet for Patients and Caregivers."  5. "Fact Sheet for Patients and Caregivers" was reviewed with patient. A hard copy will be provided to patient from pharmacy prior to the patient receiving treatment. 6. Patients should continue to self-isolate and use infection control measures (e.g., wear mask, isolate, social distance, avoid sharing personal items, clean and disinfect "high touch" surfaces, and frequent handwashing) according to CDC guidelines.  7. The patient or parent/caregiver has the option to accept or refuse treatment. 8. [place] has established a pregnancy surveillance program. 9. Females of childbearing potential should use a reliable method of contraception correctly and consistently, as applicable, for the duration of treatment and for 4 days after the last dose of Molnupiravir. 83. Males of reproductive potential who are sexually active with females of childbearing potential should use a reliable method of contraception correctly and consistently during treatment and for at least 3 months after the last dose. 11. Pregnancy status and risk was assessed. Patient verbalized understanding of precautions.   After reviewing above information with the patient, the patient agrees to receive molnupiravir.  Follow up instructions:    . Take prescription BID x 5 days as  directed . Reach out to pharmacist for counseling on medication if desired . For concerns regarding further COVID symptoms please follow up with your PCP or urgent care . For urgent or life-threatening issues, seek care at your local emergency department  The patient was provided an opportunity to ask questions, and all were answered. The patient agreed with the plan and demonstrated an understanding of the instructions.   [facility]- [address] in [place],   The patient was advised to call their PCP or seek an in-person evaluation if the symptoms worsen or if the condition fails to improve as anticipated.   I provided 10 minutes of non face-to-face telephone visit time during this encounter, and > 50% was spent counseling as documented under my assessment & plan.  Jacquelin Hawking, NP 12/08/2020 /3:54 PM

## 2020-12-23 ENCOUNTER — Other Ambulatory Visit: Payer: Self-pay | Admitting: Family Medicine

## 2020-12-29 ENCOUNTER — Telehealth: Payer: Self-pay | Admitting: Pharmacy Technician

## 2020-12-29 DIAGNOSIS — Z596 Low income: Secondary | ICD-10-CM

## 2020-12-29 NOTE — Progress Notes (Signed)
[facility])                                            [place] Team    12/29/2020  Wendy Marshall 1956-05-12 [id number]   Received both patient and provider portion(s) of patient assistance application(s) for Eliquis. Faxed completed application and required documents into [employer].    [name] P. Cassidy Tabet, [place]  [phone]

## 2021-01-07 DIAGNOSIS — M961 Postlaminectomy syndrome, not elsewhere classified: Secondary | ICD-10-CM | POA: Diagnosis not present

## 2021-01-07 DIAGNOSIS — M5412 Radiculopathy, cervical region: Secondary | ICD-10-CM | POA: Diagnosis not present

## 2021-01-08 ENCOUNTER — Telehealth: Payer: Self-pay | Admitting: Pharmacy Technician

## 2021-01-08 DIAGNOSIS — Z596 Low income: Secondary | ICD-10-CM

## 2021-01-08 NOTE — Progress Notes (Signed)
[facility])                                            [place] Team    01/08/2021  Wendy Marshall [birth year] [id number]  Care coordination placed to [employer] in regards to Eliquis application.  Spoke to [place] who informed patient needs to spend $120.13 more OOP between household members in order to get approved for patient assistance.  Successful outreach call placed. Spoke to patient's husband. HIPAA verified. Informed him of the determination and he verbalized understanding. He informed he would outreach once they have met that OOP requirement from the patient assistance program.  Luiz Ochoa. Sylvester Minton, [place]  [phone]

## 2021-01-22 ENCOUNTER — Other Ambulatory Visit (HOSPITAL_COMMUNITY): Payer: Self-pay | Admitting: Hematology

## 2021-03-02 ENCOUNTER — Other Ambulatory Visit (HOSPITAL_COMMUNITY): Payer: Self-pay | Admitting: Physician Assistant

## 2021-03-18 ENCOUNTER — Other Ambulatory Visit (HOSPITAL_COMMUNITY): Payer: Self-pay | Admitting: Physician Assistant

## 2021-03-25 ENCOUNTER — Other Ambulatory Visit: Payer: Self-pay | Admitting: *Deleted

## 2021-03-25 MED ORDER — LEVOTHYROXINE SODIUM 112 MCG PO TABS: ORAL_TABLET | ORAL | 0 refills | Status: DC

## 2021-05-09 ENCOUNTER — Other Ambulatory Visit (HOSPITAL_COMMUNITY): Payer: Self-pay | Admitting: Hematology

## 2021-06-03 DIAGNOSIS — H524 Presbyopia: Secondary | ICD-10-CM | POA: Diagnosis not present

## 2021-07-07 ENCOUNTER — Encounter: Payer: Self-pay | Admitting: Emergency Medicine

## 2021-07-07 ENCOUNTER — Ambulatory Visit
Admission: EM | Admit: 2021-07-07 | Discharge: 2021-07-07 | Disposition: A | Discharge status: Home / Self Care | Payer: Medicare HMO | Attending: Family Medicine | Admitting: Family Medicine

## 2021-07-07 ENCOUNTER — Other Ambulatory Visit: Payer: Self-pay

## 2021-07-07 DIAGNOSIS — R059 Cough, unspecified: Secondary | ICD-10-CM | POA: Diagnosis not present

## 2021-07-07 DIAGNOSIS — J069 Acute upper respiratory infection, unspecified: Secondary | ICD-10-CM | POA: Diagnosis not present

## 2021-07-07 DIAGNOSIS — R062 Wheezing: Secondary | ICD-10-CM

## 2021-07-07 HISTORY — DX: Other pulmonary embolism without acute cor pulmonale: I26.99

## 2021-07-07 MED ORDER — ALBUTEROL SULFATE HFA 108 (90 BASE) MCG/ACT IN AERS
1.0000 | INHALATION_SPRAY | Freq: Four times a day (QID) | RESPIRATORY_TRACT | 0 refills | Status: DC | PRN
Start: 2021-07-07 — End: 2021-09-03

## 2021-07-07 MED ORDER — AZITHROMYCIN 250 MG PO TABS: ORAL_TABLET | ORAL | 0 refills | Status: DC

## 2021-07-07 MED ORDER — OSELTAMIVIR PHOSPHATE 75 MG PO CAPS: 75.0000 mg | ORAL_CAPSULE | Freq: Two times a day (BID) | ORAL | 0 refills | Status: DC

## 2021-07-07 MED ORDER — PROMETHAZINE-DM 6.25-15 MG/5ML PO SYRP: 5.0000 mL | ORAL_SOLUTION | Freq: Four times a day (QID) | ORAL | 0 refills | Status: DC | PRN

## 2021-07-07 NOTE — ED Triage Notes (Signed)
Pt reports cold x1 week. Pt reports last few days fatigue, fever, chills,body aches. Nad noted.

## 2021-07-07 NOTE — ED Provider Notes (Signed)
[facility]    CSN: [id number] Arrival date & time: 07/07/21  1452      History   Chief Complaint Chief Complaint  Patient presents with   Fever    HPI Wendy Marshall is a 65 y.o. female.   Presenting today with over a week of mild cough and now 2 days of severe fatigue, weakness, fever, chills, body aches, worsening cough, runny nose, sore throat.  Denies chest pain, significant shortness of breath, abdominal pain, nausea vomiting or diarrhea.  So far taking Mucinex and Tylenol with minimal relief.  Past medical history significant for history of pulmonary embolism on anticoagulation chronically.  Multiple sick contacts recently.   Past Medical History:  Diagnosis Date   Arthritis    "all over"   Chondromalacia of knee, right [id number]/6195   Complication of anesthesia    hard to wake up post-op   Dental crowns present    x 2   Difficulty swallowing pills    since cervical fusion   Family history of adverse reaction to anesthesia    pt's mother has hx. of being hard to wake up post-op   GERD (gastroesophageal reflux disease)    no current med.   Headache    x 3 days (08/18/2017)   High cholesterol    History of pulmonary embolus (PE) 03/09/2016   bilateral   Hypothyroidism    Limited joint range of motion (ROM)    cervical spine - s/p fusion   Medial meniscus tear 08/2017   right knee   Pulmonary embolism ([place])    Sleep apnea    uses CPAP nightly    Patient Active Problem List   Diagnosis Date Noted   Screening for colorectal cancer 09/10/2019   Encounter for gynecological examination with Papanicolaou smear of cervix 09/10/2019   Pelvic pressure in female 08/27/2019   Left lower quadrant abdominal tenderness without rebound tenderness 08/27/2019   Acute medial meniscus tear of right knee 08/22/2017   Vitamin D deficiency 02/20/2017   Hyperlipidemia 02/20/2017   OSA on CPAP 10/20/2016   Nonspecific chest pain 10/20/2016   Dyspnea 04/21/2016    Hypersomnia with sleep apnea 04/11/2016   Pulmonary embolism ([place]) 03/09/2016   Leukocytosis 03/09/2016   Fever 03/09/2016   Status post total replacement of left hip 02/01/2016   Left-sided low back pain with left-sided sciatica 08/06/2015   Hypothyroidism 08/05/2013   Fibromyalgia 08/05/2013    Past Surgical History:  Procedure Laterality Date   ANTERIOR CERVICAL DECOMP/DISCECTOMY FUSION  07/18/2002   C5-6   CARPAL TUNNEL RELEASE Bilateral    CHONDROPLASTY Right 08/23/2017   Procedure: CHONDROPLASTY;  Surgeon: Frederik Pear, MD;  Location: [facility];  Service: Orthopedics;  Laterality: Right;   EYE MUSCLE SURGERY Bilateral    KNEE ARTHROSCOPY Right 08/23/2017   Procedure: ARTHROSCOPY RIGHT KNEE REMOVAL OF LOOSE BODIES;  Surgeon: Frederik Pear, MD;  Location: [facility];  Service: Orthopedics;  Laterality: Right;   KNEE ARTHROSCOPY WITH MEDIAL MENISECTOMY Right 08/23/2017   Procedure: KNEE ARTHROSCOPY WITH MEDIAL MENISECTOMY;  Surgeon: Frederik Pear, MD;  Location: [facility];  Service: Orthopedics;  Laterality: Right;   KNEE SURGERY Right 03/2019   SHOULDER ARTHROSCOPY Left 06/11/2001   TONSILLECTOMY     age 30   TOTAL HIP ARTHROPLASTY Left 02/01/2016   Procedure: LEFT TOTAL HIP ARTHROPLASTY ANTERIOR APPROACH;  Surgeon: Paralee Cancel, MD;  Location: [facility];  Service: Orthopedics;  Laterality: Left;  Failed Spinal to  LMA   TOTAL HIP ARTHROPLASTY Right 05/2019   TUBAL LIGATION     WISDOM TOOTH EXTRACTION      OB History     Gravida  2   Para  2   Term  2   Preterm      AB      Living  2      SAB      IAB      Ectopic      Multiple      Live Births               Home Medications    Prior to Admission medications   Medication Sig Start Date End Date Taking? Authorizing Provider  albuterol (VENTOLIN HFA) 108 (90 Base) MCG/ACT inhaler Inhale 1-2 puffs into the lungs every 6 (six) hours as needed for wheezing or  shortness of breath. 07/07/21  Yes Volney American, PA-C  azithromycin (ZITHROMAX) 250 MG tablet Take first 2 tablets together, then 1 every day until finished. Do not start unless worsening over next 5-7 days 07/07/21  Yes Volney American, PA-C  oseltamivir (TAMIFLU) 75 MG capsule Take 1 capsule (75 mg total) by mouth every 12 (twelve) hours. 07/07/21  Yes Volney American, PA-C  promethazine-dextromethorphan (PROMETHAZINE-DM) 6.25-15 MG/5ML syrup Take 5 mLs by mouth 4 (four) times daily as needed. 07/07/21  Yes Volney American, PA-C  benzonatate (TESSALON) 100 MG capsule Take 1 capsule (100 mg total) by mouth 3 (three) times daily as needed for cough. 12/07/20   Mar Daring, PA-C  ELIQUIS 5 MG TABS tablet Take 1 tablet by mouth twice daily 05/10/21   [name], MD  levothyroxine (SYNTHROID) 112 MCG tablet Take one half tablet by mouth on mondays and fridays and one whole tablet all other days 03/25/21   [name], MD  MAGNESIUM PO Take 50 mg by mouth daily.     [provider]  Multiple Vitamin (MULTIVITAMIN) tablet Take 1 tablet by mouth daily.    [provider]  OVER THE COUNTER MEDICATION Vitamin d 1000 units daily    [provider]  rosuvastatin (CRESTOR) 5 MG tablet Take 1 tablet by mouth once daily 12/24/20   [name], MD    Family History Family History  Problem Relation Age of Onset   Hypertension Mother    Anesthesia problems Mother        hard to wake up post-op   Brain cancer Father    Colon cancer Other    Heart attack Other    Throat cancer Other    Colon cancer Paternal Grandmother    Heart attack Maternal Grandfather    Throat cancer Maternal Grandfather     Social History Social History   Tobacco Use   Smoking status: Former    Packs/day: 0.00    Years: 11.00    Pack years: 0.00    Types: Cigarettes    Quit date: 07/31/1996    Years since quitting: 24.9   Smokeless tobacco: Never   Vaping Use   Vaping Use: Never used  Substance Use Topics   Alcohol use: Yes    Comment: rarely   Drug use: No     Allergies   Penicillins, Adhesive [tape], Contrast media [iodinated diagnostic agents], Flexeril [cyclobenzaprine], Omnipaque [iohexol], Soma [carisoprodol], Metaxalone, Methocarbamol, Other, and Tizanidine   Review of Systems Review of Systems Per HPI  Physical Exam Triage Vital Signs ED Triage Vitals  Enc  Vitals Group     BP 07/07/21 1552 125/82     Pulse Rate 07/07/21 1552 (!) 102     Resp 07/07/21 1552 18     Temp 07/07/21 1552 99.2 F (37.3 C)     Temp Source 07/07/21 1552 Oral     SpO2 07/07/21 1552 93 %     Weight 07/07/21 1553 210 lb (95.3 kg)     Height 07/07/21 1553 5\' 4"  (1.626 m)     Head Circumference --      Peak Flow --      Pain Score 07/07/21 1552 6     Pain Loc --      Pain Edu? --      Excl. in [place]? --    No data found.  Updated Vital Signs BP 125/82 (BP Location: Right Arm)   Pulse (!) 102   Temp 99.2 F (37.3 C) (Oral)   Resp 18   Ht 5\' 4"  (1.626 m)   Wt 210 lb (95.3 kg)   SpO2 93%   BMI 36.05 kg/m   Visual Acuity Right Eye Distance:   Left Eye Distance:   Bilateral Distance:    Right Eye Near:   Left Eye Near:    Bilateral Near:     Physical Exam Vitals and nursing note reviewed.  Constitutional:      Appearance: Normal appearance.  HENT:     Head: Atraumatic.     Right Ear: Tympanic membrane and external ear normal.     Left Ear: Tympanic membrane and external ear normal.     Nose: Rhinorrhea present.     Mouth/Throat:     Mouth: Mucous membranes are moist.     Pharynx: Posterior oropharyngeal erythema present.  Eyes:     Extraocular Movements: Extraocular movements intact.     Conjunctiva/sclera: Conjunctivae normal.  Cardiovascular:     Rate and Rhythm: Normal rate and regular rhythm.     Heart sounds: Normal heart sounds.  Pulmonary:     Effort: Pulmonary effort is normal.     Breath sounds: Normal  breath sounds. No wheezing or rales.  Musculoskeletal:        General: Normal range of motion.     Cervical back: Normal range of motion and neck supple.  Skin:    General: Skin is warm and dry.  Neurological:     Mental Status: She is alert and oriented to person, place, and time.  Psychiatric:        Mood and Affect: Mood normal.        Thought Content: Thought content normal.     UC Treatments / Results  Labs (all labs ordered are listed, but only abnormal results are displayed) Labs Reviewed  COVID-19, FLU A+B NAA    EKG   Radiology No results found.  Procedures Procedures (including critical care time)  Medications Ordered in UC Medications - No data to display  Initial Impression / Assessment and Plan / UC Course  I have reviewed the triage vital signs and the nursing notes.  Pertinent labs & imaging results that were available during my care of the patient were reviewed by me and considered in my medical decision making (see chart for details).     Mildly tachycardic in triage, otherwise vital signs reassuring.  Suspect new onset viral illness such as influenza, COVID and flu test pending and will proactively treat with Tamiflu given significantly worsening symptoms the past 2 days.  We will also send  albuterol inhaler, Phenergan DM for symptomatic benefit.  Azithromycin sent in case significantly worsening over the course of the weekend.  Return for acutely worsening symptoms at any time. Final Clinical Impressions(s) / UC Diagnoses   Final diagnoses:  Cough, unspecified type  Viral URI with cough  Wheezing   Discharge Instructions   None    ED Prescriptions     Medication Sig Dispense Auth. Provider   oseltamivir (TAMIFLU) 75 MG capsule Take 1 capsule (75 mg total) by mouth every 12 (twelve) hours. 10 capsule Volney American, PA-C   azithromycin (ZITHROMAX) 250 MG tablet Take first 2 tablets together, then 1 every day until finished. Do not  start unless worsening over next 5-7 days 6 tablet Volney American, PA-C   promethazine-dextromethorphan (PROMETHAZINE-DM) 6.25-15 MG/5ML syrup Take 5 mLs by mouth 4 (four) times daily as needed. 100 mL Volney American, PA-C   albuterol (VENTOLIN HFA) 108 (90 Base) MCG/ACT inhaler Inhale 1-2 puffs into the lungs every 6 (six) hours as needed for wheezing or shortness of breath. 18 g Volney American, [place]      PDMP not reviewed this encounter.   Volney American, [place] 07/07/21 1738

## 2021-07-08 ENCOUNTER — Other Ambulatory Visit: Payer: Self-pay | Admitting: Family Medicine

## 2021-07-08 LAB — COVID-19, FLU A+B NAA
Influenza A, NAA: NOT DETECTED
Influenza B, NAA: NOT DETECTED
SARS-CoV-2, NAA: NOT DETECTED

## 2021-07-09 ENCOUNTER — Other Ambulatory Visit: Payer: Self-pay

## 2021-07-09 ENCOUNTER — Ambulatory Visit (INDEPENDENT_AMBULATORY_CARE_PROVIDER_SITE_OTHER): Payer: Medicare HMO | Admitting: Nurse Practitioner

## 2021-07-09 VITALS — HR 117 | Temp 97.8°F | Wt 217.2 lb

## 2021-07-09 DIAGNOSIS — J189 Pneumonia, unspecified organism: Secondary | ICD-10-CM

## 2021-07-09 DIAGNOSIS — R062 Wheezing: Secondary | ICD-10-CM

## 2021-07-09 MED ORDER — PREDNISONE 20 MG PO TABS: ORAL_TABLET | ORAL | 0 refills | Status: DC

## 2021-07-09 NOTE — Progress Notes (Signed)
   Subjective:    Patient ID: Wendy Marshall, female    DOB: [birth year], 65 y.o.   MRN: [id number]  Presents for follow-up after being seen at urgent care 2 days ago.  Was diagnosed with a viral URI with cough.  Continue to have symptoms and then developed a temp of 104 last night.  Had been low-grade up until this.  Having anterior mid chest pain at times.  Describes her breathing as "crackling".  Some wheezing and shortness of breath at times.  Now producing thick yellow sputum.  Cough much better when she uses her Promethazine DM cough syrup from urgent care.  Was given a prescription of Zithromax in case she got worse, has started this within the past 24 hours.  No ear pain.  Some sore throat.  Taking fluids well.  Voiding normal limit.  Had watery diarrhea x1, no vomiting.  Last use her albuterol inhaler early this morning, many hours ago.  Has been using her albuterol inhaler about every 6 hours which helps with her breathing.  Non-smoker.  Denies any vaping.  Of note she states her husband is also sick and was diagnosed with pneumonia at the same visit or urgent care.   Review of Systems  Constitutional:  Positive for fever.  Respiratory:  Positive for cough, shortness of breath and wheezing.   Cardiovascular:  Positive for chest pain.      Objective:   Physical Exam NAD.  Alert, oriented.  Normal color.  TMs mild clear effusion, no erythema.  Pharynx clear moist.  Neck supple with mild soft anterior adenopathy.  Lungs distinct coarse expiratory crackles noted along the left lower lobe, otherwise lungs are clear.  No tachypnea.  Heart regular rate rhythm.  Abdomen soft nondistended nontender. Today's Vitals   07/09/21 1516  Pulse: (!) 117  Temp: 97.8 F (36.6 C)  TempSrc: Oral  SpO2: 97%  Weight: 217 lb 3.2 oz (98.5 kg)   Body mass index is 37.28 kg/m. Results for orders placed or performed during the hospital encounter of 07/07/21  Covid-19, Flu A+B ([facility])   Specimen:  Nasopharyngeal Swab   Naso  Result Value Ref Range   SARS-CoV-2, NAA Not Detected Not Detected   Influenza A, NAA Not Detected Not Detected   Influenza B, NAA Not Detected Not Detected   Test Information: Comment           Assessment & Plan:  Pneumonia of left lower lobe due to infectious organism  Wheezing Meds ordered this encounter  Medications   predniSONE (DELTASONE) 20 MG tablet    Sig: 3 po qd x 3 d then 2 po qd x 3 d then 1 po qd x 2 d    Dispense:  17 tablet    Refill:  0    Order Specific Question:   Supervising Provider    Answer:   Sallee Lange A [[id number]]   Continue Zithromax as directed. Added course of prednisone.  Tylenol as directed for pain and fever. Continue albuterol inhaler as directed over the weekend.  Continue promethazine DM cough syrup. Discussed importance of adequate hydration.  Reviewed warning signs including worsening chest pain, increased shortness of breath, vomiting or worsening wheezing.  Patient to seek help immediately over the weekend if worse.  Otherwise recommend follow-up in 72 hours for recheck.

## 2021-07-10 ENCOUNTER — Encounter: Payer: Self-pay | Admitting: Nurse Practitioner

## 2021-07-12 ENCOUNTER — Ambulatory Visit (HOSPITAL_COMMUNITY)
Admission: RE | Admit: 2021-07-12 | Discharge: 2021-07-12 | Disposition: A | Discharge status: Home / Self Care | Payer: Medicare HMO | Source: Ambulatory Visit | Attending: Family Medicine | Admitting: Family Medicine

## 2021-07-12 ENCOUNTER — Other Ambulatory Visit: Payer: Self-pay

## 2021-07-12 ENCOUNTER — Ambulatory Visit (INDEPENDENT_AMBULATORY_CARE_PROVIDER_SITE_OTHER): Payer: Medicare HMO | Admitting: Family Medicine

## 2021-07-12 VITALS — BP 138/83 | HR 76 | Ht 60.0 in | Wt 217.2 lb

## 2021-07-12 DIAGNOSIS — J189 Pneumonia, unspecified organism: Secondary | ICD-10-CM

## 2021-07-12 DIAGNOSIS — R059 Cough, unspecified: Secondary | ICD-10-CM | POA: Diagnosis not present

## 2021-07-12 DIAGNOSIS — R0602 Shortness of breath: Secondary | ICD-10-CM | POA: Diagnosis not present

## 2021-07-12 MED ORDER — DOXYCYCLINE HYCLATE 100 MG PO TABS: 100.0000 mg | ORAL_TABLET | Freq: Two times a day (BID) | ORAL | 0 refills | Status: DC

## 2021-07-12 NOTE — Progress Notes (Signed)
   Subjective:    Patient ID: Wendy Marshall, female    DOB: 1956-07-03, 64 y.o.   MRN: [id number]  HPI: Patient was seen on 07/09/21 and was dx with left lower lobe pneumonia. Patient feels like she is a little better. Patient says she is waking up drenched in sweat. Febrile sweats cough fatigue no doe no sob No v or d  Review of Systems     Objective:   Physical Exam Gen-NAD not toxic TMS-normal bilateral T- normal no redness Chest-CTA respiratory rate normal no crackles CV RRR no murmur Skin-warm dry Neuro-grossly normal Not toxic No dyspnea Pneumonia by history      Assessment & Plan:  Cxr  Labs Doxy bid 10 dasy Recheck if worse Rest Triple swab

## 2021-07-14 LAB — CBC WITH DIFFERENTIAL/PLATELET
Basophils Absolute: 0 10*3/uL (ref 0.0–0.2)
Basos: 0 %
EOS (ABSOLUTE): 0 10*3/uL (ref 0.0–0.4)
Eos: 0 %
Hematocrit: 42.4 % (ref 34.0–46.6)
Hemoglobin: 13.9 g/dL (ref 11.1–15.9)
Immature Grans (Abs): 0 10*3/uL (ref 0.0–0.1)
Immature Granulocytes: 0 %
Lymphocytes Absolute: 1.6 10*3/uL (ref 0.7–3.1)
Lymphs: 17 %
MCH: 26.2 pg — ABNORMAL LOW (ref 26.6–33.0)
MCHC: 32.8 g/dL (ref 31.5–35.7)
MCV: 80 fL (ref 79–97)
Monocytes Absolute: 0.2 10*3/uL (ref 0.1–0.9)
Monocytes: 3 %
Neutrophils Absolute: 7.6 10*3/uL — ABNORMAL HIGH (ref 1.4–7.0)
Neutrophils: 80 %
Platelets: 365 10*3/uL (ref 150–450)
RBC: 5.3 x10E6/uL — ABNORMAL HIGH (ref 3.77–5.28)
RDW: 13.3 % (ref 11.7–15.4)
WBC: 9.5 10*3/uL (ref 3.4–10.8)

## 2021-07-14 LAB — COVID-19, FLU A+B AND RSV
Influenza A, NAA: NOT DETECTED
Influenza B, NAA: NOT DETECTED
RSV, NAA: NOT DETECTED
SARS-CoV-2, NAA: NOT DETECTED

## 2021-08-09 ENCOUNTER — Ambulatory Visit: Payer: Medicare HMO | Admitting: Family Medicine

## 2021-08-17 ENCOUNTER — Inpatient Hospital Stay (HOSPITAL_COMMUNITY): Payer: Medicare HMO | Attending: Hematology

## 2021-08-17 ENCOUNTER — Other Ambulatory Visit: Payer: Self-pay

## 2021-08-17 DIAGNOSIS — Z8249 Family history of ischemic heart disease and other diseases of the circulatory system: Secondary | ICD-10-CM | POA: Insufficient documentation

## 2021-08-17 DIAGNOSIS — E669 Obesity, unspecified: Secondary | ICD-10-CM | POA: Diagnosis not present

## 2021-08-17 DIAGNOSIS — Z7952 Long term (current) use of systemic steroids: Secondary | ICD-10-CM | POA: Diagnosis not present

## 2021-08-17 DIAGNOSIS — Z86711 Personal history of pulmonary embolism: Secondary | ICD-10-CM | POA: Diagnosis not present

## 2021-08-17 DIAGNOSIS — Z800 Family history of malignant neoplasm of digestive organs: Secondary | ICD-10-CM | POA: Insufficient documentation

## 2021-08-17 DIAGNOSIS — Z79899 Other long term (current) drug therapy: Secondary | ICD-10-CM | POA: Insufficient documentation

## 2021-08-17 DIAGNOSIS — M7989 Other specified soft tissue disorders: Secondary | ICD-10-CM | POA: Insufficient documentation

## 2021-08-17 DIAGNOSIS — Z86718 Personal history of other venous thrombosis and embolism: Secondary | ICD-10-CM | POA: Diagnosis not present

## 2021-08-17 DIAGNOSIS — R5383 Other fatigue: Secondary | ICD-10-CM | POA: Diagnosis not present

## 2021-08-17 DIAGNOSIS — Z87891 Personal history of nicotine dependence: Secondary | ICD-10-CM | POA: Diagnosis not present

## 2021-08-17 DIAGNOSIS — I82452 Acute embolism and thrombosis of left peroneal vein: Secondary | ICD-10-CM

## 2021-08-17 DIAGNOSIS — Z880 Allergy status to penicillin: Secondary | ICD-10-CM | POA: Diagnosis not present

## 2021-08-17 DIAGNOSIS — Z808 Family history of malignant neoplasm of other organs or systems: Secondary | ICD-10-CM | POA: Insufficient documentation

## 2021-08-17 DIAGNOSIS — M79604 Pain in right leg: Secondary | ICD-10-CM | POA: Diagnosis not present

## 2021-08-17 DIAGNOSIS — Z888 Allergy status to other drugs, medicaments and biological substances status: Secondary | ICD-10-CM | POA: Insufficient documentation

## 2021-08-17 DIAGNOSIS — Z8049 Family history of malignant neoplasm of other genital organs: Secondary | ICD-10-CM | POA: Insufficient documentation

## 2021-08-17 DIAGNOSIS — M79605 Pain in left leg: Secondary | ICD-10-CM | POA: Diagnosis not present

## 2021-08-17 DIAGNOSIS — Z981 Arthrodesis status: Secondary | ICD-10-CM | POA: Diagnosis not present

## 2021-08-17 DIAGNOSIS — Z7901 Long term (current) use of anticoagulants: Secondary | ICD-10-CM | POA: Diagnosis not present

## 2021-08-17 LAB — CBC WITH DIFFERENTIAL/PLATELET
Abs Immature Granulocytes: 0.01 10*3/uL (ref 0.00–0.07)
Basophils Absolute: 0.1 10*3/uL (ref 0.0–0.1)
Basophils Relative: 1 %
Eosinophils Absolute: 0.1 10*3/uL (ref 0.0–0.5)
Eosinophils Relative: 2 %
HCT: 45.3 % (ref 36.0–46.0)
Hemoglobin: 14.4 g/dL (ref 12.0–15.0)
Immature Granulocytes: 0 %
Lymphocytes Relative: 35 %
Lymphs Abs: 2.3 10*3/uL (ref 0.7–4.0)
MCH: 26.8 pg (ref 26.0–34.0)
MCHC: 31.8 g/dL (ref 30.0–36.0)
MCV: 84.4 fL (ref 80.0–100.0)
Monocytes Absolute: 0.5 10*3/uL (ref 0.1–1.0)
Monocytes Relative: 7 %
Neutro Abs: 3.6 10*3/uL (ref 1.7–7.7)
Neutrophils Relative %: 55 %
Platelets: 284 10*3/uL (ref 150–400)
RBC: 5.37 MIL/uL — ABNORMAL HIGH (ref 3.87–5.11)
RDW: 14.3 % (ref 11.5–15.5)
WBC: 6.5 10*3/uL (ref 4.0–10.5)
nRBC: 0 % (ref 0.0–0.2)

## 2021-08-17 LAB — COMPREHENSIVE METABOLIC PANEL
ALT: 11 U/L (ref 0–44)
AST: 16 U/L (ref 15–41)
Albumin: 4.2 g/dL (ref 3.5–5.0)
Alkaline Phosphatase: 79 U/L (ref 38–126)
Anion gap: 8 (ref 5–15)
BUN: 12 mg/dL (ref 8–23)
CO2: 27 mmol/L (ref 22–32)
Calcium: 9.3 mg/dL (ref 8.9–10.3)
Chloride: 106 mmol/L (ref 98–111)
Creatinine, Ser: 0.78 mg/dL (ref 0.44–1.00)
GFR, Estimated: >60 mL/min (ref >60)
Glucose, Bld: 73 mg/dL (ref 70–99)
Potassium: 4.2 mmol/L (ref 3.5–5.1)
Sodium: 141 mmol/L (ref 135–145)
Total Bilirubin: 0.7 mg/dL (ref 0.3–1.2)
Total Protein: 7.7 g/dL (ref 6.5–8.1)

## 2021-08-17 LAB — D-DIMER, QUANTITATIVE: D-Dimer, Quant: 0.52 ug/mL-FEU — ABNORMAL HIGH (ref 0.00–0.50)

## 2021-08-24 ENCOUNTER — Other Ambulatory Visit: Payer: Self-pay

## 2021-08-24 ENCOUNTER — Encounter (HOSPITAL_COMMUNITY): Payer: Self-pay | Admitting: Hematology

## 2021-08-24 ENCOUNTER — Inpatient Hospital Stay (HOSPITAL_COMMUNITY): Payer: Medicare HMO | Admitting: Hematology

## 2021-08-24 VITALS — BP 127/50 | HR 108 | Temp 98.0°F | Resp 16 | Ht 62.99 in | Wt 219.6 lb

## 2021-08-24 DIAGNOSIS — Z86718 Personal history of other venous thrombosis and embolism: Secondary | ICD-10-CM | POA: Diagnosis not present

## 2021-08-24 DIAGNOSIS — R5383 Other fatigue: Secondary | ICD-10-CM | POA: Diagnosis not present

## 2021-08-24 DIAGNOSIS — I82452 Acute embolism and thrombosis of left peroneal vein: Secondary | ICD-10-CM

## 2021-08-24 DIAGNOSIS — Z79899 Other long term (current) drug therapy: Secondary | ICD-10-CM | POA: Diagnosis not present

## 2021-08-24 DIAGNOSIS — M79605 Pain in left leg: Secondary | ICD-10-CM | POA: Diagnosis not present

## 2021-08-24 DIAGNOSIS — M7989 Other specified soft tissue disorders: Secondary | ICD-10-CM | POA: Diagnosis not present

## 2021-08-24 DIAGNOSIS — Z7901 Long term (current) use of anticoagulants: Secondary | ICD-10-CM | POA: Diagnosis not present

## 2021-08-24 DIAGNOSIS — M79604 Pain in right leg: Secondary | ICD-10-CM | POA: Diagnosis not present

## 2021-08-24 DIAGNOSIS — E669 Obesity, unspecified: Secondary | ICD-10-CM | POA: Diagnosis not present

## 2021-08-24 DIAGNOSIS — Z86711 Personal history of pulmonary embolism: Secondary | ICD-10-CM | POA: Diagnosis not present

## 2021-08-24 DIAGNOSIS — Z7952 Long term (current) use of systemic steroids: Secondary | ICD-10-CM | POA: Diagnosis not present

## 2021-08-24 MED ORDER — MISC. DEVICES MISC: 0 refills | Status: DC

## 2021-08-24 NOTE — Progress Notes (Signed)
[place], [place] [address]   CLINIC:  Medical Oncology/Hematology  PCP:  [name], [place] / [place] [address]  [phone]  REASON FOR VISIT:  Follow-up for recurrent thromboembolism  PRIOR THERAPY: none  CURRENT THERAPY: Eliquis 5 mg BID  INTERVAL HISTORY:  [name], a 66 y.o. female, returns for routine follow-up for her recurrent thromboembolism. Wendy Marshall was last seen on 08/11/2020.  Today she reports feeling well. She denies SOB. She reports leg pain bilaterally which is worse on the left. She denies CP.   REVIEW OF SYSTEMS:  Review of Systems  Constitutional:  Positive for fatigue. Negative for appetite change.  Respiratory:  Negative for shortness of breath.   All other systems reviewed and are negative.  PAST MEDICAL/SURGICAL HISTORY:  Past Medical History:  Diagnosis Date   Arthritis    "all over"   Chondromalacia of knee, right 21/1941   Complication of anesthesia    hard to wake up post-op   Dental crowns present    x 2   Difficulty swallowing pills    since cervical fusion   Family history of adverse reaction to anesthesia    pt's mother has hx. of being hard to wake up post-op   GERD (gastroesophageal reflux disease)    no current med.   Headache    x 3 days (08/18/2017)   High cholesterol    History of pulmonary embolus (PE) 03/09/2016   bilateral   Hypothyroidism    Limited joint range of motion (ROM)    cervical spine - s/p fusion   Medial meniscus tear 08/2017   right knee   Pulmonary embolism ([facility])    Sleep apnea    uses CPAP nightly   Past Surgical History:  Procedure Laterality Date   ANTERIOR CERVICAL DECOMP/DISCECTOMY FUSION  07/18/2002   C5-6   CARPAL TUNNEL RELEASE Bilateral    CHONDROPLASTY Right 08/23/2017   Procedure: CHONDROPLASTY;  Surgeon: Frederik Pear, MD;  Location: [facility];  Service: Orthopedics;  Laterality: Right;   EYE  MUSCLE SURGERY Bilateral    KNEE ARTHROSCOPY Right 08/23/2017   Procedure: ARTHROSCOPY RIGHT KNEE REMOVAL OF LOOSE BODIES;  Surgeon: Frederik Pear, MD;  Location: [facility];  Service: Orthopedics;  Laterality: Right;   KNEE ARTHROSCOPY WITH MEDIAL MENISECTOMY Right 08/23/2017   Procedure: KNEE ARTHROSCOPY WITH MEDIAL MENISECTOMY;  Surgeon: Frederik Pear, MD;  Location: [facility];  Service: Orthopedics;  Laterality: Right;   KNEE SURGERY Right 03/2019   SHOULDER ARTHROSCOPY Left 06/11/2001   TONSILLECTOMY     age 65   TOTAL HIP ARTHROPLASTY Left 02/01/2016   Procedure: LEFT TOTAL HIP ARTHROPLASTY ANTERIOR APPROACH;  Surgeon: Paralee Cancel, MD;  Location: [facility];  Service: Orthopedics;  Laterality: Left;  Failed Spinal to LMA   TOTAL HIP ARTHROPLASTY Right 05/2019   TUBAL LIGATION     WISDOM TOOTH EXTRACTION      SOCIAL HISTORY:  Social History   Socioeconomic History   Marital status: Married    Spouse name: [name]   Number of children: 2   Years of education: Not on file   Highest education level: High school graduate  Occupational History   Occupation: Surveyor, quantity    Comment: retired  Tobacco Use   Smoking status: Former    Packs/day: 0.00    Years: 11.00    Pack years: 0.00    Types: Cigarettes  Quit date: 07/31/1996    Years since quitting: 25.0   Smokeless tobacco: Never  Vaping Use   Vaping Use: Never used  Substance and Sexual Activity   Alcohol use: Yes    Comment: rarely   Drug use: No   Sexual activity: Yes    Birth control/protection: None, Post-menopausal, Surgical    Comment: tubal  Other Topics Concern   Not on file  Social History Narrative   Lives with spouse   Social Determinants of Health   Financial Resource Strain: Low Risk    Difficulty of Paying Living Expenses: Not hard at all  Food Insecurity: No Food Insecurity   Worried About Charity fundraiser in the Last Year: Never true   Ran Out of Food in the Last Year:  Never true  Transportation Needs: No Transportation Needs   Lack of Transportation (Medical): No   Lack of Transportation (Non-Medical): No  Physical Activity: Inactive   Days of Exercise per Week: 0 days   Minutes of Exercise per Session: 0 min  Stress: No Stress Concern Present   Feeling of Stress : Only a little  Social Connections: Moderately Integrated   Frequency of Communication with Friends and Family: More than three times a week   Frequency of Social Gatherings with Friends and Family: Twice a week   Attends Religious Services: More than 4 times per year   Active Member of [employer] or Organizations: No   Attends Music therapist: Never   Marital Status: Married  Human resources officer Violence: Not At Risk   Fear of Current or Ex-Partner: No   Emotionally Abused: No   Physically Abused: No   Sexually Abused: No    FAMILY HISTORY:  Family History  Problem Relation Age of Onset   Hypertension Mother    Anesthesia problems Mother        hard to wake up post-op   Brain cancer Father    Colon cancer Other    Heart attack Other    Throat cancer Other    Colon cancer Paternal Grandmother    Heart attack Maternal Grandfather    Throat cancer Maternal Grandfather     CURRENT MEDICATIONS:  Current Outpatient Medications  Medication Sig Dispense Refill   albuterol (VENTOLIN HFA) 108 (90 Base) MCG/ACT inhaler Inhale 1-2 puffs into the lungs every 6 (six) hours as needed for wheezing or shortness of breath. 18 g 0   doxycycline (VIBRA-TABS) 100 MG tablet Take 1 tablet (100 mg total) by mouth 2 (two) times daily. 20 tablet 0   ELIQUIS 5 MG TABS tablet Take 1 tablet by mouth twice daily 60 tablet 6   levothyroxine (SYNTHROID) 112 MCG tablet TAKE ONE-HALF TABLET BY MOUTH ON MONDAYS AND FRIDAYS AND  ONE  WHOLE  TABLET  ON  ALL  OTHER  DAYS 72 tablet 0   MAGNESIUM PO Take 50 mg by mouth daily.     Multiple Vitamin (MULTIVITAMIN) tablet Take 1 tablet by mouth daily.     OVER  THE COUNTER MEDICATION Vitamin d 1000 units daily     predniSONE (DELTASONE) 20 MG tablet 3 po qd x 3 d then 2 po qd x 3 d then 1 po qd x 2 d 17 tablet 0   promethazine-dextromethorphan (PROMETHAZINE-DM) 6.25-15 MG/5ML syrup Take 5 mLs by mouth 4 (four) times daily as needed. 100 mL 0   rosuvastatin (CRESTOR) 5 MG tablet Take 1 tablet by mouth once daily 90 tablet 0  No current facility-administered medications for this visit.    ALLERGIES:  Allergies  Allergen Reactions   Penicillins Hives and Shortness Of Breath   Adhesive [Tape] Other (See Comments)    SKIN TEARS   Contrast Media [Iodinated Contrast Media] Hives   Flexeril [Cyclobenzaprine] Other (See Comments)    AFFECTED HEART RATE   Omnipaque [Iohexol] Hives   Soma [Carisoprodol] Other (See Comments)    AFFECTED HEART RATE   Metaxalone    Methocarbamol    Other Other (See Comments)    Muscle relaxer- heart problems    Tizanidine     PHYSICAL EXAM:  Performance status (ECOG): 1 - Symptomatic but completely ambulatory  Vitals:   08/24/21 1501  BP: (!) 127/50  Pulse: (!) 108  Resp: 16  Temp: 98 F (36.7 C)  SpO2: 99%   Wt Readings from Last 3 Encounters:  08/24/21 219 lb 9.3 oz (99.6 kg)  07/12/21 217 lb 3.2 oz (98.5 kg)  07/09/21 217 lb 3.2 oz (98.5 kg)   Physical Exam Vitals reviewed.  Constitutional:      Appearance: Normal appearance. She is obese.  Cardiovascular:     Rate and Rhythm: Normal rate and regular rhythm.     Pulses: Normal pulses.     Heart sounds: Normal heart sounds.  Pulmonary:     Effort: Pulmonary effort is normal.     Breath sounds: Normal breath sounds.  Musculoskeletal:     Right lower leg: No edema.     Left lower leg: No edema.  Neurological:     General: No focal deficit present.     Mental Status: She is alert and oriented to person, place, and time.  Psychiatric:        Mood and Affect: Mood normal.        Behavior: Behavior normal.    LABORATORY DATA:  I have  reviewed the labs as listed.  CBC Latest Ref Rng & Units 08/17/2021 07/12/2021 08/04/2020  WBC 4.0 - 10.5 K/uL 6.5 9.5 6.7  Hemoglobin 12.0 - 15.0 g/dL 14.4 13.9 13.9  Hematocrit 36.0 - 46.0 % 45.3 42.4 43.8  Platelets 150 - 400 K/uL 284 365 259   CMP Latest Ref Rng & Units 08/17/2021 08/04/2020 07/02/2020  Glucose 70 - 99 mg/dL 73 119(H) 85  BUN 8 - 23 mg/dL 12 12 10   Creatinine 0.44 - 1.00 mg/dL 0.78 0.93 1.01(H)  Sodium 135 - 145 mmol/L 141 142 142  Potassium 3.5 - 5.1 mmol/L 4.2 4.1 4.5  Chloride 98 - 111 mmol/L 106 107 106  CO2 22 - 32 mmol/L 27 27 22   Calcium 8.9 - 10.3 mg/dL 9.3 9.2 9.3  Total Protein 6.5 - 8.1 g/dL 7.7 7.3 7.1  Total Bilirubin 0.3 - 1.2 mg/dL 0.7 0.5 0.4  Alkaline Phos 38 - 126 U/L 79 76 96  AST 15 - 41 U/L 16 18 19   ALT 0 - 44 U/L 11 14 11       Component Value Date/Time   RBC 5.37 (H) 08/17/2021 1136   MCV 84.4 08/17/2021 1136   MCV 80 07/12/2021 1650   MCH 26.8 08/17/2021 1136   MCHC 31.8 08/17/2021 1136   RDW 14.3 08/17/2021 1136   RDW 13.3 07/12/2021 1650   LYMPHSABS 2.3 08/17/2021 1136   LYMPHSABS 1.6 07/12/2021 1650   MONOABS 0.5 08/17/2021 1136   EOSABS 0.1 08/17/2021 1136   EOSABS 0.0 07/12/2021 1650   BASOSABS 0.1 08/17/2021 1136   BASOSABS 0.0 07/12/2021 1650  DIAGNOSTIC IMAGING:  I have independently reviewed the scans and discussed with the patient. No results found.   ASSESSMENT:  1.  Recurrent thromboembolism: -Bilateral PE on 03/09/2016 after left hip replacement.  Dopplers were negative for DVT.  She was treated with Eliquis for a year. -Doppler on 11/08/2018 shows occlusive DVT involving left peroneal vein, new compared to Doppler from July 2017.  No extension to more proximal venous system.  This second episode is unprovoked. -Patient started on Eliquis 5 mg twice daily. -D-dimer elevated at 0.66 on 01/06/2020.  This was checked as she complained of right leg swelling. -Ultrasound of the right leg on January 07, 2020 with no evidence of  DVT.  Left common femoral vein is patent. - Repeat CT angiogram on January 16, 2020 for elevated D-dimer was negative for pulmonary embolism.   2.  Family history: -Mother had brain tumor.  Maternal grandmother had cervical cancer.  Paternal grandmother had colon cancer. -Maternal uncle had unprovoked DVT x2 in his 71s.   PLAN:  1.  Recurrent thromboembolism: - She is continuing to tolerate Eliquis without any bleeding problems. - She reports swelling of her left leg.  It is about an inch bigger in diameter compared to right leg, likely from prior DVT. - She was encouraged to wear compression socks (15-20 mmHg). - We have reviewed her labs which showed normal LFTs and CBC.  D-dimer was 0.52 (0-0 0.5). - She does not have any pleuritic chest pains or worsening shortness of breath. - At this time, the benefits of continuation of indefinite anticoagulation outweighs the risks. - Reevaluate risk-benefit ratio in 1 year.  Orders placed this encounter:  No orders of the defined types were placed in this encounter.    [name], MD [place] [phone]   I, Thana Ates, am acting as a scribe for Dr. [name].  I, [name] MD, have reviewed the above documentation for accuracy and completeness, and I agree with the above.

## 2021-08-24 NOTE — Patient Instructions (Addendum)
[facility] at [facility] Discharge Instructions  You were seen and examined today by Dr. Delton Coombes. He reviewed your most recent labs and everything looks good. Please keep follow up appointment as scheduled.   Thank you for choosing [facility] at [facility] to provide your oncology and hematology care.  To afford each patient quality time with our provider, please arrive at least 15 minutes before your scheduled appointment time.   If you have a lab appointment with the [facility] please come in thru the Main Entrance and check in at the main information desk.  You need to re-schedule your appointment should you arrive 10 or more minutes late.  We strive to give you quality time with our providers, and arriving late affects you and other patients whose appointments are after yours.  Also, if you no show three or more times for appointments you may be dismissed from the clinic at the providers discretion.     Again, thank you for choosing [facility].  Our hope is that these requests will decrease the amount of time that you wait before being seen by our physicians.       _____________________________________________________________  Should you have questions after your visit to [facility], please contact our office at [phone] and follow the prompts.  Our office hours are 8:00 a.m. and 4:30 p.m. Monday - Friday.  Please note that voicemails left after 4:00 p.m. may not be returned until the following business day.  We are closed weekends and major holidays.  You do have access to a nurse 24-7, just call the main number to the clinic [phone] and do not press any options, hold on the line and a nurse will answer the phone.    For prescription refill requests, have your pharmacy contact our office and allow 72 hours.    Due to Covid, you will need to wear a mask upon entering the hospital. If you do not have a  mask, a mask will be given to you at the Main Entrance upon arrival. For doctor visits, patients may have 1 support person age 48 or older with them. For treatment visits, patients can not have anyone with them due to social distancing guidelines and our immunocompromised population.

## 2021-08-25 DIAGNOSIS — M25552 Pain in left hip: Secondary | ICD-10-CM | POA: Diagnosis not present

## 2021-08-26 DIAGNOSIS — Z96642 Presence of left artificial hip joint: Secondary | ICD-10-CM | POA: Diagnosis not present

## 2021-09-02 ENCOUNTER — Telehealth: Payer: Self-pay | Admitting: Family Medicine

## 2021-09-02 DIAGNOSIS — E785 Hyperlipidemia, unspecified: Secondary | ICD-10-CM

## 2021-09-02 DIAGNOSIS — E039 Hypothyroidism, unspecified: Secondary | ICD-10-CM

## 2021-09-02 NOTE — Telephone Encounter (Signed)
Lipid, TSH, free T4-she can do these Hyperlipidemia hypothyroidism Patient just recently had comprehensive lab work through hematology so additional labs do not need to be completed at this time

## 2021-09-02 NOTE — Telephone Encounter (Signed)
Blood work ordered in Epic. Left message to return call to notify patient. 

## 2021-09-02 NOTE — Telephone Encounter (Signed)
Pt states she is supposed to have labs before her visit tomorrow 09/03/21 with Dr Nicki Reaper. There are no labs in the system. She stated she has to fast for the labs. I told her she could get them done after her appt tomorrow since her appt here is 8:40.  [phone]

## 2021-09-03 ENCOUNTER — Other Ambulatory Visit: Payer: Self-pay

## 2021-09-03 ENCOUNTER — Encounter: Payer: Self-pay | Admitting: Family Medicine

## 2021-09-03 ENCOUNTER — Ambulatory Visit (INDEPENDENT_AMBULATORY_CARE_PROVIDER_SITE_OTHER): Payer: Medicare HMO | Admitting: Family Medicine

## 2021-09-03 VITALS — BP 126/74 | Temp 98.2°F | Wt 218.8 lb

## 2021-09-03 DIAGNOSIS — D6869 Other thrombophilia: Secondary | ICD-10-CM | POA: Diagnosis not present

## 2021-09-03 DIAGNOSIS — E785 Hyperlipidemia, unspecified: Secondary | ICD-10-CM

## 2021-09-03 DIAGNOSIS — E039 Hypothyroidism, unspecified: Secondary | ICD-10-CM | POA: Diagnosis not present

## 2021-09-03 NOTE — Telephone Encounter (Signed)
Patient had appointment 09/03/21 with Dr Sallee Lange

## 2021-09-03 NOTE — Progress Notes (Signed)
° °  Subjective:    Patient ID: Wendy Marshall, female    DOB: 04/07/1956, 66 y.o.   MRN: [id number]  HPI Pt here for follow up. Pt would like to be checked for sugars along with her other labs. Pt is supposed to be on Prednisone for issues with hip replacement (possible infection) but pt would like to speak to PCP about it.  Patient was having significant hip pain in the left side.  She has had previous hip surgery bilateral.  She saw her specialist they recommended prednisone She is somewhat hesitant because she does not want to gain weight She is doing well otherwise taking her thyroid medicine feels like things are going well there taking her Eliquis lifelong not having any bleeding problems She is tolerating her cholesterol medicine. Review of Systems     Objective:   Physical Exam  Lungs are clear heart regular HEENT benign      Assessment & Plan:  Hypothyroidism check lab work continue medication Anticoagulation followed by specialist doing well with this She does have a hypercoagulability that she takes her medicine for Left hip pain and discomfort more than likely tendon related issue physical therapy could be helpful but she cannot afford it she will do prednisone but we will cut back the length of it to try to minimize side effect issues Cholesterol continue current medication check labs May need to adjust up the dose

## 2021-09-04 ENCOUNTER — Other Ambulatory Visit: Payer: Self-pay | Admitting: Family Medicine

## 2021-09-04 DIAGNOSIS — D6869 Other thrombophilia: Secondary | ICD-10-CM | POA: Insufficient documentation

## 2021-09-04 LAB — LIPID PANEL
Chol/HDL Ratio: 3.4 ratio (ref 0.0–4.4)
Cholesterol, Total: 200 mg/dL — ABNORMAL HIGH (ref 100–199)
HDL: 59 mg/dL (ref >39)
LDL Chol Calc (NIH): 115 mg/dL — ABNORMAL HIGH (ref 0–99)
Triglycerides: 149 mg/dL (ref 0–149)
VLDL Cholesterol Cal: 26 mg/dL (ref 5–40)

## 2021-09-04 LAB — T4, FREE: Free T4: 1.58 ng/dL (ref 0.82–1.77)

## 2021-09-04 LAB — TSH: TSH: 0.867 u[IU]/mL (ref 0.450–4.500)

## 2021-09-04 MED ORDER — LEVOTHYROXINE SODIUM 112 MCG PO TABS: ORAL_TABLET | ORAL | 6 refills | Status: DC

## 2021-09-07 DIAGNOSIS — D1801 Hemangioma of skin and subcutaneous tissue: Secondary | ICD-10-CM | POA: Diagnosis not present

## 2021-09-07 DIAGNOSIS — L821 Other seborrheic keratosis: Secondary | ICD-10-CM | POA: Diagnosis not present

## 2021-09-07 DIAGNOSIS — D2371 Other benign neoplasm of skin of right lower limb, including hip: Secondary | ICD-10-CM | POA: Diagnosis not present

## 2021-09-07 DIAGNOSIS — L720 Epidermal cyst: Secondary | ICD-10-CM | POA: Diagnosis not present

## 2021-09-07 DIAGNOSIS — L814 Other melanin hyperpigmentation: Secondary | ICD-10-CM | POA: Diagnosis not present

## 2021-09-07 DIAGNOSIS — D225 Melanocytic nevi of trunk: Secondary | ICD-10-CM | POA: Diagnosis not present

## 2021-09-09 DIAGNOSIS — R269 Unspecified abnormalities of gait and mobility: Secondary | ICD-10-CM | POA: Diagnosis not present

## 2021-09-09 DIAGNOSIS — G4733 Obstructive sleep apnea (adult) (pediatric): Secondary | ICD-10-CM | POA: Diagnosis not present

## 2021-09-10 ENCOUNTER — Other Ambulatory Visit: Payer: Self-pay

## 2021-09-10 DIAGNOSIS — E785 Hyperlipidemia, unspecified: Secondary | ICD-10-CM

## 2021-09-10 DIAGNOSIS — Z79899 Other long term (current) drug therapy: Secondary | ICD-10-CM

## 2021-09-10 DIAGNOSIS — E039 Hypothyroidism, unspecified: Secondary | ICD-10-CM

## 2021-09-10 MED ORDER — ROSUVASTATIN CALCIUM 10 MG PO TABS: 10.0000 mg | ORAL_TABLET | Freq: Every day | ORAL | 1 refills | Status: DC

## 2021-09-28 ENCOUNTER — Other Ambulatory Visit: Payer: Self-pay | Admitting: Family Medicine

## 2021-10-02 DIAGNOSIS — I871 Compression of vein: Secondary | ICD-10-CM | POA: Diagnosis not present

## 2021-11-16 ENCOUNTER — Encounter: Payer: Self-pay | Admitting: Family Medicine

## 2021-11-17 ENCOUNTER — Ambulatory Visit (INDEPENDENT_AMBULATORY_CARE_PROVIDER_SITE_OTHER): Payer: Medicare HMO | Admitting: Family Medicine

## 2021-11-17 ENCOUNTER — Telehealth: Payer: Self-pay | Admitting: *Deleted

## 2021-11-17 ENCOUNTER — Other Ambulatory Visit: Payer: Self-pay

## 2021-11-17 DIAGNOSIS — E785 Hyperlipidemia, unspecified: Secondary | ICD-10-CM

## 2021-11-17 MED ORDER — MOLNUPIRAVIR EUA 200MG CAPSULE: 4.0000 | ORAL_CAPSULE | Freq: Two times a day (BID) | ORAL | 0 refills | Status: AC

## 2021-11-17 MED ORDER — ROSUVASTATIN CALCIUM 5 MG PO TABS: 10.0000 mg | ORAL_TABLET | Freq: Every day | ORAL | 1 refills | Status: DC

## 2021-11-17 NOTE — Progress Notes (Signed)
? ?  Subjective:  ? ? Patient ID: Wendy Marshall, female    DOB: 1956/07/09, 66 y.o.   MRN: [id number] ? ?HPI ? ?Patient says she tested positive for COVID last night. She is having head congestion, fever, cough and a lot of drainage. No shortness of breath or trouble breathing. ?Patient with a lot of head congestion drainage coughing sore throat denies vomiting diarrhea or passing out symptoms over the past couple days ? ?Virtual Visit via Telephone Note ? ?I connected with Cyril Loosen on 11/17/21 at 11:40 AM EDT by telephone and verified that I am speaking with the correct person using two identifiers. ? ?Location: ?Patient: home ?Provider: office ?  ?I discussed the limitations, risks, security and privacy concerns of performing an evaluation and management service by telephone and the availability of in person appointments. I also discussed with the patient that there may be a patient responsible charge related to this service. The patient expressed understanding and agreed to proceed. ? ? ?History of Present Illness: ? ?  ?Observations/Objective: ? ? ?Assessment and Plan: ? ? ?Follow Up Instructions: ? ?  ?I discussed the assessment and treatment plan with the patient. The patient was provided an opportunity to ask questions and all were answered. The patient agreed with the plan and demonstrated an understanding of the instructions. ?  ?The patient was advised to call back or seek an in-person evaluation if the symptoms worsen or if the condition fails to improve as anticipated. ? ?I provided  15 minutes of non-face-to-face time during this encounter. ? ? ? ? ? ?Review of Systems ? ?   ?Objective:  ? Physical Exam ? ?Today's visit was via telephone ?Physical exam was not possible for this visit ? ? ? ?   ?Assessment & Plan:  ?COVID ?Treatment and warning signs discussed ? ?Covid infection ?This is a viral process.  Mild cases are treated with supportive measures at home such as Tylenol rest fluids.  In  some situations monoclonal antibodies may be appropriate depending on the patient's risk criteria.  The patient was educated regarding progressive illness including respiratory, persistent vomiting, change in mental status.  If any of these occur ER evaluation is recommended. ?Patient was educated about the following as well ?Covid-19 respiratory warning: ?Covid-19 is a virus that causes hypoxia (low oxygen level in blood) in some people. If you develop any changes in your usual breathing pattern: difficulty catching your breath, more short winded with activity or with resting, or anything that concerns you about your breathing, do not hesitate to go to the emergency department immediately for evaluation. Please do not delay to get treatment.  ? ?Agrees with plan of care discussed today. ?Understands warning signs to seek further care: Chest pain, shortness of breath, mental confusion, profuse vomiting, any significant change in health. ?Understands to follow-up if symptoms do not improve, or worsen.   ? ?1. Hyperlipidemia, unspecified hyperlipidemia type ?Patient did not tolerate higher dose Crestor go back to 5 mg should do her labs by early summer and follow-up late summer ?- rosuvastatin (CRESTOR) 5 MG tablet; Take 2 tablets (10 mg total) by mouth daily.  Dispense: 90 tablet; Refill: 1 ? ? ?

## 2021-11-17 NOTE — Telephone Encounter (Signed)
Ms. harnoor, reta are scheduled for a virtual visit with your provider today.   ? ?Just as we do with appointments in the office, we must obtain your consent to participate.  Your consent will be active for this visit and any virtual visit you may have with one of our providers in the next 365 days.   ? ?If you have a MyChart account, I can also send a copy of this consent to you electronically.  All virtual visits are billed to your insurance company just like a traditional visit in the office.  As this is a virtual visit, video technology does not allow for your provider to perform a traditional examination.  This may limit your provider's ability to fully assess your condition.  If your provider identifies any concerns that need to be evaluated in person or the need to arrange testing such as labs, EKG, etc, we will make arrangements to do so.   ? ?Although advances in technology are sophisticated, we cannot ensure that it will always work on either your end or our end.  If the connection with a video visit is poor, we may have to switch to a telephone visit.  With either a video or telephone visit, we are not always able to ensure that we have a secure connection.   I need to obtain your verbal consent now.   Are you willing to proceed with your visit today?  ? ?DEETYA DROUILLARD has provided verbal consent on 11/17/2021 for a virtual visit (video or telephone). ? ? ?Ned Card, LPN ?9/32/6712  45:80 AM ?  ?

## 2021-11-23 ENCOUNTER — Ambulatory Visit: Payer: Medicare HMO

## 2021-11-29 ENCOUNTER — Telehealth: Payer: Self-pay | Admitting: Family Medicine

## 2021-11-29 NOTE — Telephone Encounter (Signed)
Left message for patient to call back and schedule Medicare Annual Wellness Visit (AWV) in office.  ? ?If unable to come into the office for AWV,  please offer to do virtually or by telephone. ? ?Last AWV: 11/17/2020 ? ?Please schedule at anytime with [facility] Advisor. ? ?30 minute appointment for Virtual or phone ? ?45 minute appointment for in office or Initial virtual/phone ? ?Any questions, please contact me at [phone]  ?  ?  ?

## 2021-12-16 DIAGNOSIS — G4733 Obstructive sleep apnea (adult) (pediatric): Secondary | ICD-10-CM | POA: Diagnosis not present

## 2021-12-20 ENCOUNTER — Other Ambulatory Visit: Payer: Self-pay | Admitting: Family Medicine

## 2021-12-20 ENCOUNTER — Other Ambulatory Visit (HOSPITAL_COMMUNITY): Payer: Self-pay | Admitting: Hematology

## 2022-01-04 ENCOUNTER — Ambulatory Visit (INDEPENDENT_AMBULATORY_CARE_PROVIDER_SITE_OTHER): Payer: Medicare HMO

## 2022-01-04 VITALS — Ht 62.75 in | Wt 226.0 lb

## 2022-01-04 DIAGNOSIS — Z0000 Encounter for general adult medical examination without abnormal findings: Secondary | ICD-10-CM

## 2022-01-04 NOTE — Progress Notes (Signed)
Subjective:   Wendy Marshall is a 66 y.o. female who presents for Medicare Annual (Subsequent) preventive examination. IN OFFICE VISIT. Review of Systems     Cardiac Risk Factors include: advanced age (>47mn, >>28women);sedentary lifestyle;obesity (BMI >30kg/m2);Other (see comment), Risk factor comments: Back pain, fibromyalgia     Objective:    Today's Vitals   01/04/22 1333 01/04/22 1338  Weight: 226 lb (102.5 kg)   Height: 5' 2.75" (1.594 m)   PainSc:  3    Body mass index is 40.35 kg/m.     01/04/2022    1:51 PM 08/24/2021    3:01 PM 11/17/2020    8:29 AM 08/11/2020    2:52 PM 01/13/2020    2:41 PM 12/31/2018    1:16 PM 08/30/2017   10:47 AM  Advanced Directives  Does Patient Have a Medical Advance Directive? Yes Yes Yes Yes Yes Yes Yes  Type of AParamedicof APaloLiving will HPontotocLiving will HEast LibertyLiving will HWestlake VillageLiving will HWhiteLiving will Living will;Healthcare Power of Attorney Living will  Does patient want to make changes to medical advance directive?  No - Patient declined No - Patient declined No - Patient declined No - Patient declined No - Patient declined No - Patient declined  Copy of H[place]in Chart? No - copy requested No - copy requested No - copy requested No - copy requested No - copy requested No - copy requested   Would patient like information on creating a medical advance directive?      No - Patient declined     Current Medications (verified) Outpatient Encounter Medications as of 01/04/2022  Medication Sig   ELIQUIS 5 MG TABS tablet Take 1 tablet by mouth twice daily   levothyroxine (SYNTHROID) 112 MCG tablet TAKE 1/2 TABLET BY MOUTH ON MONDAY , FRIDAY AND A WHOLE TABLET ON ALL OTHER DAYS   Misc. Devices MISC Please provide patient with pair of compression stocking 15-20 mmHg Dx: IF02.637 DVT of left  peroneal vein   Multiple Vitamin (MULTIVITAMIN) tablet Take 1 tablet by mouth daily.   predniSONE (DELTASONE) 20 MG tablet 3 po qd x 3 d then 2 po qd x 3 d then 1 po qd x 2 d (Patient not taking: Reported on 09/03/2021)   rosuvastatin (CRESTOR) 5 MG tablet Take 2 tablets (10 mg total) by mouth daily.   No facility-administered encounter medications on file as of 01/04/2022.    Allergies (verified) Penicillins, Adhesive [tape], Contrast media [iodinated contrast media], Flexeril [cyclobenzaprine], Omnipaque [iohexol], Soma [carisoprodol], Metaxalone, Methocarbamol, Other, and Tizanidine   History: Past Medical History:  Diagnosis Date   Arthritis    "all over"   Chondromalacia of knee, right 085/8850  Complication of anesthesia    hard to wake up post-op   Dental crowns present    x 2   Difficulty swallowing pills    since cervical fusion   Family history of adverse reaction to anesthesia    pt's mother has hx. of being hard to wake up post-op   GERD (gastroesophageal reflux disease)    no current med.   Headache    x 3 days (08/18/2017)   High cholesterol    History of pulmonary embolus (PE) 03/09/2016   bilateral   Hypothyroidism    Limited joint range of motion (ROM)    cervical spine - s/p fusion   Medial meniscus tear  08/2017   right knee   Pulmonary embolism ([place])    Sleep apnea    uses CPAP nightly   Past Surgical History:  Procedure Laterality Date   ANTERIOR CERVICAL DECOMP/DISCECTOMY FUSION  07/18/2002   C5-6   CARPAL TUNNEL RELEASE Bilateral    CHONDROPLASTY Right 08/23/2017   Procedure: CHONDROPLASTY;  Surgeon: Frederik Pear, MD;  Location: [facility];  Service: Orthopedics;  Laterality: Right;   EYE MUSCLE SURGERY Bilateral    KNEE ARTHROSCOPY Right 08/23/2017   Procedure: ARTHROSCOPY RIGHT KNEE REMOVAL OF LOOSE BODIES;  Surgeon: Frederik Pear, MD;  Location: [place];  Service: Orthopedics;  Laterality: Right;   KNEE ARTHROSCOPY  WITH MEDIAL MENISECTOMY Right 08/23/2017   Procedure: KNEE ARTHROSCOPY WITH MEDIAL MENISECTOMY;  Surgeon: Frederik Pear, MD;  Location: [place];  Service: Orthopedics;  Laterality: Right;   KNEE SURGERY Right 03/2019   SHOULDER ARTHROSCOPY Left 06/11/2001   TONSILLECTOMY     age 52   TOTAL HIP ARTHROPLASTY Left 02/01/2016   Procedure: LEFT TOTAL HIP ARTHROPLASTY ANTERIOR APPROACH;  Surgeon: Paralee Cancel, MD;  Location: [facility];  Service: Orthopedics;  Laterality: Left;  Failed Spinal to LMA   TOTAL HIP ARTHROPLASTY Right 05/2019   TUBAL LIGATION     WISDOM TOOTH EXTRACTION     Family History  Problem Relation Age of Onset   Hypertension Mother    Anesthesia problems Mother        hard to wake up post-op   Brain cancer Father    Colon cancer Other    Heart attack Other    Throat cancer Other    Colon cancer Paternal Grandmother    Heart attack Maternal Grandfather    Throat cancer Maternal Grandfather    Social History   Socioeconomic History   Marital status: Married    Spouse name: Wendy Marshall   Number of children: 2   Years of education: Not on file   Highest education level: High school graduate  Occupational History   Occupation: Surveyor, quantity    Comment: retired  Tobacco Use   Smoking status: Former    Packs/day: 0.00    Years: 11.00    Pack years: 0.00    Types: Cigarettes    Quit date: 07/31/1996    Years since quitting: 25.4   Smokeless tobacco: Never  Vaping Use   Vaping Use: Never used  Substance and Sexual Activity   Alcohol use: Yes    Comment: rarely   Drug use: No   Sexual activity: Yes    Birth control/protection: None, Post-menopausal, Surgical    Comment: tubal  Other Topics Concern   Not on file  Social History Narrative   Lives with spouse   Social Determinants of Health   Financial Resource Strain: Low Risk    Difficulty of Paying Living Expenses: Not very hard  Food Insecurity: No Food Insecurity   Worried About Sales executive in the Last Year: Never true   [place] in the Last Year: Never true  Transportation Needs: No Transportation Needs   Lack of Transportation (Medical): No   Lack of Transportation (Non-Medical): No  Physical Activity: Insufficiently Active   Days of Exercise per Week: 3 days   Minutes of Exercise per Session: 30 min  Stress: Stress Concern Present   Feeling of Stress : To some extent  Social Connections: Socially Integrated   Frequency of Communication with Friends and Family: More than three times  a week   Frequency of Social Gatherings with Friends and Family: More than three times a week   Attends Religious Services: More than 4 times per year   Active Member of [employer] or Organizations: Yes   Attends Music therapist: More than 4 times per year   Marital Status: Married    Tobacco Counseling Counseling given: Not Answered   Clinical Intake:  Pre-visit preparation completed: Yes  Pain : 0-10 Pain Score: 3  Pain Type: Chronic pain Pain Location: Back Pain Descriptors / Indicators: Aching, Dull Pain Onset: More than a month ago Pain Frequency: Intermittent     BMI - recorded: 40.35 Nutritional Status: BMI > 30  Obese Nutritional Risks: None Diabetes: No  How often do you need to have someone help you when you read instructions, pamphlets, or other written materials from your doctor or pharmacy?: 1 - Never  Diabetic?NO  Interpreter Needed?: No  Information entered by :: mj Marquel Pottenger, lpn   Activities of Daily Living    01/04/2022    1:59 PM  In your present state of health, do you have any difficulty performing the following activities:  Hearing? 0  Vision? 0  Difficulty concentrating or making decisions? 0  Walking or climbing stairs? 0  Dressing or bathing? 0  Doing errands, shopping? 0  Preparing Food and eating ? N  Using the Toilet? N  In the past six months, have you accidently leaked urine? N  Do you have problems with loss of  bowel control? N  Managing your Medications? N  Managing your Finances? N  Housekeeping or managing your Housekeeping? N    Patient Care Team: [name], MD as PCP - General (Family Medicine)  Indicate any recent Medical Services you may have received from other than [facility] providers in the past year (date may be approximate).     Assessment:   This is a routine wellness examination for Zhoe.  Hearing/Vision screen Hearing Screening - Comments:: No hearing issues.  Vision Screening - Comments:: Glasses. Dr. Jorja Loa. 07/2021.  Dietary issues and exercise activities discussed: Current Exercise Habits: Structured exercise class, Type of exercise: stretching;Other - see comments (Exercise equipment at [employer] Time (Minutes): 30, Frequency (Times/Week): 3, Weekly Exercise (Minutes/Week): 90, Intensity: Mild, Exercise limited by: cardiac condition(s);orthopedic condition(s)   Goals Addressed             This Visit's Progress    Exercise 3x per week (30 min per time)   On track    01/04/22-Continue to exercise and stay active.        Depression Screen    01/04/2022    1:42 PM 11/17/2020    8:31 AM 07/06/2020    1:19 PM 09/10/2019    9:41 AM 08/27/2019   11:03 AM  PHQ 2/9 Scores  PHQ - 2 Score 1 0 2 0 1  PHQ- 9 Score   6      Fall Risk    01/04/2022    1:51 PM 11/17/2020    8:31 AM 09/10/2019    9:41 AM 08/27/2019   11:03 AM  Fall Risk   Falls in the past year? 0 0 0 0  Number falls in past yr: 0 0 0 0  Injury with Fall? 0 0 0 0  Risk for fall due to : No Fall Risks No Fall Risks    Follow up Falls prevention discussed Falls evaluation completed;Falls prevention discussed  Falls evaluation completed    FALL  RISK PREVENTION PERTAINING TO THE HOME:  Any stairs in or around the home? Yes  If so, are there any without handrails? No  Home free of loose throw rugs in walkways, pet beds, electrical cords, etc? Yes  Adequate lighting in your home to reduce risk of  falls? Yes   ASSISTIVE DEVICES UTILIZED TO PREVENT FALLS:  Life alert? No  Use of a cane, walker or w/c? Yes  Grab bars in the bathroom? No  Shower chair or bench in shower? No  Elevated toilet seat or a handicapped toilet? Yes   TIMED UP AND GO:  Was the test performed? Yes .  Length of time to ambulate 10 feet: 10 sec.   Gait steady and fast without use of assistive device  Cognitive Function:        01/04/2022    2:00 PM  6CIT Screen  What Year? 0 points  What month? 0 points  What time? 0 points  Count back from 20 0 points  Months in reverse 0 points  Repeat phrase 0 points  Total Score 0 points    Immunizations Immunization History  Administered Date(s) Administered   Influenza Inj Mdck Quad Pf 04/25/2019   Influenza,inj,Quad PF,6+ Mos 05/02/2015, 07/06/2020   Influenza-Unspecified 05/16/2016, 04/29/2019   PFIZER(Purple Top)SARS-COV-2 Vaccination 10/31/2019, 11/22/2019, 08/12/2020   Td 10/30/2006   Tdap 07/06/2020    TDAP status: Up to date  Flu Vaccine status: Up to date  Pneumococcal vaccine status: Up to date Due at age 65. Covid-19 vaccine status: Completed vaccines  Qualifies for Shingles Vaccine? Yes   Zostavax completed No   Shingrix Completed?: No.    Education has been provided regarding the importance of this vaccine. Patient has been advised to call insurance company to determine out of pocket expense if they have not yet received this vaccine. Advised may also receive vaccine at local pharmacy or Health Dept. Verbalized acceptance and understanding.  Screening Tests Health Maintenance  Topic Date Due   Zoster Vaccines- Shingrix (1 of 2) Never done   MAMMOGRAM  09/05/2019   COVID-19 Vaccine (4 - Booster for Pfizer series) 10/07/2020   INFLUENZA VACCINE  03/01/2022   PAP SMEAR-Modifier  09/09/2022   COLONOSCOPY (Pts 45-80yr Insurance coverage will need to be confirmed)  09/12/2024   TETANUS/TDAP  07/06/2030   Hepatitis C Screening   Completed   HIV Screening  Completed   HPV VACCINES  Aged Out    Health Maintenance  Health Maintenance Due  Topic Date Due   Zoster Vaccines- Shingrix (1 of 2) Never done   MAMMOGRAM  09/05/2019   COVID-19 Vaccine (4 - Booster for PPlaqueminesseries) 10/07/2020    Colorectal cancer screening: Type of screening: Colonoscopy. Completed 09/12/2014. Repeat every 10 years  Mammogram status: Completed 09/04/2018. Repeat every year  Bone Density status: Ordered DUE AT AGE 66 Pt provided with contact info and advised to call to schedule appt.  Lung Cancer Screening: (Low Dose CT Chest recommended if Age 64-80years, 30 pack-year currently smoking OR have quit w/in 15years.) does not qualify.  Additional Screening:  Hepatitis C Screening: does qualify; Completed 02/06/2019.  Vision Screening: Recommended annual ophthalmology exams for early detection of glaucoma and other disorders of the eye. Is the patient up to date with their annual eye exam?  Yes  Who is the provider or what is the name of the office in which the patient attends annual eye exams? DR. CJorja Loa If pt is not established with a provider,  would they like to be referred to a provider to establish care? No .   Dental Screening: Recommended annual dental exams for proper oral hygiene  Community Resource Referral / Chronic Care Management: CRR required this visit?  No   CCM required this visit?  No      Plan:     I have personally reviewed and noted the following in the patient's chart:   Medical and social history Use of alcohol, tobacco or illicit drugs  Current medications and supplements including opioid prescriptions.  Functional ability and status Nutritional status Physical activity Advanced directives List of other physicians Hospitalizations, surgeries, and ER visits in previous 12 months Vitals Screenings to include cognitive, depression, and falls Referrals and appointments  In addition, I have reviewed  and discussed with patient certain preventive protocols, quality metrics, and best practice recommendations. A written personalized care plan for preventive services as well as general preventive health recommendations were provided to patient.     Chriss Driver, LPN   12/04/9792   Nurse Notes: Discussed Shingrix and how to obtain.

## 2022-01-04 NOTE — Patient Instructions (Signed)
Wendy Marshall , Thank you for taking time to come for your Medicare Wellness Visit. I appreciate your ongoing commitment to your health goals. Please review the following plan we discussed and let me know if I can assist you in the future.   Screening recommendations/referrals: Colonoscopy: Done 09/12/2014 Repeat in 10 years  Mammogram: Done 09/04/2018. Call to schedule.  Bone Density: Due at age 66.  Recommended yearly ophthalmology/optometry visit for glaucoma screening and checkup Recommended yearly dental visit for hygiene and checkup  Vaccinations: Influenza vaccine: Due Fall 2023. Pneumococcal vaccine: Due at age 39. Tdap vaccine: Done 07/06/2020 Repeat in 10 years  Shingles vaccine: Discussed.   Covid-19: Done 08/12/2020, 11/22/2019, 10/31/2019.  Advanced directives: Please bring a copy of your health care power of attorney and living will to the office to be added to your chart at your convenience.   Conditions/risks identified: Aim for 30 minutes of exercise or brisk walking, 6-8 glasses of water, and 5 servings of fruits and vegetables each day.   Next appointment: Follow up in one year for your annual wellness visit. 2024.  Preventive Care 40-64 Years, Female Preventive care refers to lifestyle choices and visits with your health care provider that can promote health and wellness. What does preventive care include? A yearly physical exam. This is also called an annual well check. Dental exams once or twice a year. Routine eye exams. Ask your health care provider how often you should have your eyes checked. Personal lifestyle choices, including: Daily care of your teeth and gums. Regular physical activity. Eating a healthy diet. Avoiding tobacco and drug use. Limiting alcohol use. Practicing safe sex. Taking low-dose aspirin daily starting at age 51. Taking vitamin and mineral supplements as recommended by your health care provider. What happens during an annual well  check? The services and screenings done by your health care provider during your annual well check will depend on your age, overall health, lifestyle risk factors, and family history of disease. Counseling  Your health care provider may ask you questions about your: Alcohol use. Tobacco use. Drug use. Emotional well-being. Home and relationship well-being. Sexual activity. Eating habits. Work and work Statistician. Method of birth control. Menstrual cycle. Pregnancy history. Screening  You may have the following tests or measurements: Height, weight, and BMI. Blood pressure. Lipid and cholesterol levels. These may be checked every 5 years, or more frequently if you are over 71 years old. Skin check. Lung cancer screening. You may have this screening every year starting at age 65 if you have a 30-pack-year history of smoking and currently smoke or have quit within the past 15 years. Fecal occult blood test (FOBT) of the stool. You may have this test every year starting at age 31. Flexible sigmoidoscopy or colonoscopy. You may have a sigmoidoscopy every 5 years or a colonoscopy every 10 years starting at age 32. Hepatitis C blood test. Hepatitis B blood test. Sexually transmitted disease (STD) testing. Diabetes screening. This is done by checking your blood sugar (glucose) after you have not eaten for a while (fasting). You may have this done every 1-3 years. Mammogram. This may be done every 1-2 years. Talk to your health care provider about when you should start having regular mammograms. This may depend on whether you have a family history of breast cancer. BRCA-related cancer screening. This may be done if you have a family history of breast, ovarian, tubal, or peritoneal cancers. Pelvic exam and Pap test. This may be done every 3  years starting at age 54. Starting at age 18, this may be done every 5 years if you have a Pap test in combination with an HPV test. Bone density scan. This  is done to screen for osteoporosis. You may have this scan if you are at high risk for osteoporosis. Discuss your test results, treatment options, and if necessary, the need for more tests with your health care provider. Vaccines  Your health care provider may recommend certain vaccines, such as: Influenza vaccine. This is recommended every year. Tetanus, diphtheria, and acellular pertussis (Tdap, Td) vaccine. You may need a Td booster every 10 years. Zoster vaccine. You may need this after age 20. Pneumococcal 13-valent conjugate (PCV13) vaccine. You may need this if you have certain conditions and were not previously vaccinated. Pneumococcal polysaccharide (PPSV23) vaccine. You may need one or two doses if you smoke cigarettes or if you have certain conditions. Talk to your health care provider about which screenings and vaccines you need and how often you need them. This information is not intended to replace advice given to you by your health care provider. Make sure you discuss any questions you have with your health care provider. Document Released: 08/14/2015 Document Revised: 04/06/2016 Document Reviewed: 05/19/2015 Elsevier Interactive Patient Education  2017 [place] Prevention in the Home Falls can cause injuries. They can happen to people of all ages. There are many things you can do to make your home safe and to help prevent falls. What can I do on the outside of my home? Regularly fix the edges of walkways and driveways and fix any cracks. Remove anything that might make you trip as you walk through a door, such as a raised step or threshold. Trim any bushes or trees on the path to your home. Use bright outdoor lighting. Clear any walking paths of anything that might make someone trip, such as rocks or tools. Regularly check to see if handrails are loose or broken. Make sure that both sides of any steps have handrails. Any raised decks and porches should have  guardrails on the edges. Have any leaves, snow, or ice cleared regularly. Use sand or salt on walking paths during winter. Clean up any spills in your garage right away. This includes oil or grease spills. What can I do in the bathroom? Use night lights. Install grab bars by the toilet and in the tub and shower. Do not use towel bars as grab bars. Use non-skid mats or decals in the tub or shower. If you need to sit down in the shower, use a plastic, non-slip stool. Keep the floor dry. Clean up any water that spills on the floor as soon as it happens. Remove soap buildup in the tub or shower regularly. Attach bath mats securely with double-sided non-slip rug tape. Do not have throw rugs and other things on the floor that can make you trip. What can I do in the bedroom? Use night lights. Make sure that you have a light by your bed that is easy to reach. Do not use any sheets or blankets that are too big for your bed. They should not hang down onto the floor. Have a firm chair that has side arms. You can use this for support while you get dressed. Do not have throw rugs and other things on the floor that can make you trip. What can I do in the kitchen? Clean up any spills right away. Avoid walking on wet floors.  Keep items that you use a lot in easy-to-reach places. If you need to reach something above you, use a strong step stool that has a grab bar. Keep electrical cords out of the way. Do not use floor polish or wax that makes floors slippery. If you must use wax, use non-skid floor wax. Do not have throw rugs and other things on the floor that can make you trip. What can I do with my stairs? Do not leave any items on the stairs. Make sure that there are handrails on both sides of the stairs and use them. Fix handrails that are broken or loose. Make sure that handrails are as long as the stairways. Check any carpeting to make sure that it is firmly attached to the stairs. Fix any carpet  that is loose or worn. Avoid having throw rugs at the top or bottom of the stairs. If you do have throw rugs, attach them to the floor with carpet tape. Make sure that you have a light switch at the top of the stairs and the bottom of the stairs. If you do not have them, ask someone to add them for you. What else can I do to help prevent falls? Wear shoes that: Do not have high heels. Have rubber bottoms. Are comfortable and fit you well. Are closed at the toe. Do not wear sandals. If you use a stepladder: Make sure that it is fully opened. Do not climb a closed stepladder. Make sure that both sides of the stepladder are locked into place. Ask someone to hold it for you, if possible. Clearly mark and make sure that you can see: Any grab bars or handrails. First and last steps. Where the edge of each step is. Use tools that help you move around (mobility aids) if they are needed. These include: Canes. Walkers. Scooters. Crutches. Turn on the lights when you go into a dark area. Replace any light bulbs as soon as they burn out. Set up your furniture so you have a clear path. Avoid moving your furniture around. If any of your floors are uneven, fix them. If there are any pets around you, be aware of where they are. Review your medicines with your doctor. Some medicines can make you feel dizzy. This can increase your chance of falling. Ask your doctor what other things that you can do to help prevent falls. This information is not intended to replace advice given to you by your health care provider. Make sure you discuss any questions you have with your health care provider. Document Released: 05/14/2009 Document Revised: 12/24/2015 Document Reviewed: 08/22/2014 Elsevier Interactive Patient Education  2017 [employer].

## 2022-01-13 ENCOUNTER — Other Ambulatory Visit (HOSPITAL_COMMUNITY): Payer: Self-pay | Admitting: Hematology

## 2022-01-16 DIAGNOSIS — G4733 Obstructive sleep apnea (adult) (pediatric): Secondary | ICD-10-CM | POA: Diagnosis not present

## 2022-01-19 ENCOUNTER — Emergency Department (HOSPITAL_COMMUNITY): Payer: Medicare HMO

## 2022-01-19 ENCOUNTER — Emergency Department (HOSPITAL_COMMUNITY)
Admission: EM | Admit: 2022-01-19 | Discharge: 2022-01-19 | Disposition: A | Discharge status: Home / Self Care | Payer: Medicare HMO | Attending: Emergency Medicine | Admitting: Emergency Medicine

## 2022-01-19 ENCOUNTER — Other Ambulatory Visit: Payer: Self-pay

## 2022-01-19 ENCOUNTER — Encounter (HOSPITAL_COMMUNITY): Payer: Self-pay

## 2022-01-19 DIAGNOSIS — M25531 Pain in right wrist: Secondary | ICD-10-CM | POA: Diagnosis not present

## 2022-01-19 DIAGNOSIS — Z7901 Long term (current) use of anticoagulants: Secondary | ICD-10-CM | POA: Diagnosis not present

## 2022-01-19 DIAGNOSIS — M542 Cervicalgia: Secondary | ICD-10-CM | POA: Insufficient documentation

## 2022-01-19 DIAGNOSIS — M79601 Pain in right arm: Secondary | ICD-10-CM | POA: Diagnosis not present

## 2022-01-19 DIAGNOSIS — M25511 Pain in right shoulder: Secondary | ICD-10-CM | POA: Diagnosis not present

## 2022-01-19 DIAGNOSIS — Y92410 Unspecified street and highway as the place of occurrence of the external cause: Secondary | ICD-10-CM | POA: Diagnosis not present

## 2022-01-19 DIAGNOSIS — R519 Headache, unspecified: Secondary | ICD-10-CM | POA: Insufficient documentation

## 2022-01-19 MED ORDER — LIDOCAINE 5 % EX PTCH: 1.0000 | MEDICATED_PATCH | CUTANEOUS | 0 refills | Status: DC

## 2022-01-19 MED ORDER — KETOROLAC TROMETHAMINE 15 MG/ML IJ SOLN
15.0000 mg | Freq: Once | INTRAMUSCULAR | Status: AC
  Administered 2022-01-19: 15 mg via INTRAMUSCULAR
  Filled 2022-01-19: qty 1
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
## 2022-01-19 NOTE — Discharge Instructions (Signed)
Please take Tylenol as needed for pain.  I going to write you a prescription for Lidoderm patches.  You can use these every 12 hours.  Like you to follow-up with your primary care doctor or orthopedics in the event this does not get better.  Please return to the emergency department for any worsening symptoms you might have like we discussed.
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
## 2022-01-20 DIAGNOSIS — Z79899 Other long term (current) drug therapy: Secondary | ICD-10-CM | POA: Diagnosis not present

## 2022-01-20 DIAGNOSIS — E039 Hypothyroidism, unspecified: Secondary | ICD-10-CM | POA: Diagnosis not present

## 2022-01-20 DIAGNOSIS — E785 Hyperlipidemia, unspecified: Secondary | ICD-10-CM | POA: Diagnosis not present

## 2022-01-21 LAB — LIPID PANEL
Chol/HDL Ratio: 3.1 ratio (ref 0.0–4.4)
Cholesterol, Total: 169 mg/dL (ref 100–199)
HDL: 55 mg/dL (ref >39)
LDL Chol Calc (NIH): 92 mg/dL (ref 0–99)
Triglycerides: 123 mg/dL (ref 0–149)
VLDL Cholesterol Cal: 22 mg/dL (ref 5–40)

## 2022-01-21 LAB — BASIC METABOLIC PANEL
BUN/Creatinine Ratio: 11 — ABNORMAL LOW (ref 12–28)
BUN: 11 mg/dL (ref 8–27)
CO2: 23 mmol/L (ref 20–29)
Calcium: 9.6 mg/dL (ref 8.7–10.3)
Chloride: 106 mmol/L (ref 96–106)
Creatinine, Ser: 0.98 mg/dL (ref 0.57–1.00)
Glucose: 81 mg/dL (ref 70–99)
Potassium: 4.5 mmol/L (ref 3.5–5.2)
Sodium: 142 mmol/L (ref 134–144)
eGFR: 64 mL/min/{1.73_m2} (ref >59)

## 2022-01-21 LAB — TSH: TSH: 1.09 u[IU]/mL (ref 0.450–4.500)

## 2022-01-21 LAB — HEPATIC FUNCTION PANEL
ALT: 13 IU/L (ref 0–32)
AST: 17 IU/L (ref 0–40)
Albumin: 4.9 g/dL — ABNORMAL HIGH (ref 3.8–4.8)
Alkaline Phosphatase: 95 IU/L (ref 44–121)
Bilirubin Total: 0.5 mg/dL (ref 0.0–1.2)
Bilirubin, Direct: 0.12 mg/dL (ref 0.00–0.40)
Total Protein: 7.3 g/dL (ref 6.0–8.5)

## 2022-01-31 ENCOUNTER — Encounter: Payer: Self-pay | Admitting: Family Medicine

## 2022-01-31 ENCOUNTER — Ambulatory Visit (INDEPENDENT_AMBULATORY_CARE_PROVIDER_SITE_OTHER): Payer: Medicare HMO | Admitting: Family Medicine

## 2022-01-31 VITALS — BP 117/78 | Temp 97.5°F | Wt 226.6 lb

## 2022-01-31 DIAGNOSIS — M546 Pain in thoracic spine: Secondary | ICD-10-CM

## 2022-01-31 DIAGNOSIS — M5412 Radiculopathy, cervical region: Secondary | ICD-10-CM | POA: Diagnosis not present

## 2022-01-31 DIAGNOSIS — M25531 Pain in right wrist: Secondary | ICD-10-CM | POA: Diagnosis not present

## 2022-01-31 MED ORDER — TRAMADOL HCL 50 MG PO TABS: ORAL_TABLET | ORAL | 0 refills | Status: DC
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
## 2022-02-03 ENCOUNTER — Ambulatory Visit (HOSPITAL_COMMUNITY)
Admission: RE | Admit: 2022-02-03 | Discharge: 2022-02-03 | Disposition: A | Discharge status: Home / Self Care | Payer: Medicare HMO | Source: Ambulatory Visit | Attending: Family Medicine | Admitting: Family Medicine

## 2022-02-03 DIAGNOSIS — M47814 Spondylosis without myelopathy or radiculopathy, thoracic region: Secondary | ICD-10-CM | POA: Diagnosis not present

## 2022-02-03 DIAGNOSIS — M546 Pain in thoracic spine: Secondary | ICD-10-CM | POA: Diagnosis not present

## 2022-02-03 DIAGNOSIS — M5412 Radiculopathy, cervical region: Secondary | ICD-10-CM | POA: Diagnosis not present

## 2022-02-04 ENCOUNTER — Other Ambulatory Visit: Payer: Self-pay | Admitting: Family Medicine

## 2022-02-07 ENCOUNTER — Other Ambulatory Visit: Payer: Self-pay | Admitting: Family Medicine

## 2022-02-07 DIAGNOSIS — M546 Pain in thoracic spine: Secondary | ICD-10-CM

## 2022-02-07 DIAGNOSIS — M5412 Radiculopathy, cervical region: Secondary | ICD-10-CM

## 2022-02-15 DIAGNOSIS — G4733 Obstructive sleep apnea (adult) (pediatric): Secondary | ICD-10-CM | POA: Diagnosis not present

## 2022-02-16 ENCOUNTER — Telehealth: Payer: Self-pay | Admitting: Family Medicine

## 2022-02-16 NOTE — Telephone Encounter (Signed)
Patient informed of md message and recommendations. Verbalized understanding. Appointment made for 03/02/22 at 9:20a

## 2022-02-16 NOTE — Telephone Encounter (Signed)
Nurses Loma Sousa tried to get MRI approved.  It stated that insurance company is stating she must be under physician care plan for 6 weeks before they will approve MRI.  Essentially in this situation has already been 4 weeks so I would recommend that we see her back in 2 weeks time and we should be able to get the MRI.  Certainly if her situation has totally improved by the time she comes back in 2 weeks may not need the MRI but given MRI stipulations of the insurance company this is what we will need to do.  Please set up for a follow-up visit in 2 weeks time.  Please explain the situation to the patient.  If there is any significant worsening of the neck weakness other problems let me know otherwise we are stuck with the above thank you

## 2022-03-02 ENCOUNTER — Encounter: Payer: Self-pay | Admitting: Family Medicine

## 2022-03-02 ENCOUNTER — Ambulatory Visit (INDEPENDENT_AMBULATORY_CARE_PROVIDER_SITE_OTHER): Payer: Medicare HMO | Admitting: Family Medicine

## 2022-03-02 VITALS — BP 137/79 | HR 75 | Wt 225.6 lb

## 2022-03-02 DIAGNOSIS — M5412 Radiculopathy, cervical region: Secondary | ICD-10-CM

## 2022-03-02 MED ORDER — TRAMADOL HCL 50 MG PO TABS: ORAL_TABLET | ORAL | 0 refills | Status: DC

## 2022-03-02 NOTE — Progress Notes (Signed)
   Subjective:    Patient ID: Wendy Marshall, female    DOB: 06-Dec-1955, 66 y.o.   MRN: [id number]  HPI Pt arrives to follow up on right arm and shoulder pain. Pt states arm pain is better. Shoulder and neck pain has not improved. Pt reports "terrible muscle spasms". Pt has been using Tramadol; takes the edge off.  Patient relates still having ongoing discomfort.  The pain is better into the arm but not gone she has pain down the arm into the elbow she also has significant Jaw pain  headaches Review of Systems     Objective:   Physical Exam  Tenderness bilateral trapezius and right-sided rhomboid tenderness in the shoulder and into the arm reflexes are good strength is good increased pain with turning of the neck and tilting of the neck to the right side lungs clear heart regular      Assessment & Plan:  1. Cervical radiculopathy Go ahead with MRI This raises into question if she will end up needing to have surgery versus injections She has tried conservative therapy. Her symptoms have been ongoing since June 21. She was seen on 01/31/2022. She has had previous neck surgery Given her ongoing symptoms MRI is indicated Patient relates allergic reaction to IV contrast for CT scan I believe the contrast is different for MRI and we will check with MRI technician  Also patient relates that she started having muscle pains discomfort with 10 mg cholesterol medicine she stopped taking it about a week ago patient will see how things go over the next week or 2 then let us know we may try the medication back at 5 mg or perhaps a different statin if she does not tolerate those then we would be leaning toward Repatha

## 2022-03-08 ENCOUNTER — Telehealth: Payer: Self-pay | Admitting: Family Medicine

## 2022-03-08 DIAGNOSIS — M5412 Radiculopathy, cervical region: Secondary | ICD-10-CM

## 2022-03-08 NOTE — Telephone Encounter (Signed)
Marena Chancy, MD Cc: Vicente Males, LPN; Owens Shark, Zebedee Iba, RN; Anastasio Auerbach R, LPN Good Morning Dr. Wolfgang Phoenix - while trying to start PA for this Patient it states " there has been a non-certified request for this same procedure within with last 60 days" - This means you will need to call and do a Physician Courtesy Review, It will not let me proceed until this has been done. You may call [phone] and use Reference #: [id number].   Thanks for your help concerning this :)

## 2022-03-10 NOTE — Telephone Encounter (Signed)
Nurses-please let the patient know that I have tried 2 separate times to get this approved first time it was denied in this particular time they would not engage in approving it because this is been within 60 days of previous ordering-which is a archaic rule by the insurance company.  Simply put despite talking with them currently right now they will not approve it.  I would highly recommend that we go ahead and refer her onward to neurosurgery and it would be wise when the referral was made to let neurosurgery know that her insurance company was given a difficult time approving the MRI through [place] and they would have a better chance of getting it approved because they are specialist.  If for some reason they are unable to get it approved I would recommend that we wait 30 more days and then resubmit for another MRI.  Please discussed with patient.  Please go ahead with referral.  If any additional questions issues please let me know thank you

## 2022-03-10 NOTE — Telephone Encounter (Signed)
When I called this number I followed all of the prompts.  Once I put in the patient's reference ID their date of birth and everything else the automated machine stated to me that all situations for this patient have been exhausted and that denial letter will not be changed but I can still request a peer to peer review but it will not change anything.  So in this situation not sure what should be done I suppose the following options Option #1-wait an additional 3 to 4 weeks then resubmit a request for the MRI? Option #2-declare that we have tried and unfortunately insurance company is blocking this and just go ahead and refer her to a specialist and request that the specialist tried to get the MRI from their request/submission to the insurance company  Do you have any thoughts on which path we should go?  Thank you for your help

## 2022-03-11 NOTE — Addendum Note (Signed)
Addended by: Vicente Males on: 03/11/2022 04:33 PM   Modules accepted: Orders

## 2022-03-11 NOTE — Telephone Encounter (Signed)
Pt contacted and verbalized understanding. Referral placed

## 2022-03-16 DIAGNOSIS — G4733 Obstructive sleep apnea (adult) (pediatric): Secondary | ICD-10-CM | POA: Diagnosis not present

## 2022-03-20 ENCOUNTER — Other Ambulatory Visit: Payer: Self-pay | Admitting: Family Medicine

## 2022-04-11 DIAGNOSIS — Z7901 Long term (current) use of anticoagulants: Secondary | ICD-10-CM | POA: Diagnosis not present

## 2022-04-11 DIAGNOSIS — I2782 Chronic pulmonary embolism: Secondary | ICD-10-CM | POA: Diagnosis not present

## 2022-04-11 DIAGNOSIS — Z833 Family history of diabetes mellitus: Secondary | ICD-10-CM | POA: Diagnosis not present

## 2022-04-11 DIAGNOSIS — Z8249 Family history of ischemic heart disease and other diseases of the circulatory system: Secondary | ICD-10-CM | POA: Diagnosis not present

## 2022-04-11 DIAGNOSIS — Z86718 Personal history of other venous thrombosis and embolism: Secondary | ICD-10-CM | POA: Diagnosis not present

## 2022-04-11 DIAGNOSIS — Z6841 Body Mass Index (BMI) 40.0 and over, adult: Secondary | ICD-10-CM | POA: Diagnosis not present

## 2022-04-11 DIAGNOSIS — G3184 Mild cognitive impairment, so stated: Secondary | ICD-10-CM | POA: Diagnosis not present

## 2022-04-11 DIAGNOSIS — Z87891 Personal history of nicotine dependence: Secondary | ICD-10-CM | POA: Diagnosis not present

## 2022-04-11 DIAGNOSIS — E039 Hypothyroidism, unspecified: Secondary | ICD-10-CM | POA: Diagnosis not present

## 2022-04-11 DIAGNOSIS — G8929 Other chronic pain: Secondary | ICD-10-CM | POA: Diagnosis not present
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
## 2022-04-14 ENCOUNTER — Ambulatory Visit: Payer: Medicare HMO | Admitting: Orthopedic Surgery

## 2022-04-16 DIAGNOSIS — G4733 Obstructive sleep apnea (adult) (pediatric): Secondary | ICD-10-CM | POA: Diagnosis not present
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
## 2022-04-22 ENCOUNTER — Encounter: Payer: Self-pay | Admitting: Orthopedic Surgery

## 2022-04-22 ENCOUNTER — Ambulatory Visit: Payer: Medicare HMO | Admitting: Orthopedic Surgery

## 2022-04-22 VITALS — BP 143/86 | HR 94 | Ht 62.0 in | Wt 224.4 lb

## 2022-04-22 DIAGNOSIS — M5412 Radiculopathy, cervical region: Secondary | ICD-10-CM

## 2022-04-22 DIAGNOSIS — R202 Paresthesia of skin: Secondary | ICD-10-CM | POA: Diagnosis not present

## 2022-04-22 DIAGNOSIS — Z981 Arthrodesis status: Secondary | ICD-10-CM

## 2022-04-22 DIAGNOSIS — R29898 Other symptoms and signs involving the musculoskeletal system: Secondary | ICD-10-CM | POA: Diagnosis not present

## 2022-05-02 ENCOUNTER — Ambulatory Visit (INDEPENDENT_AMBULATORY_CARE_PROVIDER_SITE_OTHER): Payer: Medicare HMO | Admitting: Family Medicine

## 2022-05-02 VITALS — BP 106/72 | HR 97 | Temp 98.8°F | Ht 62.0 in | Wt 222.0 lb

## 2022-05-02 DIAGNOSIS — J019 Acute sinusitis, unspecified: Secondary | ICD-10-CM | POA: Diagnosis not present

## 2022-05-02 DIAGNOSIS — B349 Viral infection, unspecified: Secondary | ICD-10-CM | POA: Diagnosis not present

## 2022-05-02 MED ORDER — DOXYCYCLINE HYCLATE 100 MG PO TABS: 100.0000 mg | ORAL_TABLET | Freq: Two times a day (BID) | ORAL | 0 refills | Status: DC

## 2022-05-02 NOTE — Progress Notes (Signed)
   Subjective:    Patient ID: Wendy Marshall, female    DOB: 09-06-1955, 66 y.o.   MRN: [id number]  HPI Headache, fever, fatigue, congestion, cough -productive, throat is a little sore X 2 days Sweats and chills fever 101 Covid test at home was negative Some wheezing  Review of Systems     Objective:   Physical Exam Gen-NAD not toxic TMS-normal bilateral T- normal no redness Chest-CTA respiratory rate normal no crackles CV RRR no murmur Skin-warm dry Neuro-grossly normal       Assessment & Plan:  Viral syndrome Secondary rhinosinusitis Antibiotic for 7 days COVID test taken Await the results Supportive measures discussed in detail Patient to rest up Stay relatively self isolate and warning signs discussed

## 2022-05-04 LAB — NOVEL CORONAVIRUS, NAA: SARS-CoV-2, NAA: NOT DETECTED

## 2022-05-13 ENCOUNTER — Other Ambulatory Visit (HOSPITAL_COMMUNITY): Payer: Self-pay | Admitting: Family Medicine

## 2022-05-13 ENCOUNTER — Other Ambulatory Visit: Payer: Self-pay | Admitting: Orthopedic Surgery

## 2022-05-13 ENCOUNTER — Ambulatory Visit (HOSPITAL_COMMUNITY)
Admission: RE | Admit: 2022-05-13 | Discharge: 2022-05-13 | Disposition: A | Discharge status: Home / Self Care | Payer: Medicare HMO | Source: Ambulatory Visit | Attending: Orthopedic Surgery | Admitting: Orthopedic Surgery

## 2022-05-13 DIAGNOSIS — M5412 Radiculopathy, cervical region: Secondary | ICD-10-CM

## 2022-05-13 DIAGNOSIS — M47812 Spondylosis without myelopathy or radiculopathy, cervical region: Secondary | ICD-10-CM | POA: Diagnosis not present

## 2022-05-13 DIAGNOSIS — Z1231 Encounter for screening mammogram for malignant neoplasm of breast: Secondary | ICD-10-CM

## 2022-05-13 DIAGNOSIS — R29898 Other symptoms and signs involving the musculoskeletal system: Secondary | ICD-10-CM | POA: Diagnosis not present

## 2022-05-13 DIAGNOSIS — Z981 Arthrodesis status: Secondary | ICD-10-CM

## 2022-05-13 DIAGNOSIS — M4802 Spinal stenosis, cervical region: Secondary | ICD-10-CM | POA: Diagnosis not present

## 2022-05-13 DIAGNOSIS — M542 Cervicalgia: Secondary | ICD-10-CM | POA: Diagnosis not present

## 2022-05-13 DIAGNOSIS — Z9889 Other specified postprocedural states: Secondary | ICD-10-CM | POA: Diagnosis not present

## 2022-05-16 DIAGNOSIS — G4733 Obstructive sleep apnea (adult) (pediatric): Secondary | ICD-10-CM | POA: Diagnosis not present

## 2022-05-17 ENCOUNTER — Encounter: Payer: Self-pay | Admitting: Orthopedic Surgery

## 2022-05-17 NOTE — Progress Notes (Signed)
Cervical xrays dated 05/13/22:  FINDINGS: Seven cervical segments are well visualized. Changes of prior fusion are seen at C5-6 with anterior fixation. Osteophytic changes are noted in the remaining disc spaces. Facet hypertrophic changes are seen as well. Flexion and extension views show no significant instability. No soft tissue changes are seen. The odontoid is within normal limits.   IMPRESSION: Postsurgical and degenerative changes without definitive instability.     Electronically Signed   By: Inez Catalina M.D.   On: 05/15/2022 23:24   Cervical MRI dated 05/13/22:  FINDINGS: Alignment: There is mild anterolisthesis of C6 on C7, unchanged. Alignment is otherwise normal.   Vertebrae: Vertebral body heights are preserved. Background marrow signal is normal. There is a focus of ill-defined T1 hypointensity and patchy T2/STIR hyperintensity in the T2 vertebral body favored to reflect an atypical hemangioma and present on prior CT going back to 2017. There is no suspicious signal abnormality. There is no marrow edema.   Postsurgical changes reflecting C5-C6 ACDF are again seen with evidence of solid fusion on prior CT.   Cord: Normal in signal and morphology.   Posterior Fossa, vertebral arteries, paraspinal tissues: The imaged posterior fossa is unremarkable. The vertebral artery flow voids are normal. The paraspinal soft tissues are unremarkable.   Disc levels:   C2-C3: There is left worse than right uncovertebral ridging and facet arthropathy resulting in moderate left and no significant right neural foraminal stenosis and no significant spinal canal stenosis.   C3-C4: There is prominent bilateral uncovertebral ridging and mild facet arthropathy resulting in severe left and moderate right neural foraminal stenosis and mild spinal canal stenosis with effacement of the ventral thecal sac.   C4-C5: There is bilateral uncovertebral ridging and facet arthropathy  resulting in severe left and moderate to severe right neural foraminal stenosis and mild spinal canal stenosis with effacement of the ventral thecal sac   C5-C6: Status post ACDF without significant spinal canal or neural foraminal stenosis   C6-C7: There is mild anterolisthesis with an associated shallow posterior disc osteophyte complex, degenerative endplate change, and facet arthropathy resulting in moderate right and mild left neural foraminal stenosis without significant spinal canal stenosis   C7-T1: No significant spinal canal or neural foraminal stenosis.   IMPRESSION: 1. Status post C5-C6 ACDF without residual spinal canal or neural foraminal stenosis. 2. Adjacent segment disease at C4-C5 resulting in severe left and moderate right neural foraminal stenosis and mild spinal canal stenosis, and at C6-C7 resulting in moderate right and mild left neural foraminal stenosis. 3. Moderate left neural foraminal stenosis at C2-C3, and severe left and moderate right neural foraminal stenosis and mild spinal canal stenosis at C3-C4.     Electronically Signed   By: Valetta Mole M.D.   On: 05/15/2022 16:11  I have personally reviewed the images and agree with the above interpretation.   Neck pain likely multifactorial. Weakness in left arm may be pain mediated or may be due to foraminal stenosis C4-C5.   Recommend PT for cervical spine and possible cervical injections with Dr. Nelva Bush at [facility].   Will set up phone call visit to discuss with her.

## 2022-05-20 ENCOUNTER — Ambulatory Visit (HOSPITAL_COMMUNITY)
Admission: RE | Admit: 2022-05-20 | Discharge: 2022-05-20 | Disposition: A | Discharge status: Home / Self Care | Payer: Medicare HMO | Source: Ambulatory Visit | Attending: Family Medicine | Admitting: Family Medicine

## 2022-05-20 DIAGNOSIS — Z1231 Encounter for screening mammogram for malignant neoplasm of breast: Secondary | ICD-10-CM | POA: Diagnosis not present

## 2022-05-24 ENCOUNTER — Other Ambulatory Visit (HOSPITAL_COMMUNITY): Payer: Self-pay | Admitting: Family Medicine

## 2022-05-24 DIAGNOSIS — R928 Other abnormal and inconclusive findings on diagnostic imaging of breast: Secondary | ICD-10-CM

## 2022-05-26 ENCOUNTER — Ambulatory Visit (HOSPITAL_COMMUNITY)
Admission: RE | Admit: 2022-05-26 | Discharge: 2022-05-26 | Disposition: A | Discharge status: Home / Self Care | Payer: Medicare HMO | Source: Ambulatory Visit | Attending: Family Medicine | Admitting: Family Medicine

## 2022-05-26 DIAGNOSIS — R92322 Mammographic fibroglandular density, left breast: Secondary | ICD-10-CM | POA: Diagnosis not present

## 2022-05-26 DIAGNOSIS — R928 Other abnormal and inconclusive findings on diagnostic imaging of breast: Secondary | ICD-10-CM

## 2022-05-26 DIAGNOSIS — N6002 Solitary cyst of left breast: Secondary | ICD-10-CM | POA: Diagnosis not present
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
## 2022-05-31 ENCOUNTER — Ambulatory Visit (INDEPENDENT_AMBULATORY_CARE_PROVIDER_SITE_OTHER): Payer: Medicare HMO | Admitting: Orthopedic Surgery

## 2022-05-31 ENCOUNTER — Encounter: Payer: Self-pay | Admitting: Orthopedic Surgery

## 2022-05-31 DIAGNOSIS — M47812 Spondylosis without myelopathy or radiculopathy, cervical region: Secondary | ICD-10-CM

## 2022-05-31 DIAGNOSIS — Z981 Arthrodesis status: Secondary | ICD-10-CM

## 2022-06-05 ENCOUNTER — Other Ambulatory Visit: Payer: Self-pay | Admitting: Family Medicine

## 2022-06-07 DIAGNOSIS — H5203 Hypermetropia, bilateral: Secondary | ICD-10-CM | POA: Diagnosis not present

## 2022-06-16 DIAGNOSIS — H52221 Regular astigmatism, right eye: Secondary | ICD-10-CM | POA: Diagnosis not present

## 2022-06-16 DIAGNOSIS — H524 Presbyopia: Secondary | ICD-10-CM | POA: Diagnosis not present

## 2022-06-18 ENCOUNTER — Other Ambulatory Visit: Payer: Self-pay | Admitting: Family Medicine

## 2022-06-20 DIAGNOSIS — G4733 Obstructive sleep apnea (adult) (pediatric): Secondary | ICD-10-CM | POA: Diagnosis not present
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
## 2022-07-14 ENCOUNTER — Ambulatory Visit: Payer: Medicare HMO | Admitting: Orthopedic Surgery

## 2022-07-14 ENCOUNTER — Encounter: Payer: Self-pay | Admitting: Orthopedic Surgery

## 2022-07-14 DIAGNOSIS — M4802 Spinal stenosis, cervical region: Secondary | ICD-10-CM

## 2022-07-14 DIAGNOSIS — M4722 Other spondylosis with radiculopathy, cervical region: Secondary | ICD-10-CM | POA: Diagnosis not present

## 2022-07-14 DIAGNOSIS — M47812 Spondylosis without myelopathy or radiculopathy, cervical region: Secondary | ICD-10-CM

## 2022-07-14 DIAGNOSIS — M5412 Radiculopathy, cervical region: Secondary | ICD-10-CM

## 2022-07-14 DIAGNOSIS — Z981 Arthrodesis status: Secondary | ICD-10-CM

## 2022-07-14 NOTE — Patient Instructions (Signed)
It was so nice to see you today, I am sorry that you are still hurting so much.   You have some wear and tear above your fusion and this is likely contributing to your pain.   I sent a referral to the [facility] for cervical injections. Dr. Sharlet Salina, Dr. Alba Destine, and their PA Loree Fee are great and will take good care of you. They should call you to schedule or you can call them at [phone].   I also put in orders for physical therapy at [facility] in [place]. They should call you to schedule or you can call them at [phone],   I will see you back in 6 weeks. Please do not hesitate to call if you have any questions or concerns. You can also message me in [place].   If you have not heard back about any of the tests/procedures in the next week, please call the office so we can help you get these things scheduled.   Geronimo Boot PA-C [phone]

## 2022-07-20 DIAGNOSIS — G4733 Obstructive sleep apnea (adult) (pediatric): Secondary | ICD-10-CM | POA: Diagnosis not present

## 2022-07-20 NOTE — Telephone Encounter (Signed)
Can we reach out to someone at [facility] PMR to see if they can contact her?   Thanks.

## 2022-07-27 ENCOUNTER — Ambulatory Visit (HOSPITAL_COMMUNITY): Payer: Medicare HMO | Attending: Orthopedic Surgery | Admitting: Physical Therapy

## 2022-07-27 ENCOUNTER — Encounter (HOSPITAL_COMMUNITY): Payer: Self-pay | Admitting: Physical Therapy

## 2022-07-27 DIAGNOSIS — M6281 Muscle weakness (generalized): Secondary | ICD-10-CM | POA: Diagnosis not present

## 2022-07-27 DIAGNOSIS — M47812 Spondylosis without myelopathy or radiculopathy, cervical region: Secondary | ICD-10-CM | POA: Diagnosis not present

## 2022-07-27 DIAGNOSIS — M542 Cervicalgia: Secondary | ICD-10-CM | POA: Insufficient documentation

## 2022-07-27 DIAGNOSIS — M4802 Spinal stenosis, cervical region: Secondary | ICD-10-CM | POA: Insufficient documentation

## 2022-07-27 DIAGNOSIS — M5412 Radiculopathy, cervical region: Secondary | ICD-10-CM | POA: Insufficient documentation

## 2022-07-27 DIAGNOSIS — R29898 Other symptoms and signs involving the musculoskeletal system: Secondary | ICD-10-CM | POA: Insufficient documentation

## 2022-07-27 DIAGNOSIS — Z981 Arthrodesis status: Secondary | ICD-10-CM | POA: Insufficient documentation
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
## 2022-08-02 ENCOUNTER — Encounter: Payer: Self-pay | Admitting: *Deleted

## 2022-08-02 ENCOUNTER — Ambulatory Visit (HOSPITAL_COMMUNITY): Payer: Medicare HMO | Attending: Orthopedic Surgery | Admitting: Physical Therapy

## 2022-08-02 DIAGNOSIS — M6281 Muscle weakness (generalized): Secondary | ICD-10-CM | POA: Diagnosis not present

## 2022-08-02 DIAGNOSIS — M542 Cervicalgia: Secondary | ICD-10-CM | POA: Diagnosis not present

## 2022-08-02 DIAGNOSIS — R29898 Other symptoms and signs involving the musculoskeletal system: Secondary | ICD-10-CM | POA: Diagnosis not present
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
## 2022-08-08 ENCOUNTER — Encounter (HOSPITAL_COMMUNITY): Payer: Self-pay | Admitting: Physical Therapy

## 2022-08-08 ENCOUNTER — Ambulatory Visit (HOSPITAL_COMMUNITY): Payer: Medicare HMO | Admitting: Physical Therapy

## 2022-08-08 DIAGNOSIS — M6281 Muscle weakness (generalized): Secondary | ICD-10-CM | POA: Diagnosis not present

## 2022-08-08 DIAGNOSIS — M542 Cervicalgia: Secondary | ICD-10-CM

## 2022-08-08 DIAGNOSIS — R29898 Other symptoms and signs involving the musculoskeletal system: Secondary | ICD-10-CM
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
## 2022-08-15 ENCOUNTER — Inpatient Hospital Stay: Payer: Medicare HMO | Attending: Hematology

## 2022-08-15 DIAGNOSIS — Z808 Family history of malignant neoplasm of other organs or systems: Secondary | ICD-10-CM | POA: Diagnosis not present

## 2022-08-15 DIAGNOSIS — E039 Hypothyroidism, unspecified: Secondary | ICD-10-CM | POA: Insufficient documentation

## 2022-08-15 DIAGNOSIS — Z86718 Personal history of other venous thrombosis and embolism: Secondary | ICD-10-CM | POA: Diagnosis not present

## 2022-08-15 DIAGNOSIS — Z8049 Family history of malignant neoplasm of other genital organs: Secondary | ICD-10-CM | POA: Insufficient documentation

## 2022-08-15 DIAGNOSIS — Z91041 Radiographic dye allergy status: Secondary | ICD-10-CM | POA: Insufficient documentation

## 2022-08-15 DIAGNOSIS — Z886 Allergy status to analgesic agent status: Secondary | ICD-10-CM | POA: Insufficient documentation

## 2022-08-15 DIAGNOSIS — R002 Palpitations: Secondary | ICD-10-CM | POA: Diagnosis not present

## 2022-08-15 DIAGNOSIS — R519 Headache, unspecified: Secondary | ICD-10-CM | POA: Insufficient documentation

## 2022-08-15 DIAGNOSIS — Z8249 Family history of ischemic heart disease and other diseases of the circulatory system: Secondary | ICD-10-CM | POA: Insufficient documentation

## 2022-08-15 DIAGNOSIS — Z800 Family history of malignant neoplasm of digestive organs: Secondary | ICD-10-CM | POA: Diagnosis not present

## 2022-08-15 DIAGNOSIS — Z79899 Other long term (current) drug therapy: Secondary | ICD-10-CM | POA: Insufficient documentation

## 2022-08-15 DIAGNOSIS — I82452 Acute embolism and thrombosis of left peroneal vein: Secondary | ICD-10-CM

## 2022-08-15 DIAGNOSIS — R5383 Other fatigue: Secondary | ICD-10-CM | POA: Insufficient documentation

## 2022-08-15 DIAGNOSIS — Z981 Arthrodesis status: Secondary | ICD-10-CM | POA: Insufficient documentation

## 2022-08-15 DIAGNOSIS — Z880 Allergy status to penicillin: Secondary | ICD-10-CM | POA: Diagnosis not present

## 2022-08-15 DIAGNOSIS — Z7901 Long term (current) use of anticoagulants: Secondary | ICD-10-CM | POA: Insufficient documentation

## 2022-08-15 DIAGNOSIS — M549 Dorsalgia, unspecified: Secondary | ICD-10-CM | POA: Insufficient documentation

## 2022-08-15 DIAGNOSIS — Z86711 Personal history of pulmonary embolism: Secondary | ICD-10-CM | POA: Insufficient documentation

## 2022-08-15 LAB — CBC WITH DIFFERENTIAL/PLATELET
Abs Immature Granulocytes: 0.01 10*3/uL (ref 0.00–0.07)
Basophils Absolute: 0 10*3/uL (ref 0.0–0.1)
Basophils Relative: 1 %
Eosinophils Absolute: 0.2 10*3/uL (ref 0.0–0.5)
Eosinophils Relative: 3 %
HCT: 44.6 % (ref 36.0–46.0)
Hemoglobin: 13.9 g/dL (ref 12.0–15.0)
Immature Granulocytes: 0 %
Lymphocytes Relative: 36 %
Lymphs Abs: 2.1 10*3/uL (ref 0.7–4.0)
MCH: 26.4 pg (ref 26.0–34.0)
MCHC: 31.2 g/dL (ref 30.0–36.0)
MCV: 84.6 fL (ref 80.0–100.0)
Monocytes Absolute: 0.4 10*3/uL (ref 0.1–1.0)
Monocytes Relative: 7 %
Neutro Abs: 3.1 10*3/uL (ref 1.7–7.7)
Neutrophils Relative %: 53 %
Platelets: 247 10*3/uL (ref 150–400)
RBC: 5.27 MIL/uL — ABNORMAL HIGH (ref 3.87–5.11)
RDW: 13.9 % (ref 11.5–15.5)
WBC: 5.8 10*3/uL (ref 4.0–10.5)
nRBC: 0 % (ref 0.0–0.2)

## 2022-08-15 LAB — D-DIMER, QUANTITATIVE: D-Dimer, Quant: 0.43 ug/mL-FEU (ref 0.00–0.50)

## 2022-08-15 LAB — COMPREHENSIVE METABOLIC PANEL
ALT: 12 U/L (ref 0–44)
AST: 18 U/L (ref 15–41)
Albumin: 4.1 g/dL (ref 3.5–5.0)
Alkaline Phosphatase: 73 U/L (ref 38–126)
Anion gap: 7 (ref 5–15)
BUN: 13 mg/dL (ref 8–23)
CO2: 26 mmol/L (ref 22–32)
Calcium: 8.9 mg/dL (ref 8.9–10.3)
Chloride: 105 mmol/L (ref 98–111)
Creatinine, Ser: 0.98 mg/dL (ref 0.44–1.00)
GFR, Estimated: >60 mL/min (ref >60)
Glucose, Bld: 104 mg/dL — ABNORMAL HIGH (ref 70–99)
Potassium: 3.9 mmol/L (ref 3.5–5.1)
Sodium: 138 mmol/L (ref 135–145)
Total Bilirubin: 0.6 mg/dL (ref 0.3–1.2)
Total Protein: 7.4 g/dL (ref 6.5–8.1)

## 2022-08-16 ENCOUNTER — Encounter (HOSPITAL_COMMUNITY): Payer: Medicare HMO | Admitting: Physical Therapy

## 2022-08-18 DIAGNOSIS — M5412 Radiculopathy, cervical region: Secondary | ICD-10-CM | POA: Diagnosis not present

## 2022-08-18 DIAGNOSIS — M4802 Spinal stenosis, cervical region: Secondary | ICD-10-CM | POA: Diagnosis not present

## 2022-08-20 DIAGNOSIS — G4733 Obstructive sleep apnea (adult) (pediatric): Secondary | ICD-10-CM | POA: Diagnosis not present

## 2022-08-21 NOTE — Progress Notes (Signed)
[name], [place] [address]   CLINIC:  Medical Oncology/Hematology  PCP:  Wendy Drown, MD [address] [place] [address] [phone]   REASON FOR VISIT:  Follow-up for recurrent thromboembolism  PRIOR THERAPY: None  CURRENT THERAPY: Eliquis 5 mg twice daily  INTERVAL HISTORY:   [name] 67 y.o. female returns for routine follow-up of her recurrent thromboembolisms.  She was last seen by Dr. Delton Marshall on 08/24/2021.  At today's visit, she reports feeling well.  No recent hospitalizations, surgeries, or changes in baseline health status.  She continues to take Eliquis twice daily and is tolerating these well without any signs of major bleeding events.  She has not had any interval DVT or PE since her last visit.  She denies any unilateral leg swelling, pain, and erythema. No new shortness of breath, dyspnea on exertion, chest pain, cough, hemoptysis, and palpitations.  She has 50% energy and 100% appetite. She endorses that she is maintaining a stable weight.  ASSESSMENT & PLAN:  1.  Recurrent thromboembolism: - Bilateral PE on 03/09/2016 after left hip replacement.  Dopplers were negative for DVT.  She was treated with Eliquis for a year. - Doppler on 11/08/2018 shows occlusive DVT involving left peroneal vein, new compared to Doppler from July 2017.  No extension to more proximal venous system.  This second episode is unprovoked. - Patient has been taking Eliquis since April 2020.  Tolerating well without any bleeding problems.   - She has post thrombotic changes of her left leg, which is about an inch bigger in diameter compared to right leg, likely from prior DVT.  She was encouraged to wear compression socks (15-20 mmHg). - We have reviewed her labs from 08/15/2022 which showed normal LFTs and CBC.  D-dimer was normal at 0.43 - She does not have any pleuritic chest pains or worsening shortness of breath. - PLAN:  Benefits of indefinite anticoagulation continue to outweigh the risks.  RTC in 1 year   2.  Family history: - Mother had brain tumor.  Maternal grandmother had cervical cancer.  Paternal grandmother had colon cancer. - Maternal uncle had unprovoked DVT x2 in his 78s. -- Paternal great-grandmother deceased due to pulmonary embolism in her 29s  PLAN SUMMARY: >> Same-day labs (CBC/D, CMP, D-dimer) + OFFICE visit in 1 year     REVIEW OF SYSTEMS:   Review of Systems  Constitutional:  Positive for fatigue. Negative for appetite change, chills, diaphoresis, fever and unexpected weight change.  HENT:   Negative for lump/mass and nosebleeds.   Eyes:  Negative for eye problems.  Respiratory:  Negative for cough, hemoptysis and shortness of breath.   Cardiovascular:  Positive for palpitations. Negative for chest pain and leg swelling.  Gastrointestinal:  Negative for abdominal pain, blood in stool, constipation, diarrhea, nausea and vomiting.  Genitourinary:  Negative for hematuria.   Musculoskeletal:  Positive for back pain.  Skin: Negative.   Neurological:  Positive for headaches. Negative for dizziness and light-headedness.  Hematological:  Does not bruise/bleed easily.     PHYSICAL EXAM:  ECOG PERFORMANCE STATUS: 1 - Symptomatic but completely ambulatory  There were no vitals filed for this visit. There were no vitals filed for this visit. Physical Exam Constitutional:      Appearance: Normal appearance. She is obese.  HENT:     Head: Normocephalic and atraumatic.     Mouth/Throat:     Mouth: Mucous membranes are moist.  Eyes:     Extraocular Movements: Extraocular movements intact.     Pupils: Pupils are equal, round, and reactive to light.  Cardiovascular:     Rate and Rhythm: Normal rate and regular rhythm.     Pulses: Normal pulses.     Heart sounds: Normal heart sounds.  Pulmonary:     Effort: Pulmonary effort is normal.     Breath sounds: Normal breath sounds.   Abdominal:     General: Bowel sounds are normal.     Palpations: Abdomen is soft.     Tenderness: There is no abdominal tenderness.  Musculoskeletal:        General: No swelling.     Right lower leg: No edema.     Left lower leg: No edema.  Lymphadenopathy:     Cervical: No cervical adenopathy.  Skin:    General: Skin is warm and dry.  Neurological:     General: No focal deficit present.     Mental Status: She is alert and oriented to person, place, and time.  Psychiatric:        Mood and Affect: Mood normal.        Behavior: Behavior normal.    PAST MEDICAL/SURGICAL HISTORY:  Past Medical History:  Diagnosis Date   Arthritis    "all over"   Chondromalacia of knee, right 32/9518   Complication of anesthesia    hard to wake up post-op   Dental crowns present    x 2   Difficulty swallowing pills    since cervical fusion   Family history of adverse reaction to anesthesia    pt's mother has hx. of being hard to wake up post-op   GERD (gastroesophageal reflux disease)    no current med.   Headache    x 3 days (08/18/2017)   High cholesterol    History of pulmonary embolus (PE) 03/09/2016   bilateral   Hypothyroidism    Limited joint range of motion (ROM)    cervical spine - s/p fusion   Medial meniscus tear 08/2017   right knee   Pulmonary embolism ([place])    Sleep apnea    uses CPAP nightly   Past Surgical History:  Procedure Laterality Date   ANTERIOR CERVICAL DECOMP/DISCECTOMY FUSION  07/18/2002   C5-6   CARPAL TUNNEL RELEASE Bilateral 2004   CHONDROPLASTY Right 08/23/2017   Procedure: CHONDROPLASTY;  Surgeon: Frederik Pear, MD;  Location: [facility];  Service: Orthopedics;  Laterality: Right;   EYE MUSCLE SURGERY Bilateral    KNEE ARTHROSCOPY Right 08/23/2017   Procedure: ARTHROSCOPY RIGHT KNEE REMOVAL OF LOOSE BODIES;  Surgeon: Frederik Pear, MD;  Location: [facility];  Service: Orthopedics;  Laterality: Right;   KNEE ARTHROSCOPY  WITH MEDIAL MENISECTOMY Right 08/23/2017   Procedure: KNEE ARTHROSCOPY WITH MEDIAL MENISECTOMY;  Surgeon: Frederik Pear, MD;  Location: [facility];  Service: Orthopedics;  Laterality: Right;   KNEE SURGERY Right 03/2019   SHOULDER ARTHROSCOPY Left 06/11/2001   TONSILLECTOMY     age 4   TOTAL HIP ARTHROPLASTY Left 02/01/2016   Procedure: LEFT TOTAL HIP ARTHROPLASTY ANTERIOR APPROACH;  Surgeon: Paralee Cancel, MD;  Location: [facility];  Service: Orthopedics;  Laterality: Left;  Failed Spinal to LMA   TOTAL HIP ARTHROPLASTY Right 05/2019   TUBAL LIGATION     WISDOM TOOTH EXTRACTION      SOCIAL HISTORY:  Social History   Socioeconomic History   Marital status: Married    Spouse  name: [name]   Number of children: 2   Years of education: Not on file   Highest education level: High school graduate  Occupational History   Occupation: Production    Comment: retired  Tobacco Use   Smoking status: Former    Packs/day: 0.00    Years: 11.00    Total pack years: 0.00    Types: Cigarettes    Quit date: 07/31/1996    Years since quitting: 26.0   Smokeless tobacco: Never  Vaping Use   Vaping Use: Never used  Substance and Sexual Activity   Alcohol use: Yes    Comment: rarely   Drug use: No   Sexual activity: Yes    Birth control/protection: None, Post-menopausal, Surgical    Comment: tubal  Other Topics Concern   Not on file  Social History Narrative   Lives with spouse   Social Determinants of Health   Financial Resource Strain: Low Risk  (01/04/2022)   Overall Financial Resource Strain (CARDIA)    Difficulty of Paying Living Expenses: Not very hard  Food Insecurity: No Food Insecurity (01/04/2022)   Hunger Vital Sign    Worried About Running Out of Food in the Last Year: Never true    [place] in the Last Year: Never true  Transportation Needs: No Transportation Needs (01/04/2022)   PRAPARE - Hydrologist (Medical): No    Lack of  Transportation (Non-Medical): No  Physical Activity: Insufficiently Active (01/04/2022)   Exercise Vital Sign    Days of Exercise per Week: 3 days    Minutes of Exercise per Session: 30 min  Stress: Stress Concern Present (01/04/2022)   [place]    Feeling of Stress : To some extent  Social Connections: Socially Integrated (01/04/2022)   Social Connection and Isolation Panel [NHANES]    Frequency of Communication with Friends and Family: More than three times a week    Frequency of Social Gatherings with Friends and Family: More than three times a week    Attends Religious Services: More than 4 times per year    Active Member of [employer] or Organizations: Yes    Attends Music therapist: More than 4 times per year    Marital Status: Married  Human resources officer Violence: Not At Risk (01/04/2022)   Humiliation, Afraid, Rape, and Kick questionnaire    Fear of Current or Ex-Partner: No    Emotionally Abused: No    Physically Abused: No    Sexually Abused: No    FAMILY HISTORY:  Family History  Problem Relation Age of Onset   Hypertension Mother    Anesthesia problems Mother        hard to wake up post-op   Brain cancer Father    Colon cancer Other    Heart attack Other    Throat cancer Other    Colon cancer Paternal Grandmother    Heart attack Maternal Grandfather    Throat cancer Maternal Grandfather     CURRENT MEDICATIONS:  Outpatient Encounter Medications as of 08/22/2022  Medication Sig   Coenzyme Q10 (COQ10 PO) Take by mouth.   ELIQUIS 5 MG TABS tablet Take 1 tablet by mouth twice daily (Patient taking differently: Take 5 mg by mouth 2 (two) times daily.)   levothyroxine (SYNTHROID) 112 MCG tablet TAKE 1/2 TABLET BY MOUTH ON MONDAY, FRIDAY, AND A WHOLE TABLET ON ALL OTHER DAYS   lidocaine (LIDODERM) 5 %  Place 1 patch onto the skin daily. Remove & Discard patch within 12 hours or as directed by MD    Misc. Devices MISC Please provide patient with pair of compression stocking 15-20 mmHg Dx: H85.277- DVT of left peroneal vein   Multiple Vitamin (MULTIVITAMIN) tablet Take 1 tablet by mouth daily.   rosuvastatin (CRESTOR) 10 MG tablet Take 1 tablet by mouth once daily   Turmeric (QC TUMERIC COMPLEX PO) Take by mouth as needed.   No facility-administered encounter medications on file as of 08/22/2022.    ALLERGIES:  Allergies  Allergen Reactions   Penicillins Hives, Shortness Of Breath and Other (See Comments)   Adhesive [Tape] Other (See Comments)    SKIN TEARS   Contrast Media [Iodinated Contrast Media] Hives    With CT contrast dye   Flexeril [Cyclobenzaprine] Other (See Comments)    AFFECTED HEART RATE   Omnipaque [Iohexol] Hives   Soma [Carisoprodol] Other (See Comments)    AFFECTED HEART RATE   Metaxalone Other (See Comments)   Methocarbamol Other (See Comments)   Nsaids Other (See Comments)   Other Other (See Comments)    Muscle relaxer- heart problems    Tizanidine Other (See Comments)    LABORATORY DATA:  I have reviewed the labs as listed.  CBC    Component Value Date/Time   WBC 5.8 08/15/2022 1408   RBC 5.27 (H) 08/15/2022 1408   HGB 13.9 08/15/2022 1408   HGB 13.9 07/12/2021 1650   HCT 44.6 08/15/2022 1408   HCT 42.4 07/12/2021 1650   PLT 247 08/15/2022 1408   PLT 365 07/12/2021 1650   MCV 84.6 08/15/2022 1408   MCV 80 07/12/2021 1650   MCH 26.4 08/15/2022 1408   MCHC 31.2 08/15/2022 1408   RDW 13.9 08/15/2022 1408   RDW 13.3 07/12/2021 1650   LYMPHSABS 2.1 08/15/2022 1408   LYMPHSABS 1.6 07/12/2021 1650   MONOABS 0.4 08/15/2022 1408   EOSABS 0.2 08/15/2022 1408   EOSABS 0.0 07/12/2021 1650   BASOSABS 0.0 08/15/2022 1408   BASOSABS 0.0 07/12/2021 1650      Latest Ref Rng & Units 08/15/2022    2:08 PM 01/20/2022   11:26 AM 08/17/2021   11:36 AM  CMP  Glucose 70 - 99 mg/dL 104  81  73   BUN 8 - 23 mg/dL '13  11  12   '$ Creatinine 0.44 - 1.00 mg/dL  0.98  0.98  0.78   Sodium 135 - 145 mmol/L 138  142  141   Potassium 3.5 - 5.1 mmol/L 3.9  4.5  4.2   Chloride 98 - 111 mmol/L 105  106  106   CO2 22 - 32 mmol/L '26  23  27   '$ Calcium 8.9 - 10.3 mg/dL 8.9  9.6  9.3   Total Protein 6.5 - 8.1 g/dL 7.4  7.3  7.7   Total Bilirubin 0.3 - 1.2 mg/dL 0.6  0.5  0.7   Alkaline Phos 38 - 126 U/L 73  95  79   AST 15 - 41 U/L '18  17  16   '$ ALT 0 - 44 U/L '12  13  11     '$ DIAGNOSTIC IMAGING:  I have independently reviewed the relevant imaging and discussed with the patient.   WRAP UP:  All questions were answered. The patient knows to call the clinic with any problems, questions or concerns.  Medical decision making: Low  Time spent on visit: I spent 15 minutes counseling the patient  face to face. The total time spent in the appointment was 22 minutes and more than 50% was on counseling.  Harriett Rush, PA-C  08/22/22 3:19 PM

## 2022-08-22 ENCOUNTER — Other Ambulatory Visit: Payer: Self-pay

## 2022-08-22 ENCOUNTER — Inpatient Hospital Stay (HOSPITAL_BASED_OUTPATIENT_CLINIC_OR_DEPARTMENT_OTHER): Payer: Medicare HMO | Admitting: Physician Assistant

## 2022-08-22 VITALS — BP 140/74 | HR 92 | Temp 98.2°F | Resp 18 | Ht 63.0 in | Wt 221.5 lb

## 2022-08-22 DIAGNOSIS — Z91041 Radiographic dye allergy status: Secondary | ICD-10-CM | POA: Diagnosis not present

## 2022-08-22 DIAGNOSIS — Z7901 Long term (current) use of anticoagulants: Secondary | ICD-10-CM

## 2022-08-22 DIAGNOSIS — I82452 Acute embolism and thrombosis of left peroneal vein: Secondary | ICD-10-CM

## 2022-08-22 DIAGNOSIS — R002 Palpitations: Secondary | ICD-10-CM | POA: Diagnosis not present

## 2022-08-22 DIAGNOSIS — R5383 Other fatigue: Secondary | ICD-10-CM | POA: Diagnosis not present

## 2022-08-22 DIAGNOSIS — Z880 Allergy status to penicillin: Secondary | ICD-10-CM | POA: Diagnosis not present

## 2022-08-22 DIAGNOSIS — R519 Headache, unspecified: Secondary | ICD-10-CM | POA: Diagnosis not present

## 2022-08-22 DIAGNOSIS — M549 Dorsalgia, unspecified: Secondary | ICD-10-CM | POA: Diagnosis not present

## 2022-08-22 DIAGNOSIS — Z886 Allergy status to analgesic agent status: Secondary | ICD-10-CM | POA: Diagnosis not present

## 2022-08-22 DIAGNOSIS — Z8249 Family history of ischemic heart disease and other diseases of the circulatory system: Secondary | ICD-10-CM | POA: Diagnosis not present

## 2022-08-22 DIAGNOSIS — Z86711 Personal history of pulmonary embolism: Secondary | ICD-10-CM | POA: Diagnosis not present

## 2022-08-22 DIAGNOSIS — Z981 Arthrodesis status: Secondary | ICD-10-CM | POA: Diagnosis not present

## 2022-08-22 DIAGNOSIS — E039 Hypothyroidism, unspecified: Secondary | ICD-10-CM | POA: Diagnosis not present

## 2022-08-22 DIAGNOSIS — Z800 Family history of malignant neoplasm of digestive organs: Secondary | ICD-10-CM | POA: Diagnosis not present

## 2022-08-22 DIAGNOSIS — Z79899 Other long term (current) drug therapy: Secondary | ICD-10-CM | POA: Diagnosis not present

## 2022-08-22 DIAGNOSIS — Z808 Family history of malignant neoplasm of other organs or systems: Secondary | ICD-10-CM | POA: Diagnosis not present

## 2022-08-22 DIAGNOSIS — Z86718 Personal history of other venous thrombosis and embolism: Secondary | ICD-10-CM | POA: Diagnosis not present

## 2022-08-22 DIAGNOSIS — Z8049 Family history of malignant neoplasm of other genital organs: Secondary | ICD-10-CM | POA: Diagnosis not present

## 2022-08-22 NOTE — Patient Instructions (Signed)
[facility] **   You were seen today by Tarri Abernethy PA-C for your history of blood clots.   Due to high risk of recurrent blood clot if you were to stop taking blood thinner, I recommend that you continue taking Eliquis 5 mg twice daily. Seek IMMEDIATE medical attention if you have any symptoms of abnormal bleeding while taking Eliquis.  FOLLOW-UP APPOINTMENT: Same-day labs and office visit in 1 year  ** Thank you for trusting me with your healthcare!  I strive to provide all of my patients with quality care at each visit.  If you receive a survey for this visit, I would be so grateful to you for taking the time to provide feedback.  Thank you in advance!  ~ Kiora Hallberg                   Dr. [name]   &   Tarri Abernethy, PA-C   - - - - - - - - - - - - - - - - - -    Thank you for choosing [facility] to provide your oncology and hematology care.  To afford each patient quality time with our provider, please arrive at least 15 minutes before your scheduled appointment time.   If you have a lab appointment with the [facility] please come in thru the Main Entrance and check in at the main information desk.  You need to re-schedule your appointment should you arrive 10 or more minutes late.  We strive to give you quality time with our providers, and arriving late affects you and other patients whose appointments are after yours.  Also, if you no show three or more times for appointments you may be dismissed from the clinic at the providers discretion.     Again, thank you for choosing [facility].  Our hope is that these requests will decrease the amount of time that you wait before being seen by our physicians.       _____________________________________________________________  Should you have questions after your visit to [facility], please contact our office at [phone] and follow the prompts.  Our office hours are 8:00 a.m. and 4:30 p.m. Monday - Friday.  Please note that voicemails left after 4:00 p.m. may not be returned until the following business day.  We are closed weekends and major holidays.  You do have access to a nurse 24-7, just call the main number to the clinic [phone] and do not press any options, hold on the line and a nurse will answer the phone.    For prescription refill requests, have your pharmacy contact our office and allow 72 hours.

## 2022-08-23 ENCOUNTER — Encounter (HOSPITAL_COMMUNITY): Payer: Medicare HMO | Admitting: Physical Therapy

## 2022-08-29 ENCOUNTER — Ambulatory Visit (HOSPITAL_COMMUNITY): Payer: Medicare HMO | Admitting: Physical Therapy

## 2022-08-29 ENCOUNTER — Encounter (HOSPITAL_COMMUNITY): Payer: Self-pay | Admitting: Physical Therapy

## 2022-08-29 DIAGNOSIS — M6281 Muscle weakness (generalized): Secondary | ICD-10-CM | POA: Diagnosis not present

## 2022-08-29 DIAGNOSIS — M542 Cervicalgia: Secondary | ICD-10-CM

## 2022-08-29 DIAGNOSIS — R29898 Other symptoms and signs involving the musculoskeletal system: Secondary | ICD-10-CM | POA: Diagnosis not present
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
## 2022-08-31 ENCOUNTER — Other Ambulatory Visit: Payer: Self-pay | Admitting: Family Medicine

## 2022-09-07 ENCOUNTER — Telehealth: Payer: Self-pay | Admitting: Family Medicine

## 2022-09-07 ENCOUNTER — Ambulatory Visit (HOSPITAL_COMMUNITY): Payer: Medicare HMO | Attending: Orthopedic Surgery

## 2022-09-07 DIAGNOSIS — M6281 Muscle weakness (generalized): Secondary | ICD-10-CM | POA: Diagnosis not present

## 2022-09-07 DIAGNOSIS — M542 Cervicalgia: Secondary | ICD-10-CM | POA: Diagnosis not present

## 2022-09-07 DIAGNOSIS — R29898 Other symptoms and signs involving the musculoskeletal system: Secondary | ICD-10-CM | POA: Insufficient documentation
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
## 2022-09-07 NOTE — Telephone Encounter (Signed)
Pt states just pain when lifting leg. Pt states no pain when walking. Pt believes she has a muscle strain. Pt has been trying to do squats and that has helped. Pt states if it is not better with in a week, she will call to schedule appt.
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
## 2022-09-07 NOTE — Telephone Encounter (Signed)
Nurses-please reach out to the patient regarding the following message.  See how she is doing regarding her foot and regarding her hip.  Also see if she is interested in wanting to be seen or evaluated (the timing of her evaluation partly depends upon the severity/level of her symptoms) let me know thank you  Hi Dr. Nicki Reaper, Yvone Neu here PT from [facility] in [place]. Wendy Marshall is my patient and she told me that she stub her toe around 2 months ago. Since then, her L hip is hurting and is getting worse. Patient has a hx of L hip replacement. Will you please check on the patient? Thank you and have a good day!

## 2022-09-08 ENCOUNTER — Ambulatory Visit: Payer: Medicare HMO | Admitting: Orthopedic Surgery

## 2022-09-08 ENCOUNTER — Encounter: Payer: Self-pay | Admitting: Orthopedic Surgery

## 2022-09-08 DIAGNOSIS — M4802 Spinal stenosis, cervical region: Secondary | ICD-10-CM | POA: Diagnosis not present

## 2022-09-08 DIAGNOSIS — Z981 Arthrodesis status: Secondary | ICD-10-CM

## 2022-09-08 DIAGNOSIS — M4722 Other spondylosis with radiculopathy, cervical region: Secondary | ICD-10-CM | POA: Diagnosis not present

## 2022-09-08 DIAGNOSIS — M5412 Radiculopathy, cervical region: Secondary | ICD-10-CM

## 2022-09-08 DIAGNOSIS — M47812 Spondylosis without myelopathy or radiculopathy, cervical region: Secondary | ICD-10-CM

## 2022-09-13 ENCOUNTER — Ambulatory Visit (HOSPITAL_COMMUNITY): Payer: Medicare HMO | Admitting: Physical Therapy

## 2022-09-13 ENCOUNTER — Encounter (HOSPITAL_COMMUNITY): Payer: Self-pay | Admitting: Physical Therapy

## 2022-09-13 DIAGNOSIS — M542 Cervicalgia: Secondary | ICD-10-CM | POA: Diagnosis not present

## 2022-09-13 DIAGNOSIS — R29898 Other symptoms and signs involving the musculoskeletal system: Secondary | ICD-10-CM | POA: Diagnosis not present

## 2022-09-13 DIAGNOSIS — M6281 Muscle weakness (generalized): Secondary | ICD-10-CM | POA: Diagnosis not present
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
## 2022-09-13 NOTE — Patient Instructions (Signed)

## 2022-09-19 DIAGNOSIS — G4733 Obstructive sleep apnea (adult) (pediatric): Secondary | ICD-10-CM | POA: Diagnosis not present

## 2022-09-20 ENCOUNTER — Encounter (HOSPITAL_COMMUNITY): Payer: Medicare HMO
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
## 2022-09-22 ENCOUNTER — Ambulatory Visit: Payer: Medicare HMO | Admitting: Neurosurgery

## 2022-09-22 ENCOUNTER — Encounter: Payer: Self-pay | Admitting: Neurosurgery

## 2022-09-22 VITALS — BP 158/83 | HR 93 | Ht 63.0 in | Wt 225.0 lb

## 2022-09-22 DIAGNOSIS — M4802 Spinal stenosis, cervical region: Secondary | ICD-10-CM

## 2022-09-22 DIAGNOSIS — M5412 Radiculopathy, cervical region: Secondary | ICD-10-CM

## 2022-09-22 DIAGNOSIS — Z981 Arthrodesis status: Secondary | ICD-10-CM

## 2022-09-22 NOTE — Patient Instructions (Addendum)
Please see below for information in regards to your upcoming surgery:  **we are planning to move our office location mid-March. Please check with [place] to see if we have moved to our new location prior to coming to your post op appointments after surgery. Our phone number will remain the same [phone]). Our new address will be: [facility] [address] [place], [place] [address]  Planned surgery: C4-5 and C6-7 anterior cervical discectomy and fusion   Surgery date: 10/10/22 - you will find out your arrival time the business day before your surgery.   Pre-op appointment at [place]: we will call you with a date/time for this. Pre-admit testing is located on the first floor of the [facility], [place], [address]. Please bring all prescriptions in the original prescription bottles to your appointment, even if you have reviewed medications by phone with a pharmacy representative. Pre-op labs may be done at your pre-op appointment. You are not required to fast for these labs. Should you need to change your pre-op appointment, please call Pre-admit testing at [phone].    Surgical clearance: we will send a clearance request to Dr Wolfgang Phoenix and to [facility]     Blood thinners:       Eliquis:   stop Eliquis 72 hours prior, resume Eliquis 14 days after Lovenox injections for 14 days after (prescription will be sent to pharmacy when discharged from hospital)    NSAIDS (Non-steroidal anti-inflammatory drugs): because you are having a fusion, no NSAIDS (such as ibuprofen, aleve, naproxen, meloxicam, diclofenac) for 3 months after surgery. Celebrex is an exception. Tylenol is ok because it is not an NSAID.    Because you are having a fusion: for appointments after your 2 week follow-up: please arrive at the [facility] outpatient imaging center ([place], [place]) or [facility] one hour prior to your appointment for x-rays. This applies to every appointment after your 2 week follow-up. Failure to do so may result in your appointment being rescheduled.   If you have FMLA/disability paperwork, please drop it off or fax it to [phone], attention [place].   We can be reached by phone or mychart 8am-4pm, Monday-Friday. If you have any questions/concerns before or after surgery, you can reach [place] at [phone], or you can send a mychart message. If you have a concern after hours that cannot wait until normal business hours, you can call [phone] and ask to page the neurosurgeon on call for [place].    Appointments/FMLA & disability paperwork: [place]  Nurse: Ophelia Shoulder  Medical assistants: Lum Keas Physician Assistant's: [place] Surgeon: Meade Maw, MD

## 2022-09-23 ENCOUNTER — Other Ambulatory Visit: Payer: Self-pay

## 2022-09-23 DIAGNOSIS — Z01818 Encounter for other preprocedural examination: Secondary | ICD-10-CM

## 2022-09-23 DIAGNOSIS — R490 Dysphonia: Secondary | ICD-10-CM | POA: Diagnosis not present

## 2022-09-23 DIAGNOSIS — M542 Cervicalgia: Secondary | ICD-10-CM | POA: Diagnosis not present

## 2022-09-26 ENCOUNTER — Encounter (HOSPITAL_COMMUNITY): Payer: Medicare HMO | Admitting: Physical Therapy

## 2022-09-29 ENCOUNTER — Ambulatory Visit (INDEPENDENT_AMBULATORY_CARE_PROVIDER_SITE_OTHER): Payer: Medicare HMO | Admitting: Family Medicine

## 2022-09-29 ENCOUNTER — Encounter
Admission: RE | Admit: 2022-09-29 | Discharge: 2022-09-29 | Disposition: A | Discharge status: Home / Self Care | Payer: Medicare HMO | Source: Ambulatory Visit | Attending: Neurosurgery | Admitting: Neurosurgery

## 2022-09-29 VITALS — BP 128/86 | HR 85 | Wt 227.6 lb

## 2022-09-29 VITALS — BP 114/56 | HR 93 | Resp 16 | Ht 63.0 in | Wt 226.4 lb

## 2022-09-29 DIAGNOSIS — Z7901 Long term (current) use of anticoagulants: Secondary | ICD-10-CM

## 2022-09-29 DIAGNOSIS — D6869 Other thrombophilia: Secondary | ICD-10-CM

## 2022-09-29 DIAGNOSIS — E039 Hypothyroidism, unspecified: Secondary | ICD-10-CM

## 2022-09-29 DIAGNOSIS — J029 Acute pharyngitis, unspecified: Secondary | ICD-10-CM | POA: Diagnosis not present

## 2022-09-29 DIAGNOSIS — I2699 Other pulmonary embolism without acute cor pulmonale: Secondary | ICD-10-CM

## 2022-09-29 DIAGNOSIS — Z01818 Encounter for other preprocedural examination: Secondary | ICD-10-CM | POA: Diagnosis not present

## 2022-09-29 DIAGNOSIS — M5412 Radiculopathy, cervical region: Secondary | ICD-10-CM | POA: Diagnosis not present

## 2022-09-29 DIAGNOSIS — Z01810 Encounter for preprocedural cardiovascular examination: Secondary | ICD-10-CM

## 2022-09-29 DIAGNOSIS — E785 Hyperlipidemia, unspecified: Secondary | ICD-10-CM

## 2022-09-29 DIAGNOSIS — J020 Streptococcal pharyngitis: Secondary | ICD-10-CM

## 2022-09-29 HISTORY — DX: Anemia, unspecified: D64.9

## 2022-09-29 HISTORY — DX: Long term (current) use of anticoagulants: Z79.01

## 2022-09-29 HISTORY — DX: Hyperlipidemia, unspecified: E78.5

## 2022-09-29 HISTORY — DX: Personal history of other diseases of the digestive system: Z87.19

## 2022-09-29 HISTORY — DX: Obstructive sleep apnea (adult) (pediatric): G47.33

## 2022-09-29 LAB — SURGICAL PCR SCREEN
MRSA, PCR: NEGATIVE
Staphylococcus aureus: NEGATIVE

## 2022-09-29 LAB — TYPE AND SCREEN
ABO/RH(D): A NEG
Antibody Screen: NEGATIVE

## 2022-09-29 LAB — POCT RAPID STREP A (OFFICE): Rapid Strep A Screen: POSITIVE — AB

## 2022-09-29 MED ORDER — AZITHROMYCIN 250 MG PO TABS: ORAL_TABLET | ORAL | 0 refills | Status: AC

## 2022-09-29 NOTE — Progress Notes (Signed)
   Subjective:    Patient ID: Wendy Marshall, female    DOB: [birth year], 67 y.o.   MRN: [id number]  HPI Patient arrives today for surgical clearance.  Patient is going to have cervical surgery coming up She will have to do a overnight stay She has a lot of severe pain in the back of her neck into the shoulder blades worse on the right side than the left side plus symptoms that go down both arms She has a history of hypothyroidism, cervical radiculopathy, sleep apnea, She also has a history of repetitive blood clots and is on Eliquis She denies any setbacks currently. Before she had the cervical issue over the past 8 months she was able to walk go up and down steps not have any type of chest tightness pressure pain or shortness of breath. Patient states she has really bad sore throat this morning. This been present over the past few hours denies high fever chills body aches headaches   Review of Systems     Objective:   Physical Exam  Neck no masses throat erythematous Lungs are clear respiratory rate normal heart regular pulse normal extremities no edema     Assessment & Plan:  1. Hypothyroidism, unspecified type Continue current thyroid medicine previous labs look good.  No need to do lab till summertime.  2. Hyperlipidemia, unspecified hyperlipidemia type Continue medication.  Tolerates well.  Eat healthy.  3. Cervical radiculopathy Surgery will help this dramatically. From a surgical standpoint-cardiovascularly she should be able to tolerate surgery well.  From the medical standpoint she is stable.  We will notify hematology to make sure they are aware.  Patient states that hematology worked with neurosurgery to tell her to stay off of Eliquis for 3 days before surgery and be off Eliquis for 14 days afterwards and that they will be sending her home on Lovenox.  4. Sore throat Strep throat treated with antibiotics azithromycin - POCT rapid strep A  5. Hypercoagulable  state, secondary ([place]) Stable followed by hematology  6. Morbid obesity ([place]) Portion control regular physical activity  7. Strep throat Azithromycin  Assessment form will be forwarded to Dr. Cari Caraway neurosurgeon [place]  I did communicate with her hematology group.  They agreed upon the no Eliquis 3 days prior to surgery and no Eliquis for 2 weeks after surgery and to utilize Lovenox daily during that span after surgery.  Any detailed questions regarding this by neurosurgery or the surgical team should be directed to hematology Dr. Tomie China group at [facility].

## 2022-09-29 NOTE — Patient Instructions (Signed)
Your procedure is scheduled on:10-10-22 Monday Report to the Registration Desk on the 1st floor of the [facility]. Then proceed to the 2nd floor Surgery Desk To find out your arrival time, please call [phone] between 1PM - 3PM on:10-07-22 Friday If your arrival time is 6:00 am, do not arrive before that time as the [facility] entrance doors do not open until 6:00 am.  REMEMBER: Instructions that are not followed completely may result in serious medical risk, up to and including death; or upon the discretion of your surgeon and anesthesiologist your surgery may need to be rescheduled.  Do not eat food after midnight the night before surgery.  No gum chewing or hard candies.  You may however, drink CLEAR liquids up to 2 hours before you are scheduled to arrive for your surgery. Do not drink anything within 2 hours of your scheduled arrival time.  Clear liquids include: - water  - apple juice without pulp - gatorade (not RED colors) - black coffee or tea (Do NOT add milk or creamers to the coffee or tea) Do NOT drink anything that is not on this list.  One week prior to surgery: Stop Anti-inflammatories (NSAIDS) such as Advil, Aleve, Ibuprofen, Motrin, Naproxen, Naprosyn and Aspirin based products such as Excedrin, Goody's Powder, BC Powder.You may however, continue to take Tylenol if needed for pain up until the day of surgery.  Stop ANY OVER THE COUNTER supplements/vitamins 7 days prior to surgery (CoQ10, Multivitamin and Turmeric)  Stop your ELIQUIS 3 days prior to surgery-Last dose will be on 10-06-22 Thursday  TAKE ONLY THESE MEDICATIONS THE MORNING OF SURGERY WITH A SIP OF WATER: -levothyroxine (SYNTHROID)   No Alcohol for 24 hours before or after surgery.  No Smoking including e-cigarettes for 24 hours before surgery.  No chewable tobacco products for at least 6 hours before surgery.  No nicotine patches on the day of surgery.  Do not use any "recreational" drugs for at  least a week (preferably 2 weeks) before your surgery.  Please be advised that the combination of cocaine and anesthesia may have negative outcomes, up to and including death. If you test positive for cocaine, your surgery will be cancelled.  On the morning of surgery brush your teeth with toothpaste and water, you may rinse your mouth with mouthwash if you wish. Do not swallow any toothpaste or mouthwash.  Use CHG Soap as directed on instruction sheet.  Do not wear jewelry, make-up, hairpins, clips or nail polish.  Do not wear lotions, powders, or perfumes.   Do not shave body hair from the neck down 48 hours before surgery.  Contact lenses, hearing aids and dentures may not be worn into surgery.  Do not bring valuables to the hospital. [facility] is not responsible for any missing/lost belongings or valuables.   Bring your C-PAP to the hospital   Notify your doctor if there is any change in your medical condition (cold, fever, infection).  Wear comfortable clothing (specific to your surgery type) to the hospital.  After surgery, you can help prevent lung complications by doing breathing exercises.  Take deep breaths and cough every 1-2 hours. Your doctor may order a device called an Incentive Spirometer to help you take deep breaths. When coughing or sneezing, hold a pillow firmly against your incision with both hands. This is called "splinting." Doing this helps protect your incision. It also decreases belly discomfort.  If you are being admitted to the hospital overnight, leave your  suitcase in the car. After surgery it may be brought to your room.  In case of increased patient census, it may be necessary for you, the patient, to continue your postoperative care in the Same Day Surgery department.  If you are being discharged the day of surgery, you will not be allowed to drive home. You will need a responsible individual to drive you home and stay with you for 24 hours after  surgery.   If you are taking public transportation, you will need to have a responsible individual with you.  Please call the [place] Dept. at [phone] if you have any questions about these instructions.  Surgery Visitation Policy:  Patients undergoing a surgery or procedure may have two family members or support persons with them as long as the person is not COVID-19 positive or experiencing its symptoms.   Inpatient Visitation:    Visiting hours are 7 a.m. to 8 p.m. Up to four visitors are allowed at one time in a patient room. The visitors may rotate out with other people during the day. One designated support person (adult) may remain overnight.  Due to an increase in RSV and influenza rates and associated hospitalizations, children ages 28 and under will not be able to visit patients in [facility]. Masks continue to be strongly recommended.     Preparing for Surgery with CHLORHEXIDINE GLUCONATE (CHG) Soap  Chlorhexidine Gluconate (CHG) Soap  o An antiseptic cleaner that kills germs and bonds with the skin to continue killing germs even after washing  o Used for showering the night before surgery and morning of surgery  Before surgery, you can play an important role by reducing the number of germs on your skin.  CHG (Chlorhexidine gluconate) soap is an antiseptic cleanser which kills germs and bonds with the skin to continue killing germs even after washing.  Please do not use if you have an allergy to CHG or antibacterial soaps. If your skin becomes reddened/irritated stop using the CHG.  1. Shower the NIGHT BEFORE SURGERY and the MORNING OF SURGERY with CHG soap.  2. If you choose to wash your hair, wash your hair first as usual with your normal shampoo.  3. After shampooing, rinse your hair and body thoroughly to remove the shampoo.  4. Use CHG as you would any other liquid soap. You can apply CHG directly to the skin and wash gently with  a scrungie or a clean washcloth.  5. Apply the CHG soap to your body only from the neck down. Do not use on open wounds or open sores. Avoid contact with your eyes, ears, mouth, and genitals (private parts). Wash face and genitals (private parts) with your normal soap.  6. Wash thoroughly, paying special attention to the area where your surgery will be performed.  7. Thoroughly rinse your body with warm water.  8. Do not shower/wash with your normal soap after using and rinsing off the CHG soap.  9. Pat yourself dry with a clean towel.  10. Wear clean pajamas to bed the night before surgery.  12. Place clean sheets on your bed the night of your first shower and do not sleep with pets.  13. Shower again with the CHG soap on the day of surgery prior to arriving at the hospital.  14. Do not apply any deodorants/lotions/powders.  15. Please wear clean clothes to the hospital.

## 2022-09-29 NOTE — Patient Instructions (Addendum)
Most recent Dr. Lovette Cliche and shingles prevention: know the facts!   Shingrix is a very effective vaccine to prevent shingles.   Shingles is a reactivation of chickenpox -more than 99% of Americans born before 1980 have had chickenpox even if they do not remember it. One in every 10 people who get shingles have severe long-lasting nerve pain as a result.   33 out of a 100 older adults will get shingles if they are unvaccinated.     This vaccine is very important for your health This vaccine is indicated for anyone 50 years or older. You can get this vaccine even if you have already had shingles because you can get the disease more than once in a lifetime.  Your risk for shingles and its complications increases with age.  This vaccine has 2 doses.  The second dose would be 2 to 6 months after the first dose.  If you had Zostavax vaccine in the past you should still get Shingrix. ( Zostavax is only 70% effective and it loses significant strength over a few years .)  This vaccine is given through the pharmacy.  The cost of the vaccine is through your insurance. The pharmacy can inform you of the total costs.  Common side effects including soreness in the arm, some redness and swelling, also some feel fatigue muscle soreness headache low-grade fever.  Side effects typically go away within 2 to 3 days. Remember-the pain from shingles can last a lifetime but these side effects of the vaccine will only last a few days at most. It is very important to get both doses in order to protect yourself fully.   Please get this vaccine at your earliest convenience at your trusted pharmacy.

## 2022-10-06 ENCOUNTER — Encounter: Payer: Self-pay | Admitting: Neurosurgery

## 2022-10-10 ENCOUNTER — Encounter
Admission: RE | Disposition: A | Discharge status: Home / Self Care | Payer: Self-pay | Source: Ambulatory Visit | Attending: Neurosurgery

## 2022-10-10 ENCOUNTER — Observation Stay
Admission: RE | Admit: 2022-10-10 | Discharge: 2022-10-11 | Disposition: A | Discharge status: Home / Self Care | Payer: Medicare HMO | Source: Ambulatory Visit | Attending: Neurosurgery | Admitting: Neurosurgery

## 2022-10-10 ENCOUNTER — Other Ambulatory Visit: Payer: Self-pay

## 2022-10-10 ENCOUNTER — Encounter: Payer: Self-pay | Admitting: Neurosurgery

## 2022-10-10 ENCOUNTER — Ambulatory Visit: Payer: Medicare HMO

## 2022-10-10 ENCOUNTER — Ambulatory Visit: Payer: Medicare HMO | Admitting: Urgent Care

## 2022-10-10 ENCOUNTER — Ambulatory Visit: Payer: Medicare HMO | Admitting: Registered Nurse

## 2022-10-10 DIAGNOSIS — Z981 Arthrodesis status: Secondary | ICD-10-CM | POA: Diagnosis not present

## 2022-10-10 DIAGNOSIS — E785 Hyperlipidemia, unspecified: Secondary | ICD-10-CM | POA: Diagnosis not present

## 2022-10-10 DIAGNOSIS — G4733 Obstructive sleep apnea (adult) (pediatric): Secondary | ICD-10-CM | POA: Insufficient documentation

## 2022-10-10 DIAGNOSIS — M4802 Spinal stenosis, cervical region: Secondary | ICD-10-CM | POA: Insufficient documentation

## 2022-10-10 DIAGNOSIS — E039 Hypothyroidism, unspecified: Secondary | ICD-10-CM | POA: Insufficient documentation

## 2022-10-10 DIAGNOSIS — Z86718 Personal history of other venous thrombosis and embolism: Secondary | ICD-10-CM | POA: Insufficient documentation

## 2022-10-10 DIAGNOSIS — M4722 Other spondylosis with radiculopathy, cervical region: Secondary | ICD-10-CM | POA: Diagnosis not present

## 2022-10-10 DIAGNOSIS — Z87891 Personal history of nicotine dependence: Secondary | ICD-10-CM | POA: Insufficient documentation

## 2022-10-10 DIAGNOSIS — M5412 Radiculopathy, cervical region: Secondary | ICD-10-CM | POA: Diagnosis not present

## 2022-10-10 DIAGNOSIS — Z96643 Presence of artificial hip joint, bilateral: Secondary | ICD-10-CM | POA: Diagnosis not present

## 2022-10-10 DIAGNOSIS — Z01818 Encounter for other preprocedural examination: Secondary | ICD-10-CM

## 2022-10-10 HISTORY — PX: ANTERIOR CERVICAL DECOMP/DISCECTOMY FUSION: SHX1161

## 2022-10-10 SURGERY — ANTERIOR CERVICAL DECOMPRESSION/DISCECTOMY FUSION 2 LEVELS
Anesthesia: General | Site: Spine Cervical

## 2022-10-10 MED ORDER — LACTATED RINGERS IV SOLN: INTRAVENOUS | Status: DC

## 2022-10-10 MED ORDER — REMIFENTANIL HCL 1 MG IV SOLR
INTRAVENOUS | Status: DC | PRN
  Administered 2022-10-10: .1 ug/kg/min via INTRAVENOUS

## 2022-10-10 MED ORDER — DOCUSATE SODIUM 100 MG PO CAPS
100.0000 mg | ORAL_CAPSULE | Freq: Two times a day (BID) | ORAL | Status: DC
  Administered 2022-10-10 – 2022-10-11 (×2): 100 mg via ORAL
  Filled 2022-10-10 (×2): qty 1

## 2022-10-10 MED ORDER — ONDANSETRON HCL 4 MG/2ML IJ SOLN
INTRAMUSCULAR | Status: AC
  Filled 2022-10-10: qty 2

## 2022-10-10 MED ORDER — CEFAZOLIN SODIUM-DEXTROSE 2-4 GM/100ML-% IV SOLN
INTRAVENOUS | Status: AC
  Filled 2022-10-10: qty 100

## 2022-10-10 MED ORDER — MIDAZOLAM HCL 2 MG/2ML IJ SOLN
INTRAMUSCULAR | Status: DC | PRN
  Administered 2022-10-10: 2 mg via INTRAVENOUS

## 2022-10-10 MED ORDER — BUPIVACAINE HCL (PF) 0.5 % IJ SOLN
INTRAMUSCULAR | Status: AC
  Filled 2022-10-10: qty 30

## 2022-10-10 MED ORDER — FENTANYL CITRATE (PF) 100 MCG/2ML IJ SOLN
INTRAMUSCULAR | Status: DC | PRN
  Administered 2022-10-10: 25 ug via INTRAVENOUS
  Administered 2022-10-10: 50 ug via INTRAVENOUS
  Administered 2022-10-10: 25 ug via INTRAVENOUS

## 2022-10-10 MED ORDER — SCOPOLAMINE 1 MG/3DAYS TD PT72: 1.0000 | MEDICATED_PATCH | TRANSDERMAL | Status: DC

## 2022-10-10 MED ORDER — OXYCODONE HCL 5 MG PO TABS
10.0000 mg | ORAL_TABLET | ORAL | Status: DC | PRN
  Administered 2022-10-10 – 2022-10-11 (×2): 10 mg via ORAL
  Filled 2022-10-10 (×4): qty 2

## 2022-10-10 MED ORDER — FENTANYL CITRATE (PF) 100 MCG/2ML IJ SOLN
INTRAMUSCULAR | Status: AC
  Administered 2022-10-10: 50 ug via INTRAVENOUS
  Filled 2022-10-10: qty 2

## 2022-10-10 MED ORDER — ROSUVASTATIN CALCIUM 10 MG PO TABS
10.0000 mg | ORAL_TABLET | Freq: Every day | ORAL | Status: DC
  Administered 2022-10-10: 10 mg via ORAL
  Filled 2022-10-10 (×2): qty 1

## 2022-10-10 MED ORDER — FLEET ENEMA 7-19 GM/118ML RE ENEM: 1.0000 | ENEMA | Freq: Once | RECTAL | Status: DC | PRN

## 2022-10-10 MED ORDER — SENNA 8.6 MG PO TABS
1.0000 | ORAL_TABLET | Freq: Two times a day (BID) | ORAL | Status: DC
  Administered 2022-10-10 – 2022-10-11 (×2): 8.6 mg via ORAL
  Filled 2022-10-10 (×3): qty 1

## 2022-10-10 MED ORDER — KETAMINE HCL 10 MG/ML IJ SOLN
INTRAMUSCULAR | Status: DC | PRN
  Administered 2022-10-10: 30 mg via INTRAVENOUS

## 2022-10-10 MED ORDER — SODIUM CHLORIDE 0.9% FLUSH
3.0000 mL | Freq: Two times a day (BID) | INTRAVENOUS | Status: DC
  Administered 2022-10-10 – 2022-10-11 (×2): 3 mL via INTRAVENOUS

## 2022-10-10 MED ORDER — REMIFENTANIL HCL 1 MG IV SOLR
INTRAVENOUS | Status: AC
  Filled 2022-10-10: qty 1000

## 2022-10-10 MED ORDER — ONDANSETRON HCL 4 MG/2ML IJ SOLN: 4.0000 mg | Freq: Once | INTRAMUSCULAR | Status: DC | PRN

## 2022-10-10 MED ORDER — ENOXAPARIN SODIUM 40 MG/0.4ML IJ SOSY
40.0000 mg | PREFILLED_SYRINGE | INTRAMUSCULAR | Status: DC
  Administered 2022-10-11: 40 mg via SUBCUTANEOUS
  Filled 2022-10-10 (×2): qty 0.4

## 2022-10-10 MED ORDER — MIDAZOLAM HCL 2 MG/2ML IJ SOLN
INTRAMUSCULAR | Status: AC
  Filled 2022-10-10: qty 2

## 2022-10-10 MED ORDER — PROPOFOL 10 MG/ML IV BOLUS
INTRAVENOUS | Status: DC | PRN
  Administered 2022-10-10: 175 ug/kg/min via INTRAVENOUS
  Administered 2022-10-10: 200 mg via INTRAVENOUS

## 2022-10-10 MED ORDER — CHLORHEXIDINE GLUCONATE 0.12 % MT SOLN
OROMUCOSAL | Status: AC
  Administered 2022-10-10: 15 mL via OROMUCOSAL
  Filled 2022-10-10: qty 15

## 2022-10-10 MED ORDER — FENTANYL CITRATE (PF) 100 MCG/2ML IJ SOLN
INTRAMUSCULAR | Status: AC
  Filled 2022-10-10: qty 2

## 2022-10-10 MED ORDER — ACETAMINOPHEN 10 MG/ML IV SOLN: 1000.0000 mg | Freq: Once | INTRAVENOUS | Status: DC | PRN

## 2022-10-10 MED ORDER — ONDANSETRON HCL 4 MG PO TABS: 4.0000 mg | ORAL_TABLET | Freq: Four times a day (QID) | ORAL | Status: DC | PRN

## 2022-10-10 MED ORDER — BUPIVACAINE-EPINEPHRINE (PF) 0.5% -1:200000 IJ SOLN
INTRAMUSCULAR | Status: DC | PRN
  Administered 2022-10-10: 5 mL via PERINEURAL

## 2022-10-10 MED ORDER — PROPOFOL 1000 MG/100ML IV EMUL
INTRAVENOUS | Status: AC
  Filled 2022-10-10: qty 100

## 2022-10-10 MED ORDER — ONDANSETRON HCL 4 MG/2ML IJ SOLN
INTRAMUSCULAR | Status: DC | PRN
  Administered 2022-10-10: 4 mg via INTRAVENOUS

## 2022-10-10 MED ORDER — SODIUM CHLORIDE 0.9 % IV SOLN: INTRAVENOUS | Status: DC

## 2022-10-10 MED ORDER — KETAMINE HCL 50 MG/5ML IJ SOSY
PREFILLED_SYRINGE | INTRAMUSCULAR | Status: AC
  Filled 2022-10-10: qty 5

## 2022-10-10 MED ORDER — EPHEDRINE 5 MG/ML INJ
INTRAVENOUS | Status: AC
  Filled 2022-10-10: qty 5

## 2022-10-10 MED ORDER — BISACODYL 10 MG RE SUPP: 10.0000 mg | Freq: Every day | RECTAL | Status: DC | PRN

## 2022-10-10 MED ORDER — DEXAMETHASONE SODIUM PHOSPHATE 10 MG/ML IJ SOLN
INTRAMUSCULAR | Status: DC | PRN
  Administered 2022-10-10: 10 mg via INTRAVENOUS

## 2022-10-10 MED ORDER — OXYCODONE HCL 5 MG/5ML PO SOLN: 5.0000 mg | Freq: Once | ORAL | Status: AC | PRN

## 2022-10-10 MED ORDER — SUCCINYLCHOLINE CHLORIDE 200 MG/10ML IV SOSY
PREFILLED_SYRINGE | INTRAVENOUS | Status: DC | PRN
  Administered 2022-10-10: 100 mg via INTRAVENOUS

## 2022-10-10 MED ORDER — MENTHOL 3 MG MT LOZG: 1.0000 | LOZENGE | OROMUCOSAL | Status: DC | PRN

## 2022-10-10 MED ORDER — LIDOCAINE HCL (CARDIAC) PF 100 MG/5ML IV SOSY
PREFILLED_SYRINGE | INTRAVENOUS | Status: DC | PRN
  Administered 2022-10-10: 100 mg via INTRAVENOUS

## 2022-10-10 MED ORDER — FAMOTIDINE 20 MG PO TABS: 20.0000 mg | ORAL_TABLET | Freq: Once | ORAL | Status: AC

## 2022-10-10 MED ORDER — PHENOL 1.4 % MT LIQD: 1.0000 | OROMUCOSAL | Status: DC | PRN

## 2022-10-10 MED ORDER — OXYCODONE HCL 5 MG PO TABS
ORAL_TABLET | ORAL | Status: AC
  Filled 2022-10-10: qty 1

## 2022-10-10 MED ORDER — LEVOTHYROXINE SODIUM 112 MCG PO TABS
112.0000 ug | ORAL_TABLET | Freq: Every day | ORAL | Status: DC
  Administered 2022-10-11: 112 ug via ORAL
  Filled 2022-10-10: qty 1

## 2022-10-10 MED ORDER — SODIUM CHLORIDE 0.9 % IV SOLN: 250.0000 mL | INTRAVENOUS | Status: DC

## 2022-10-10 MED ORDER — PROPOFOL 1000 MG/100ML IV EMUL
INTRAVENOUS | Status: AC
  Filled 2022-10-10: qty 400

## 2022-10-10 MED ORDER — ONDANSETRON HCL 4 MG/2ML IJ SOLN: 4.0000 mg | Freq: Four times a day (QID) | INTRAMUSCULAR | Status: DC | PRN

## 2022-10-10 MED ORDER — EPINEPHRINE PF 1 MG/ML IJ SOLN
INTRAMUSCULAR | Status: AC
  Filled 2022-10-10: qty 1

## 2022-10-10 MED ORDER — PHENYLEPHRINE HCL-NACL 20-0.9 MG/250ML-% IV SOLN
INTRAVENOUS | Status: DC | PRN
  Administered 2022-10-10: 50 ug/min via INTRAVENOUS

## 2022-10-10 MED ORDER — SUCCINYLCHOLINE CHLORIDE 200 MG/10ML IV SOSY
PREFILLED_SYRINGE | INTRAVENOUS | Status: AC
  Filled 2022-10-10: qty 10

## 2022-10-10 MED ORDER — CEFAZOLIN SODIUM-DEXTROSE 2-4 GM/100ML-% IV SOLN
2.0000 g | INTRAVENOUS | Status: AC
  Administered 2022-10-10: 2 g via INTRAVENOUS

## 2022-10-10 MED ORDER — DEXAMETHASONE SODIUM PHOSPHATE 10 MG/ML IJ SOLN
INTRAMUSCULAR | Status: AC
  Filled 2022-10-10: qty 1

## 2022-10-10 MED ORDER — 0.9 % SODIUM CHLORIDE (POUR BTL) OPTIME
TOPICAL | Status: DC | PRN
  Administered 2022-10-10: 500 mL

## 2022-10-10 MED ORDER — EPHEDRINE SULFATE (PRESSORS) 50 MG/ML IJ SOLN
INTRAMUSCULAR | Status: DC | PRN
  Administered 2022-10-10: 5 mg via INTRAVENOUS
  Administered 2022-10-10: 15 mg via INTRAVENOUS

## 2022-10-10 MED ORDER — FAMOTIDINE 20 MG PO TABS
ORAL_TABLET | ORAL | Status: AC
  Administered 2022-10-10: 20 mg via ORAL
  Filled 2022-10-10: qty 1

## 2022-10-10 MED ORDER — ORAL CARE MOUTH RINSE: 15.0000 mL | Freq: Once | OROMUCOSAL | Status: AC

## 2022-10-10 MED ORDER — OXYCODONE HCL 5 MG PO TABS
5.0000 mg | ORAL_TABLET | ORAL | Status: DC | PRN
  Administered 2022-10-11: 5 mg via ORAL

## 2022-10-10 MED ORDER — CHLORHEXIDINE GLUCONATE 0.12 % MT SOLN: 15.0000 mL | Freq: Once | OROMUCOSAL | Status: AC

## 2022-10-10 MED ORDER — POLYETHYLENE GLYCOL 3350 17 G PO PACK: 17.0000 g | PACK | Freq: Every day | ORAL | Status: DC | PRN

## 2022-10-10 MED ORDER — SODIUM CHLORIDE 0.9% FLUSH: 3.0000 mL | INTRAVENOUS | Status: DC | PRN

## 2022-10-10 MED ORDER — MORPHINE SULFATE (PF) 2 MG/ML IV SOLN
1.0000 mg | INTRAVENOUS | Status: DC | PRN
  Administered 2022-10-10: 1 mg via INTRAVENOUS
  Filled 2022-10-10: qty 1

## 2022-10-10 MED ORDER — PHENYLEPHRINE HCL-NACL 20-0.9 MG/250ML-% IV SOLN
INTRAVENOUS | Status: AC
  Filled 2022-10-10: qty 250

## 2022-10-10 MED ORDER — LIDOCAINE HCL (PF) 2 % IJ SOLN
INTRAMUSCULAR | Status: AC
  Filled 2022-10-10: qty 5

## 2022-10-10 MED ORDER — OXYCODONE HCL 5 MG PO TABS
5.0000 mg | ORAL_TABLET | Freq: Once | ORAL | Status: AC | PRN
  Administered 2022-10-10: 5 mg via ORAL

## 2022-10-10 MED ORDER — SCOPOLAMINE 1 MG/3DAYS TD PT72
MEDICATED_PATCH | TRANSDERMAL | Status: AC
  Administered 2022-10-10: 1.5 mg via TRANSDERMAL
  Filled 2022-10-10: qty 1

## 2022-10-10 MED ORDER — CEFAZOLIN IN SODIUM CHLORIDE 2-0.9 GM/100ML-% IV SOLN
2.0000 g | Freq: Once | INTRAVENOUS | Status: DC
  Filled 2022-10-10: qty 100

## 2022-10-10 MED ORDER — FENTANYL CITRATE (PF) 100 MCG/2ML IJ SOLN
25.0000 ug | INTRAMUSCULAR | Status: DC | PRN
  Administered 2022-10-10: 50 ug via INTRAVENOUS

## 2022-10-10 MED ORDER — ACETAMINOPHEN 500 MG PO TABS
1000.0000 mg | ORAL_TABLET | Freq: Four times a day (QID) | ORAL | Status: AC
  Administered 2022-10-10 – 2022-10-11 (×4): 1000 mg via ORAL
  Filled 2022-10-10 (×4): qty 2

## 2022-10-10 MED ORDER — KETOROLAC TROMETHAMINE 15 MG/ML IJ SOLN
15.0000 mg | Freq: Four times a day (QID) | INTRAMUSCULAR | Status: DC
  Administered 2022-10-10 – 2022-10-11 (×2): 15 mg via INTRAVENOUS
  Filled 2022-10-10 (×2): qty 1

## 2022-10-10 MED ORDER — SURGIFLO WITH THROMBIN (HEMOSTATIC MATRIX KIT) OPTIME
TOPICAL | Status: DC | PRN
  Administered 2022-10-10: 1 via TOPICAL

## 2022-10-10 SURGICAL SUPPLY — 57 items
10 fr channel round drain and trocar IMPLANT
ADH SKN CLS APL DERMABOND .7 (GAUZE/BANDAGES/DRESSINGS) ×1
AGENT HMST KT MTR STRL THRMB (HEMOSTASIS) ×1
BASIN KIT SINGLE STR (MISCELLANEOUS) ×1 IMPLANT
BASKET BONE COLLECTION (BASKET) IMPLANT
BULB RESERV EVAC DRAIN JP 100C (MISCELLANEOUS) IMPLANT
BUR NEURO DRILL SOFT 3.0X3.8M (BURR) ×1 IMPLANT
DERMABOND ADVANCED .7 DNX12 (GAUZE/BANDAGES/DRESSINGS) ×1 IMPLANT
DRAIN CHANNEL JP 10F RND 20C F (MISCELLANEOUS) IMPLANT
DRAIN JP 10F RND SILICONE (MISCELLANEOUS) IMPLANT
DRAPE C ARM PK CFD 31 SPINE (DRAPES) ×1 IMPLANT
DRAPE LAPAROTOMY 77X122 PED (DRAPES) ×1 IMPLANT
DRAPE MICROSCOPE SPINE 48X150 (DRAPES) ×1 IMPLANT
DRSG OPSITE POSTOP 4X6 (GAUZE/BANDAGES/DRESSINGS) ×2 IMPLANT
ELECT REM PT RETURN 9FT ADLT (ELECTROSURGICAL) ×1
ELECTRODE REM PT RTRN 9FT ADLT (ELECTROSURGICAL) ×1 IMPLANT
FEE INTRAOP CADWELL SUPPLY NCS (MISCELLANEOUS) IMPLANT
FEE INTRAOP MONITOR IMPULS NCS (MISCELLANEOUS) IMPLANT
GLOVE SURG SYN 6.5 ES PF (GLOVE) ×2 IMPLANT
GLOVE SURG SYN 6.5 PF PI (GLOVE) ×2 IMPLANT
GLOVE SURG SYN 8.5  E (GLOVE) ×3
GLOVE SURG SYN 8.5 E (GLOVE) ×3 IMPLANT
GLOVE SURG SYN 8.5 PF PI (GLOVE) ×3 IMPLANT
GLOVE SURG UNDER POLY LF SZ6.5 (GLOVE) ×2 IMPLANT
GOWN SRG LRG LVL 4 IMPRV REINF (GOWNS) ×2 IMPLANT
GOWN SRG XL LVL 3 NONREINFORCE (GOWNS) ×1 IMPLANT
GOWN STRL NON-REIN TWL XL LVL3 (GOWNS) ×1
GOWN STRL REIN LRG LVL4 (GOWNS) ×2
INTRAOP CADWELL SUPPLY FEE NCS (MISCELLANEOUS)
INTRAOP DISP SUPPLY FEE NCS (MISCELLANEOUS)
INTRAOP MONITOR FEE IMPULS NCS (MISCELLANEOUS)
INTRAOP MONITOR FEE IMPULSE (MISCELLANEOUS)
KIT TURNOVER KIT A (KITS) ×1 IMPLANT
MANIFOLD NEPTUNE II (INSTRUMENTS) ×1 IMPLANT
NDL SAFETY ECLIP 18X1.5 (MISCELLANEOUS) ×1 IMPLANT
NS IRRIG 1000ML POUR BTL (IV SOLUTION) ×1 IMPLANT
NS IRRIG 500ML POUR BTL (IV SOLUTION) IMPLANT
PACK LAMINECTOMY NEURO (CUSTOM PROCEDURE TRAY) ×1 IMPLANT
PAD ARMBOARD 7.5X6 YLW CONV (MISCELLANEOUS) ×2 IMPLANT
PIN CASPAR 14 (PIN) ×1 IMPLANT
PIN CASPAR 14MM (PIN) ×1
PLATE ACP 1.6 18 (Plate) IMPLANT
PLATE ACP 1.9H 20 1-LEVEL (Plate) IMPLANT
SCREW ACP 3.5X17 S/D VARIA (Screw) IMPLANT
SPACER CERVICAL FRGE 12X14X7-7 (Spacer) IMPLANT
SPONGE KITTNER 5P (MISCELLANEOUS) ×1 IMPLANT
STAPLER SKIN PROX 35W (STAPLE) IMPLANT
SURGIFLO W/THROMBIN 8M KIT (HEMOSTASIS) ×1 IMPLANT
SUT DVC VLOC 3-0 CL 6 P-12 (SUTURE) IMPLANT
SUT ETHILON 3-0 FS-10 30 BLK (SUTURE) ×1
SUT VIC AB 3-0 SH 8-18 (SUTURE) ×1 IMPLANT
SUT VICRYL 3-0 CR8 SH (SUTURE) ×1 IMPLANT
SUTURE EHLN 3-0 FS-10 30 BLK (SUTURE) IMPLANT
SYR 20ML LL LF (SYRINGE) ×1 IMPLANT
TAPE CLOTH 3X10 WHT NS LF (GAUZE/BANDAGES/DRESSINGS) ×3 IMPLANT
TRAP FLUID SMOKE EVACUATOR (MISCELLANEOUS) ×1 IMPLANT
TRAY FOLEY MTR SLVR 16FR STAT (SET/KITS/TRAYS/PACK) IMPLANT

## 2022-10-10 NOTE — Plan of Care (Signed)
  Problem: Activity: Goal: Ability to avoid complications of mobility impairment will improve Outcome: Progressing   Problem: Pain Management: Goal: Pain level will decrease Outcome: Progressing   Problem: Skin Integrity: Goal: Will show signs of wound healing Outcome: Progressing   Problem: Bladder/Genitourinary: Goal: Urinary functional status for postoperative course will improve Outcome: Progressing   Problem: Pain Managment: Goal: General experience of comfort will improve Outcome: Progressing   Problem: Safety: Goal: Ability to remain free from injury will improve Outcome: Progressing   Problem: Skin Integrity: Goal: Risk for impaired skin integrity will decrease Outcome: Progressing

## 2022-10-10 NOTE — Anesthesia Procedure Notes (Signed)
Procedure Name: Intubation Date/Time: 10/10/2022 2:28 PM  Performed by: Lia Foyer, CRNAPre-anesthesia Checklist: Patient identified, Emergency Drugs available, Suction available and Patient being monitored Patient Re-evaluated:Patient Re-evaluated prior to induction Oxygen Delivery Method: Circle system utilized Preoxygenation: Pre-oxygenation with 100% oxygen Induction Type: IV induction Ventilation: Mask ventilation without difficulty Laryngoscope Size: McGraph and 3 Grade View: Grade II Tube type: Oral Tube size: 7.0 mm Number of attempts: 1 Airway Equipment and Method: Stylet, Oral airway and Video-laryngoscopy Placement Confirmation: ETT inserted through vocal cords under direct vision, positive ETCO2 and breath sounds checked- equal and bilateral Secured at: 22 cm Tube secured with: Tape Dental Injury: Teeth and Oropharynx as per pre-operative assessment  Difficulty Due To: Difficult Airway- due to immobile epiglottis

## 2022-10-10 NOTE — Plan of Care (Signed)

## 2022-10-10 NOTE — Transfer of Care (Signed)
Immediate Anesthesia Transfer of Care Note  Patient: Wendy Marshall  Procedure(s) Performed: C4-5 AND C6-7 ANTERIOR CERVICAL DISCECTOMY AND FUSION (Spine Cervical)  Patient Location: PACU  Anesthesia Type:General  Level of Consciousness: drowsy  Airway & Oxygen Therapy: Patient Spontanous Breathing and Patient connected to face mask oxygen  Post-op Assessment: Report given to RN  Post vital signs: stable  Last Vitals:  Vitals Value Taken Time  BP 155/93 10/10/22 1657  Temp    Pulse 109 10/10/22 1659  Resp 31 10/10/22 1659  SpO2 96 % 10/10/22 1659  Vitals shown include unvalidated device data.  Last Pain:  Vitals:   10/10/22 1100  TempSrc: Temporal  PainSc: 4          Complications: No notable events documented.

## 2022-10-10 NOTE — Anesthesia Postprocedure Evaluation (Signed)
Anesthesia Post Note  Patient: Wendy Marshall  Procedure(s) Performed: C4-5 AND C6-7 ANTERIOR CERVICAL DISCECTOMY AND FUSION (Spine Cervical)  Patient location during evaluation: PACU Anesthesia Type: General Level of consciousness: awake and alert Pain management: pain level controlled Vital Signs Assessment: post-procedure vital signs reviewed and stable Respiratory status: spontaneous breathing, nonlabored ventilation, respiratory function stable and patient connected to nasal cannula oxygen Cardiovascular status: blood pressure returned to baseline and stable Postop Assessment: no apparent nausea or vomiting Anesthetic complications: no   No notable events documented.   Last Vitals:  Vitals:   10/10/22 1717 10/10/22 1730  BP: (!) 150/91 (!) 145/69  Pulse: 96 98  Resp: 16 15  Temp:    SpO2: 96% 94%    Last Pain:  Vitals:   10/10/22 1717  TempSrc:   PainSc: 5                  Ilene Qua

## 2022-10-10 NOTE — Interval H&P Note (Signed)
History and Physical Interval Note:  10/10/2022 2:04 PM  Wendy Marshall  has presented today for surgery, with the diagnosis of M54.12 cervical radiculopathy.  The various methods of treatment have been discussed with the patient and family. After consideration of risks, benefits and other options for treatment, the patient has consented to  Procedure(s): C4-5 AND C6-7 ANTERIOR CERVICAL DISCECTOMY AND FUSION (N/A) as a surgical intervention.  The patient's history has been reviewed, patient examined, no change in status, stable for surgery.  I have reviewed the patient's chart and labs.  Questions were answered to the patient's satisfaction.    Heart sounds normal no MRG. Chest Clear to Auscultation Bilaterally.   Castin Donaghue

## 2022-10-10 NOTE — Anesthesia Preprocedure Evaluation (Addendum)
Anesthesia Evaluation  Patient identified by MRN, date of birth, ID band Patient awake    Reviewed: Allergy & Precautions, NPO status , Patient's Chart, lab work & pertinent test results  History of Anesthesia Complications (+) PONV and history of anesthetic complications  Airway Mallampati: IV   Neck ROM: Full    Dental   [place] :   Pulmonary sleep apnea , former smoker (quit 1997)   Pulmonary exam normal breath sounds clear to auscultation       Cardiovascular Exercise Tolerance: Good Normal cardiovascular exam Rhythm:Regular Rate:Normal  Hx bilateral PE 2017 on Eliquis  ECG 09/29/22:  Normal sinus rhythm Low voltage QRS Nonspecific ST and T wave abnormality Abnormal ECG When compared with ECG of 30-Aug-2017 10:46, No significant change was found   Neuro/Psych  Headaches    GI/Hepatic ,GERD  ,,  Endo/Other  Hypothyroidism  Class 3 obesity   Renal/GU negative Renal ROS     Musculoskeletal  (+) Arthritis ,  Fibromyalgia -  Abdominal   Peds  Hematology negative hematology ROS (+)   Anesthesia Other Findings Strep throat 09/29/22, on antibiotics since then, no further symptoms  Reproductive/Obstetrics                             Anesthesia Physical Anesthesia Plan  ASA: 3  Anesthesia Plan: General   Post-op Pain Management:    Induction: Intravenous  PONV Risk Score and Plan: 4 or greater and Ondansetron, Dexamethasone, Treatment may vary due to age or medical condition, Scopolamine patch - Pre-op and Propofol infusion  Airway Management Planned: Oral ETT  Additional Equipment:   Intra-op Plan:   Post-operative Plan: Extubation in OR  Informed Consent: I have reviewed the patients History and Physical, chart, labs and discussed the procedure including the risks, benefits and alternatives for the proposed anesthesia with the patient or authorized representative who has  indicated his/her understanding and acceptance.     Dental advisory given  Plan Discussed with: CRNA  Anesthesia Plan Comments: (Patient consented for risks of anesthesia including but not limited to:  - adverse reactions to medications - damage to eyes, teeth, lips or other oral mucosa - nerve damage due to positioning  - sore throat or hoarseness - damage to heart, brain, nerves, lungs, other parts of body or loss of life  Informed patient about role of CRNA in peri- and intra-operative care.  Patient voiced understanding.)        Anesthesia Quick Evaluation

## 2022-10-10 NOTE — Op Note (Signed)
Indications: Wendy Marshall is a 67 yo female who presented with cervical radiculopathy due to extensive cervical spondylosis.  The patient failed conservative management and elected for surgical intervention.  Findings: extensive spondylosis  Preoperative Diagnosis: Cervical radiculopathy Postoperative Diagnosis: same   EBL: 50 ml IVF: see AR ml Drains: 1 placed Disposition: Extubated and Stable to PACU Complications: none  No foley catheter was placed.   Preoperative Note:   Risks of surgery discussed include: infection, bleeding, stroke, coma, death, paralysis, CSF leak, nerve/spinal cord injury, numbness, tingling, weakness, complex regional pain syndrome, recurrent stenosis and/or disc herniation, vascular injury, development of instability, neck/back pain, need for further surgery, persistent symptoms, development of deformity, and the risks of anesthesia. The patient understood these risks and agreed to proceed.     Operative Procedure: 1. Anterior Cervical Discectomy and Fusion C4-5 including bilateral foraminotomies and end plate preparation  2. Removal of C5-6 plate  3. Anterior Cervical Discectomy and Fusion C6-7 including bilateral foraminotomies and end plate preparation 4. Anterior Spinal Instrumentation C4-5 and C6-7 (2 separate plates) 5. Anterior arthrodesis from C4-5 and C6-7 6. Use of the operative microscope 7. Use of intraoperative flouroscopy  PROCEDURE IN DETAIL: After obtaining informed consent, the patient taken to the operating room, placed in supine position, general anesthesia induced.  The patient had a small shoulder roll placed behind their shoulders.  The patient received preop antibiotics and IV Decadron.  The patient had a neck incision outlined, was prepped and draped in usual sterile fashion. The incision was injected with local anesthetic.   An incision was opened, dissection taken down medial to the carotid artery and jugular vein, lateral to  the trachea and esophagus.  The prevertebral fascia identified and a localizing x-ray demonstrated the correct level.  The longus colli were dissected laterally, and self-retaining retractors placed to open the operative field. The microscope was then brought into the field.  With this complete, distractor pins were placed in the vertebral bodies of C4 and C5.  The distractor was placed from C4 to C5. The annulus at C4-5 were opened with a bovie. Curettes and pituitary rongeurs used to remove the majority of disk at each level, then the drill was used to remove the posterior osteophyte and begin the foraminotomies. The nerve hook was used to elevate the posterior longitudinal ligament, which was then removed with Kerrison rongeurs. The microblunt nerve hook could be passed out the foramen bilaterally.   Meticulous hemostasis was obtained. After hemostasis, structural allograft was tapped behind the anterior lip of the vertebral body at C4-5 (7 mm height).    A separate, two segment, one level plate (18 mm Nuvasive ACP) was chosen.  Two screws placed in the vertebral bodies of C4 and C5, respectively making sure the screws were behind the locking mechanism.    The distractor was then removed, and the caspar pins were moved to C6 and C7. Bone wax was used for hemostasis. The distractor was placed from C6-7, and the annulus at C6-7 was opened using a bovie.  Curettes and pituitary rongeurs used to remove the majority of disk, then the drill was used to remove the posterior osteophyte and begin the foraminotomies. The nerve hook was used to elevate the posterior longitudinal ligament, which was then removed with Kerrison rongeurs. The microblunt nerve hook could be passed out the foramen bilaterally.   Meticulous hemostasis was obtained.  Structural allograft was tapped behind the anterior lip of the vertebral body at C6-7 (7 mm height).  The caspar distractor was removed, and bone wax used for hemostasis at  each level. The anterior osteophytes were removed.    A separate, two segment, one level plate (18 mm Nuvasive ACP) was chosen.  Two screws placed in the vertebral bodies, respectively making sure the screws were behind the locking mechanism.  Final AP and lateral radiographs were taken.   Please note that 2 plates were utilized, one at C4-5 and one at C6-7. Also note that the plate is not inclusive to the interbody structural allograft.  The anchoring mechanism of the plate is completely separate from the allograft.  With everything in good position, the wound was irrigated copiously with bacitracin-containing solution and meticulous hemostasis obtained.  A drain was placed. Wound was closed in 2 layers using interrupted inverted 3-0 Vicryl sutures in the platysma and 3-0 monocryl in the dermis.  The wound was dressed with dermabond, the head of bed at 30 degrees, taken to recovery room in stable condition.  No new postop neurological deficits were identified.  All counts were correct at the end of the case.   I performed the entire procedure with Cooper Render PA as an Environmental consultant. An assistant was required for this procedure due to the complexity.  The assistant provided assistance in tissue manipulation and suction, and was required for the successful and safe performance of the procedure. I performed the critical portions of the procedure.   Meade Maw MD

## 2022-10-11 DIAGNOSIS — E039 Hypothyroidism, unspecified: Secondary | ICD-10-CM | POA: Diagnosis not present

## 2022-10-11 DIAGNOSIS — M5412 Radiculopathy, cervical region: Secondary | ICD-10-CM | POA: Diagnosis not present

## 2022-10-11 DIAGNOSIS — M4722 Other spondylosis with radiculopathy, cervical region: Secondary | ICD-10-CM | POA: Diagnosis not present

## 2022-10-11 DIAGNOSIS — Z96643 Presence of artificial hip joint, bilateral: Secondary | ICD-10-CM | POA: Diagnosis not present

## 2022-10-11 DIAGNOSIS — G4733 Obstructive sleep apnea (adult) (pediatric): Secondary | ICD-10-CM | POA: Diagnosis not present

## 2022-10-11 DIAGNOSIS — Z86718 Personal history of other venous thrombosis and embolism: Secondary | ICD-10-CM | POA: Diagnosis not present

## 2022-10-11 DIAGNOSIS — Z87891 Personal history of nicotine dependence: Secondary | ICD-10-CM | POA: Diagnosis not present

## 2022-10-11 DIAGNOSIS — M4802 Spinal stenosis, cervical region: Secondary | ICD-10-CM | POA: Diagnosis not present

## 2022-10-11 MED ORDER — ENOXAPARIN SODIUM 40 MG/0.4ML IJ SOSY: 40.0000 mg | PREFILLED_SYRINGE | INTRAMUSCULAR | 0 refills | Status: DC

## 2022-10-11 MED ORDER — SENNA 8.6 MG PO TABS: 1.0000 | ORAL_TABLET | Freq: Every day | ORAL | 0 refills | Status: DC | PRN

## 2022-10-11 MED ORDER — OXYCODONE HCL 5 MG PO TABS: 5.0000 mg | ORAL_TABLET | ORAL | 0 refills | Status: DC | PRN

## 2022-10-11 MED ORDER — DIAZEPAM 2 MG PO TABS: 2.0000 mg | ORAL_TABLET | Freq: Three times a day (TID) | ORAL | 0 refills | Status: DC | PRN

## 2022-10-11 NOTE — Evaluation (Signed)
Occupational Therapy Evaluation Patient Details Name: Wendy Marshall MRN: [id number] DOB: 09-Jan-1956 Today's Date: 10/11/2022   History of Present Illness Pt is a pleasant 67 y.o presenting with cervical radiculopathy C4-5 C6-7 ACDF.   Clinical Impression   Chart reviewed, pt greeted in room agreeable to OT evaluation. Pt is alert and oriented x4, good safety awareness. Pt appears to be performing ADL/functional mobility at baseline. No functional deficits noted at this time. No further OT needs identified. Pt is in agreement. Please reconsult if there is a change in functional status.    Recommendations for follow up therapy are one component of a multi-disciplinary discharge planning process, led by the attending physician.  Recommendations may be updated based on patient status, additional functional criteria and insurance authorization.   Follow Up Recommendations  No OT follow up     Assistance Recommended at Discharge PRN  Patient can return home with the following A little help with walking and/or transfers;A little help with bathing/dressing/bathroom    Functional Status Assessment  Patient has had a recent decline in their functional status and demonstrates the ability to make significant improvements in function in a reasonable and predictable amount of time.  Equipment Recommendations  Other (comment) Management consultant)    Recommendations for Other Services       Precautions / Restrictions Precautions Precautions: Cervical Precaution Booklet Issued: Yes (comment) Precaution Comments: no BLT Restrictions Weight Bearing Restrictions: No      Mobility Bed Mobility Overal bed mobility: Modified Independent                  Transfers Overall transfer level: Modified independent Equipment used: None               General transfer comment: pt deferred use of RW      Balance Overall balance assessment: Modified Independent        ADL either performed  or assessed with clinical judgement   ADL Overall ADL's : Modified independent           General ADL Comments: Pt grossly Mod I for all ADLs including functional mobility to the bathroom, toilet transfer, clothing management and sinkside grooming. Encouraged use of LB dressing AE to prevent forward bending/reaching.     Vision Baseline Vision/History: 1 Wears glasses Patient Visual Report: No change from baseline       Perception     Praxis      Pertinent Vitals/Pain Pain Assessment Pain Assessment: 0-10 Pain Score: 3  Pain Location: UT's Pain Descriptors / Indicators: Sore, Aching Pain Intervention(s): Limited activity within patient's tolerance, Monitored during session, Premedicated before session     Hand Dominance Right   Extremity/Trunk Assessment Upper Extremity Assessment Upper Extremity Assessment: Overall WFL for tasks assessed   Lower Extremity Assessment Lower Extremity Assessment: Overall WFL for tasks assessed       Communication Communication Communication: No difficulties   Cognition Arousal/Alertness: Awake/alert Behavior During Therapy: WFL for tasks assessed/performed Overall Cognitive Status: Within Functional Limits for tasks assessed         General Comments       Exercises Other Exercises Other Exercises: OT provided education re: role of OT, OT POC, post acute recs, sitting up for all meals, EOB/OOB mobility with assistance, home/fall safety, log roll technique for bed mobility, no BLT, LB dressing AE   Shoulder Instructions      Home Living Family/patient expects to be discharged to:: Private residence Living Arrangements: Spouse/significant other Available Help at Discharge:  Family;Available 24 hours/day Type of Home: Mobile home Home Access: Stairs to enter Entrance Stairs-Number of Steps: 5 Entrance Stairs-Rails: Can reach both Home Layout: One level     Bathroom Shower/Tub: Occupational psychologist: Standard      Home Equipment: Shower seat;Hand held shower head          Prior Functioning/Environment Prior Level of Function : Independent/Modified Independent;Driving                        OT Problem List:        OT Treatment/Interventions:      OT Goals(Current goals can be found in the care plan section) Acute Rehab OT Goals Patient Stated Goal: return home OT Goal Formulation: All assessment and education complete, DC therapy  OT Frequency:      Co-evaluation              AM-PAC OT "6 Clicks" Daily Activity     Outcome Measure Help from another person eating meals?: None Help from another person taking care of personal grooming?: None Help from another person toileting, which includes using toliet, bedpan, or urinal?: None Help from another person bathing (including washing, rinsing, drying)?: None Help from another person to put on and taking off regular upper body clothing?: None Help from another person to put on and taking off regular lower body clothing?: None 6 Click Score: 24   End of Session Nurse Communication: Mobility status  Activity Tolerance: Patient tolerated treatment well Patient left: in bed;with call bell/phone within reach;with bed alarm set;with family/visitor present  OT Visit Diagnosis: Other abnormalities of gait and mobility (R26.89)                Time: [id number] OT Time Calculation (min): 13 min Charges:  OT General Charges $OT Visit: 1 Visit OT Evaluation $OT Eval Low Complexity: 1 Low  [facility] MS, OTR/L ascom [phone]  10/11/22, 2:27 PM

## 2022-10-11 NOTE — Progress Notes (Signed)
Reviewed discharged instructions. Pt verbalized understanding. Pt d/ced with all personal belongings. Staff wheeled pt oiut. Pt transported to home via family vehicle.

## 2022-10-11 NOTE — Plan of Care (Signed)
  Problem: Pain Management: Goal: Pain level will decrease 10/11/2022 1052 by Benney [place] Bet, LPN Outcome: Progressing 10/11/2022 1052 by Lathyn Griggs Bet, LPN Outcome: Progressing   Problem: Skin Integrity: Goal: Will show signs of wound healing 10/11/2022 1052 by Angele Wiemann Bet, LPN Outcome: Progressing 10/11/2022 1052 by Benedetto Ryder Bet, LPN Outcome: Progressing   Problem: Education: Goal: Knowledge of General Education information will improve Description: Including pain rating scale, medication(s)/side effects and non-pharmacologic comfort measures 10/11/2022 1052 by Jhovany Weidinger Bet, LPN Outcome: Progressing 10/11/2022 1052 by Jaysun Wessels Bet, LPN Outcome: Progressing   Problem: Safety: Goal: Ability to remain free from injury will improve 10/11/2022 1052 by Aaminah Forrester Bet, LPN Outcome: Progressing 10/11/2022 1052 by Shelisa Fern Bet, LPN Outcome: Progressing   Problem: Pain Managment: Goal: General experience of comfort will improve 10/11/2022 1052 by Kamren Heintzelman Bet, LPN Outcome: Progressing 10/11/2022 1052 by Maico Mulvehill Bet, LPN Outcome: Progressing   Problem: Skin Integrity: Goal: Risk for impaired skin integrity will decrease 10/11/2022 1052 by Shelle Galdamez Bet, LPN Outcome: Progressing 10/11/2022 1052 by Furkan Keenum Bet, LPN Outcome: Progressing

## 2022-10-11 NOTE — Evaluation (Signed)
Physical Therapy Evaluation Patient Details Name: Wendy Marshall MRN: [id number] DOB: 1956/06/05 Today's Date: 10/11/2022  History of Present Illness  Pt is a pleasant 67 y.o presenting with cervical radiculopathy C4-5 C6-7 ACDF.    Clinical Impression  Pt sitting EOB upon arrival to the room.  Pt states she is ready to discharge and mobilize as necessary to go home.  Pt demonstrates good technique with all activities, and is able to ambulate tot he therapy gym and perform stair training before returning to room.  Pt given verbal cues for proper technique and to not bend the neck downward to look at the stairs, instead, utilize heels in order to feel for next step before lowering to the next step.  Pt then ambulated back to the room and was left with husband and all needs met.  Patient is modI with all tasks, all education completed, and time is given to address all questions/concerns. No additional skilled PT services needed at this time, PT signing off. PT recommends daily ambulation ad lib or with nursing staff as needed to prevent deconditioning.         Recommendations for follow up therapy are one component of a multi-disciplinary discharge planning process, led by the attending physician.  Recommendations may be updated based on patient status, additional functional criteria and insurance authorization.  Follow Up Recommendations No PT follow up      Assistance Recommended at Discharge PRN  Patient can return home with the following  A little help with walking and/or transfers;A little help with bathing/dressing/bathroom    Equipment Recommendations None recommended by PT  Recommendations for Other Services       Functional Status Assessment Patient has had a recent decline in their functional status and demonstrates the ability to make significant improvements in function in a reasonable and predictable amount of time.     Precautions / Restrictions Precautions Precautions:  Cervical Precaution Booklet Issued: Yes (comment) Precaution Comments: BLT Restrictions Weight Bearing Restrictions: No      Mobility  Bed Mobility Overal bed mobility: Modified Independent                  Transfers Overall transfer level: Modified independent Equipment used: Rolling walker (2 wheels)               General transfer comment: increased time, but overall well.    Ambulation/Gait Ambulation/Gait assistance: Supervision Gait Distance (Feet): 280 Feet Assistive device: Rolling walker (2 wheels) Gait Pattern/deviations: WFL(Within Functional Limits) Gait velocity: decreased     General Gait Details: pt with good technique.  Stairs            Wheelchair Mobility    Modified Rankin (Stroke Patients Only)       Balance Overall balance assessment: Modified Independent                                           Pertinent Vitals/Pain Pain Assessment Pain Assessment: 0-10 Pain Score: 3  Pain Location: UT's Pain Descriptors / Indicators: Sore, Aching Pain Intervention(s): Limited activity within patient's tolerance, Monitored during session, Premedicated before session    Home Living Family/patient expects to be discharged to:: Private residence Living Arrangements: Spouse/significant other Available Help at Discharge: Family;Available 24 hours/day Type of Home: Mobile home Home Access: Stairs to enter Entrance Stairs-Rails: Can reach both Entrance Stairs-Number of Steps: 5  Home Layout: One level Home Equipment: Shower seat;Hand held shower head      Prior Function Prior Level of Function : Independent/Modified Independent                     Hand Dominance   Dominant Hand: Right    Extremity/Trunk Assessment   Upper Extremity Assessment Upper Extremity Assessment: Defer to OT evaluation    Lower Extremity Assessment Lower Extremity Assessment: Overall WFL for tasks assessed        Communication   Communication: No difficulties  Cognition Arousal/Alertness: Awake/alert Behavior During Therapy: WFL for tasks assessed/performed Overall Cognitive Status: Within Functional Limits for tasks assessed                                          General Comments      Exercises     Assessment/Plan    PT Assessment Patient does not need any further PT services  PT Problem List         PT Treatment Interventions      PT Goals (Current goals can be found in the Care Plan section)  Acute Rehab PT Goals Patient Stated Goal: to go home. PT Goal Formulation: With patient/family Time For Goal Achievement: 10/11/22 Potential to Achieve Goals: Good    Frequency Other (Comment) (d/c in house.)     Co-evaluation               AM-PAC PT "6 Clicks" Mobility  Outcome Measure Help needed turning from your back to your side while in a flat bed without using bedrails?: None Help needed moving from lying on your back to sitting on the side of a flat bed without using bedrails?: None Help needed moving to and from a bed to a chair (including a wheelchair)?: None Help needed standing up from a chair using your arms (e.g., wheelchair or bedside chair)?: None Help needed to walk in hospital room?: None Help needed climbing 3-5 steps with a railing? : None 6 Click Score: 24    End of Session Equipment Utilized During Treatment: Gait belt Activity Tolerance: Patient tolerated treatment well Patient left: in bed Nurse Communication: Mobility status PT Visit Diagnosis: Muscle weakness (generalized) (M62.81)    Time: [id number] PT Time Calculation (min) (ACUTE ONLY): 25 min   Charges:   PT Evaluation $PT Eval Low Complexity: 1 Low          Gwenlyn Saran, PT, DPT Physical Therapist - [facility]  10/11/22, 2:06 PM

## 2022-10-11 NOTE — Discharge Instructions (Signed)
Your surgeon has performed an operation on your cervical spine (neck) to relieve pressure on the spinal cord and/or nerves. This involved making an incision in the front of your neck and removing one or more of the discs that support your spine. Next, a small piece of bone, a titanium plate, and screws were used to fuse two or more of the vertebrae (bones) together.  The following are instructions to help in your recovery once you have been discharged from the hospital. Even if you feel well, it is important that you follow these activity guidelines. If you do not let your neck heal properly from the surgery, you can increase the chance of return of your symptoms and other complications.  * Do not take anti-inflammatory medications for 3 months after surgery (naproxen [Aleve], ibuprofen [Advil, Motrin]. These medications can prevent your bones from healing properly. *Celebrex is ok  Activity    No bending, lifting, or twisting ("BLT"). Avoid lifting objects heavier than 10 pounds (gallon milk jug).  Where possible, avoid household activities that involve lifting, bending, reaching, pushing, or pulling such as laundry, vacuuming, grocery shopping, and childcare. Try to arrange for help from friends and family for these activities while your back heals.  Increase physical activity slowly as tolerated.  Taking short walks is encouraged, but avoid strenuous exercise. Do not jog, run, bicycle, lift weights, or participate in any other exercises unless specifically allowed by your doctor.  Talk to your doctor before resuming sexual activity.  You should not drive until cleared by your doctor.  Until released by your doctor, you should not return to work or school.  You should rest at home and let your body heal.   You may shower three days after your surgery.  After showering, lightly dab your incision dry. Do not take a tub bath or go swimming until approved by your doctor at your follow-up  appointment.  If your doctor ordered a cervical collar (neck brace) for you, you should wear it whenever you are out of bed. You may remove it when lying down or sleeping, but you should wear it at all other times. Not all neck surgeries require a cervical collar.  If you smoke, we strongly recommend that you quit.  Smoking has been proven to interfere with normal bone healing and will dramatically reduce the success rate of your surgery. Please contact [facility] (800-QUIT-NOW) and use the resources at www.[facility].com for assistance in stopping smoking.  Surgical Incision   If you have a dressing on your incision, you may remove it two days after your surgery. Keep your incision area clean and dry.  If you have staples or stitches on your incision, you should have a follow up scheduled for removal. If you do not have staples or stitches, you will have steri-strips (small pieces of surgical tape) or Dermabond glue. The steri-strips/glue should begin to peel away within about a week (it is fine if the steri-strips fall off before then). If the strips are still in place one week after your surgery, you may gently remove them.  Diet           You may return to your usual diet. However, you may experience discomfort when swallowing in the first month after your surgery. This is normal. You may find that softer foods are more comfortable for you to swallow. Be sure to stay hydrated.  When to Contact us  You may experience pain in your neck and/or pain between your shoulder  blades. This is normal and should improve in the next few weeks with the help of pain medication, muscle relaxers, and rest. Some patients report that a warm compress on the back of the neck or between the shoulder blades helps.  However, should you experience any of the following, contact us immediately: New numbness or weakness Pain that is progressively getting worse, and is not relieved by your pain medication, muscle  relaxers, rest, and warm compresses Bleeding, redness, swelling, pain, or drainage from surgical incision Chills or flu-like symptoms Fever greater than 101.0 F (38.3 C) Inability to eat, drink fluids, or take medications Problems with bowel or bladder functions Difficulty breathing or shortness of breath Warmth, tenderness, or swelling in your calf Contact Information During office hours (Monday-Friday 9 am to 5 pm), please call your physician at [phone] and ask for Berdine Addison After hours and weekends, please call [phone] and speak with the answering service, who will contact the doctor on call.   For a life-threatening emergency, call 911

## 2022-10-11 NOTE — Discharge Summary (Signed)
Discharge Summary  Patient ID: Wendy Marshall MRN: [id number] DOB/AGE: [birth year] 67 y.o.  Admit date: 10/10/2022 Discharge date: 10/11/2022  Admission Diagnoses: Cervical radiculopathy   Discharge Diagnoses:  Principal Problem:   S/P cervical spinal fusion   Discharged Condition: good  Hospital Course:  GRATIA FARAR is a pleasant 67 y.o presenting with cervical radiculopathy C4-5 C6-7 ACDF.  Her intraoperative course was uncomplicated and she ended up overnight for drain output monitoring pain control. She did experience some burning pain into her left shoulder postop day 0 and 1 but states that overall her pain is significantly improved from preop.  Her drain output was minimal and removed on postop day 1.  She was discharged home with medications for pain, Valium for muscle relaxation as she has had adverse reactions to muscle relaxers in the past, senna, and Lovenox given her history of PE and DVT.  Consults: None  Significant Diagnostic Studies: none  Treatments: surgery: As above. please see separately dictated operative report for further details  Discharge Exam: Blood pressure 112/60, pulse 79, temperature 98.1 F (36.7 C), resp. rate 16, height '5\' 3"'$  (1.6 m), weight 102.7 kg, SpO2 93 %. CN II-XII grossly intact 5/5 throughout BUE  Incision c/d/I with dermabond in place  Disposition: Discharge disposition: 01-Home or Self Care       Discharge Instructions     Incentive spirometry RT   Complete by: As directed       Allergies as of 10/11/2022       Reactions   Penicillins Hives, Shortness Of Breath, Other (See Comments)   Adhesive [tape] Other (See Comments)   SKIN TEARS-ok to use paper tape   Contrast Media [iodinated Contrast Media] Hives   With CT contrast dye   Flexeril [cyclobenzaprine] Other (See Comments)   AFFECTED HEART RATE   Omnipaque [iohexol] Hives   Soma [carisoprodol] Other (See Comments)   AFFECTED HEART RATE   Metaxalone Other  (See Comments)   Methocarbamol Other (See Comments)   Nsaids Other (See Comments)   Can't take due to being on Eliquis   Other Other (See Comments)   Muscle relaxer- heart problems    Tizanidine Other (See Comments)        Medication List     STOP taking these medications    Eliquis 5 MG Tabs tablet Generic drug: apixaban       TAKE these medications    COQ10 PO Take by mouth.   diazepam 2 MG tablet Commonly known as: Valium Take 1 tablet (2 mg total) by mouth every 8 (eight) hours as needed for muscle spasms.   enoxaparin 40 MG/0.4ML injection Commonly known as: LOVENOX Inject 0.4 mLs (40 mg total) into the skin daily. Start taking on: October 12, 2022   levothyroxine 112 MCG tablet Commonly known as: SYNTHROID TAKE 1/2 TABLET BY MOUTH ON MONDAY, FRIDAY, AND A WHOLE TABLET ON ALL OTHER DAYS What changed: See the new instructions.   Misc. Devices Misc Please provide patient with pair of compression stocking 15-20 mmHg Dx: [id number] DVT of left peroneal vein   multivitamin tablet Take 1 tablet by mouth daily.   oxyCODONE 5 MG immediate release tablet Commonly known as: Oxy IR/ROXICODONE Take 1 tablet (5 mg total) by mouth every 3 (three) hours as needed for moderate pain ((score 4 to 6)).   QC TUMERIC COMPLEX PO Take by mouth as needed.   rosuvastatin 10 MG tablet Commonly known as: CRESTOR Take 1 tablet by mouth once daily  What changed: when to take this   senna 8.6 MG Tabs tablet Commonly known as: SENOKOT Take 1 tablet (8.6 mg total) by mouth daily as needed for mild constipation.         Signed: Loleta Dicker 10/11/2022, 11:14 AM

## 2022-10-14 ENCOUNTER — Encounter: Payer: Self-pay | Admitting: Neurosurgery

## 2022-10-17 ENCOUNTER — Emergency Department (HOSPITAL_COMMUNITY)
Admission: EM | Admit: 2022-10-17 | Discharge: 2022-10-17 | Disposition: A | Discharge status: Home / Self Care | Payer: Medicare HMO | Attending: Emergency Medicine | Admitting: Emergency Medicine

## 2022-10-17 ENCOUNTER — Encounter (HOSPITAL_COMMUNITY): Payer: Self-pay

## 2022-10-17 ENCOUNTER — Other Ambulatory Visit: Payer: Self-pay

## 2022-10-17 DIAGNOSIS — Z87891 Personal history of nicotine dependence: Secondary | ICD-10-CM | POA: Insufficient documentation

## 2022-10-17 DIAGNOSIS — Z5189 Encounter for other specified aftercare: Secondary | ICD-10-CM | POA: Insufficient documentation

## 2022-10-17 DIAGNOSIS — Z4801 Encounter for change or removal of surgical wound dressing: Secondary | ICD-10-CM | POA: Diagnosis not present

## 2022-10-17 DIAGNOSIS — E039 Hypothyroidism, unspecified: Secondary | ICD-10-CM | POA: Diagnosis not present

## 2022-10-17 MED ORDER — DOXYCYCLINE HYCLATE 100 MG PO TABS
100.0000 mg | ORAL_TABLET | Freq: Once | ORAL | Status: AC
  Administered 2022-10-17: 100 mg via ORAL
  Filled 2022-10-17: qty 1

## 2022-10-17 MED ORDER — DOXYCYCLINE HYCLATE 100 MG PO CAPS: 100.0000 mg | ORAL_CAPSULE | Freq: Two times a day (BID) | ORAL | 0 refills | Status: AC

## 2022-10-17 NOTE — Discharge Instructions (Signed)
You were evaluated in the Emergency Department and after careful evaluation, we did not find any emergent condition requiring admission or further testing in the hospital.  Your exam/testing today was overall reassuring.  We are treating you for a possible skin infection of the surgical wound/incision site.  Take the doxycycline antibiotic as directed.  Keep close follow-up with your surgeon.  Please return to the Emergency Department if you experience any worsening of your condition.  Thank you for allowing [place] to be a part of your care.

## 2022-10-17 NOTE — ED Provider Notes (Signed)
[facility] Emergency Department Provider Note MRN:  [id number]  Arrival date & time: 10/17/22     Chief Complaint   Post-op Problem   History of Present Illness   Wendy Marshall is a 67 y.o. year-old female with a history of cervical radiculopathy presenting to the ED with chief complaint of postop problem.  Patient had neck surgery 7 days ago.  Has been recovering fairly well.  Noticed some leaking of fluid from the anterior incision site of the neck this evening.  Here for evaluation.  Denies fever, no significant neck or back pain, the incision site is not painful.  No new numbness or weakness to the arms or legs.  Review of Systems  A thorough review of systems was obtained and all systems are negative except as noted in the HPI and PMH.   Patient's Health History    Past Medical History:  Diagnosis Date   Anemia    Arthritis    "all over"   Chondromalacia of knee, right 0000000   Complication of anesthesia    a.) delayed emergence   Dental crowns present    x 2   Difficulty swallowing pills    since cervical fusion   Family history of adverse reaction to anesthesia    a.) delayed emergence in 1st degree relative (mother)   Fibromyalgia    GERD (gastroesophageal reflux disease)    Headache    x 3 days (08/18/2017)   High cholesterol    History of hiatal hernia    History of pulmonary embolus (PE) 03/09/2016   bilateral   Hyperlipidemia    Hypothyroidism    Limited joint range of motion (ROM)    cervical spine - s/p fusion   Long term current use of anticoagulant    a.) apixaban   Medial meniscus tear 08/2017   right knee   OSA on CPAP    PONV (postoperative nausea and vomiting)    happened years ago-nothing recently   Pulmonary embolism ([place]) 2019   Sleep apnea    a.) requires nocturnal PAP therapy    Past Surgical History:  Procedure Laterality Date   ANTERIOR CERVICAL DECOMP/DISCECTOMY FUSION  07/18/2002   C5-6   ANTERIOR  CERVICAL DECOMP/DISCECTOMY FUSION N/A 10/10/2022   Procedure: C4-5 AND C6-7 ANTERIOR CERVICAL DISCECTOMY AND FUSION;  Surgeon: Meade Maw, MD;  Location: [facility];  Service: Neurosurgery;  Laterality: N/A;   CARPAL TUNNEL RELEASE Bilateral 2004   CHONDROPLASTY Right 08/23/2017   Procedure: CHONDROPLASTY;  Surgeon: Frederik Pear, MD;  Location: [facility];  Service: Orthopedics;  Laterality: Right;   EYE MUSCLE SURGERY Bilateral    KNEE ARTHROSCOPY Right 08/23/2017   Procedure: ARTHROSCOPY RIGHT KNEE REMOVAL OF LOOSE BODIES;  Surgeon: Frederik Pear, MD;  Location: [facility];  Service: Orthopedics;  Laterality: Right;   KNEE ARTHROSCOPY WITH MEDIAL MENISECTOMY Right 08/23/2017   Procedure: KNEE ARTHROSCOPY WITH MEDIAL MENISECTOMY;  Surgeon: Frederik Pear, MD;  Location: [facility];  Service: Orthopedics;  Laterality: Right;   KNEE SURGERY Right 03/2019   SHOULDER ARTHROSCOPY Left 06/11/2001   TONSILLECTOMY     age 41   TOTAL HIP ARTHROPLASTY Left 02/01/2016   Procedure: LEFT TOTAL HIP ARTHROPLASTY ANTERIOR APPROACH;  Surgeon: Paralee Cancel, MD;  Location: [facility];  Service: Orthopedics;  Laterality: Left;  Failed Spinal to LMA   TOTAL HIP ARTHROPLASTY Right 05/2019   TUBAL LIGATION     WISDOM TOOTH EXTRACTION  Family History  Problem Relation Age of Onset   Hypertension Mother    Anesthesia problems Mother        hard to wake up post-op   Brain cancer Father    Colon cancer Other    Heart attack Other    Throat cancer Other    Colon cancer Paternal Grandmother    Heart attack Maternal Grandfather    Throat cancer Maternal Grandfather     Social History   Socioeconomic History   Marital status: Married    Spouse name: [name]   Number of children: 2   Years of education: Not on file   Highest education level: High school graduate  Occupational History   Occupation: Surveyor, quantity    Comment: retired  Tobacco Use   Smoking  status: Former    Packs/day: 0.00    Years: 11.00    Additional pack years: 0.00    Total pack years: 0.00    Types: Cigarettes    Quit date: 07/31/1996    Years since quitting: 26.2   Smokeless tobacco: Never  Vaping Use   Vaping Use: Never used  Substance and Sexual Activity   Alcohol use: Not Currently   Drug use: No   Sexual activity: Yes    Birth control/protection: None, Post-menopausal, Surgical    Comment: tubal  Other Topics Concern   Not on file  Social History Narrative   Lives with spouse   Social Determinants of Health   Financial Resource Strain: Low Risk  (01/04/2022)   Overall Financial Resource Strain (CARDIA)    Difficulty of Paying Living Expenses: Not very hard  Food Insecurity: No Food Insecurity (10/10/2022)   Hunger Vital Sign    Worried About Running Out of Food in the Last Year: Never true    Balm in the Last Year: Never true  Transportation Needs: No Transportation Needs (10/10/2022)   PRAPARE - Hydrologist (Medical): No    Lack of Transportation (Non-Medical): No  Physical Activity: Insufficiently Active (01/04/2022)   Exercise Vital Sign    Days of Exercise per Week: 3 days    Minutes of Exercise per Session: 30 min  Stress: Stress Concern Present (01/04/2022)   [place]    Feeling of Stress : To some extent  Social Connections: Socially Integrated (01/04/2022)   Social Connection and Isolation Panel [NHANES]    Frequency of Communication with Friends and Family: More than three times a week    Frequency of Social Gatherings with Friends and Family: More than three times a week    Attends Religious Services: More than 4 times per year    Active Member of [employer] or Organizations: Yes    Attends Music therapist: More than 4 times per year    Marital Status: Married  Human resources officer Violence: Not At Risk (10/10/2022)   Humiliation,  Afraid, Rape, and Kick questionnaire    Fear of Current or Ex-Partner: No    Emotionally Abused: No    Physically Abused: No    Sexually Abused: No     Physical Exam   Vitals:   10/17/22 2320  BP: (!) 148/96  Pulse: 100  Resp: 18  Temp: 97.8 F (36.6 C)  SpO2: 97%    CONSTITUTIONAL: Well-appearing, NAD NEURO/PSYCH:  Alert and oriented x 3, no focal deficits EYES:  eyes equal and reactive ENT/NECK:  no LAD, no JVD CARDIO: Regular  rate, well-perfused, normal S1 and S2 PULM:  CTAB no wheezing or rhonchi GI/GU:  non-distended, non-tender MSK/SPINE:  No gross deformities, no edema SKIN:  no rash, atraumatic   *Additional and/or pertinent findings included in MDM below  Diagnostic and Interventional Summary    EKG Interpretation  Date/Time:    Ventricular Rate:    PR Interval:    QRS Duration:   QT Interval:    QTC Calculation:   R Axis:     Text Interpretation:         Labs Reviewed - No data to display  No orders to display    Medications  doxycycline (VIBRA-TABS) tablet 100 mg (has no administration in time range)     Procedures  /  Critical Care Procedures  ED Course and Medical Decision Making  Initial Impression and Ddx Crusting appearance of the anterior neck incision site/wound.  Small amount of surrounding erythema.  No purulence.  Afebrile with reassuring vital signs, denies fever, no concerning neurological symptoms or deficits, no systemic symptoms of infection.  Favoring seroma but will provide course of antibiotics to treat for possible wound infection.  Doubt deeper space infection or need for labs or imaging at this time.  She has follow-up with her surgeon next week.  Past medical/surgical history that increases complexity of ED encounter: Cervical radiculopathy postop day 7 from anterior approach repair  Interpretation of Diagnostics Laboratory and/or imaging options to aid in the diagnosis/care of the patient were considered.  After careful  history and physical examination, it was determined that there was no indication for diagnostics at this time.  Patient Reassessment and Ultimate Disposition/Management     Discharge  Patient management required discussion with the following services or consulting groups:  None  Complexity of Problems Addressed Acute complicated illness or Injury  Additional Data Reviewed and Analyzed Further history obtained from: Recent Consult notes  Additional Factors Impacting ED Encounter Risk Prescriptions  Barth Kirks. Sedonia Small, [place] [email or web address]  Final Clinical Impressions(s) / ED Diagnoses     ICD-10-CM   1. Visit for wound check  Z51.89       ED Discharge Orders          Ordered    doxycycline (VIBRAMYCIN) 100 MG capsule  2 times daily        10/17/22 2340             Discharge Instructions Discussed with and Provided to Patient:    Discharge Instructions      You were evaluated in the Emergency Department and after careful evaluation, we did not find any emergent condition requiring admission or further testing in the hospital.  Your exam/testing today was overall reassuring.  We are treating you for a possible skin infection of the surgical wound/incision site.  Take the doxycycline antibiotic as directed.  Keep close follow-up with your surgeon.  Please return to the Emergency Department if you experience any worsening of your condition.  Thank you for allowing [place] to be a part of your care.       Maudie Flakes, MD 10/17/22 [phone]

## 2022-10-17 NOTE — ED Triage Notes (Addendum)
Pt had spinal fusion last Monday and states that today she started feeling bad and woke up and states that her incision to her neck was draining.

## 2022-10-17 NOTE — ED Notes (Signed)
ED Provider at bedside. 

## 2022-10-18 DIAGNOSIS — G4733 Obstructive sleep apnea (adult) (pediatric): Secondary | ICD-10-CM | POA: Diagnosis not present

## 2022-10-20 DIAGNOSIS — I2782 Chronic pulmonary embolism: Secondary | ICD-10-CM | POA: Diagnosis not present

## 2022-10-20 DIAGNOSIS — E785 Hyperlipidemia, unspecified: Secondary | ICD-10-CM | POA: Diagnosis not present

## 2022-10-20 DIAGNOSIS — M1990 Unspecified osteoarthritis, unspecified site: Secondary | ICD-10-CM | POA: Diagnosis not present

## 2022-10-20 DIAGNOSIS — G4733 Obstructive sleep apnea (adult) (pediatric): Secondary | ICD-10-CM | POA: Diagnosis not present

## 2022-10-20 DIAGNOSIS — Z87891 Personal history of nicotine dependence: Secondary | ICD-10-CM | POA: Diagnosis not present

## 2022-10-20 DIAGNOSIS — Z8249 Family history of ischemic heart disease and other diseases of the circulatory system: Secondary | ICD-10-CM | POA: Diagnosis not present

## 2022-10-20 DIAGNOSIS — I739 Peripheral vascular disease, unspecified: Secondary | ICD-10-CM | POA: Diagnosis not present

## 2022-10-20 DIAGNOSIS — Z880 Allergy status to penicillin: Secondary | ICD-10-CM | POA: Diagnosis not present

## 2022-10-20 DIAGNOSIS — E039 Hypothyroidism, unspecified: Secondary | ICD-10-CM | POA: Diagnosis not present

## 2022-10-20 DIAGNOSIS — Z7901 Long term (current) use of anticoagulants: Secondary | ICD-10-CM | POA: Diagnosis not present

## 2022-10-20 DIAGNOSIS — Z96649 Presence of unspecified artificial hip joint: Secondary | ICD-10-CM | POA: Diagnosis not present

## 2022-10-21 NOTE — Progress Notes (Signed)
   REFERRING PHYSICIAN:  Babs Sciara, Md [address] [place],  [place] [address]  DOS: 10/10/22  ACDF C4-C5, removal of C5-C6 plate, ACDF U0-A5  HISTORY OF PRESENT ILLNESS: Wendy Marshall is 2 weeks status post ACDF C4-C5 and C6-C6 with removal of plage C5-C6. Given valium and oxycodone on discharge from the hospital. She was sent home on Lovenox as well due to history of DVT/PE.  She woke up and her chest felt wet, she thinks this was drainage from her incision. Was seen in ED and she was placed on 7 day course of doxycycline. After this she developed a red itchy rash on her chest. It is improving.   Her severe preop pain is gone! She notes some mild left shoulder pain and some numbness/tingling in her left hand. No further drainage from her incision. She finished the doxycycline. She has only take a few valium. She is taking prn oxycodone.   She did her last lovenox injection this morning.   PHYSICAL EXAMINATION:  NEUROLOGICAL:  General: In no acute distress.   Awake, alert, oriented to person, place, and time.  Pupils equal round and reactive to light.  Facial tone is symmetric.    Strength: Side Biceps Triceps Deltoid Interossei Grip Wrist Ext. Wrist Flex.  R 5 5 5 5 5 5 5   L 5 5 5 5 5 5 5    Incision c/d/I. See picture of rash below.     Imaging:  Nothing new to review.   Assessment / Plan: JENCIE CUADRAS is doing well s/p above surgery. Incision looks great. Treatment options reviewed with patient and following plan made:   - We discussed activity escalation and I have advised the patient to lift up to 10 pounds until 6 weeks after surgery (until follow up with Dr. Myer Haff).   - Reviewed wound care.  - Continue current medications including prn oxycodone. She has 6 left and is not taking much. Will call if she needs a refill.  - She did last lovenox injection this morning. She will restart ELIQUIS tomorrow.  - Can use OTC cream for rash. If it gets  worse, she is to follow up with PCP. She thinks it is improving. Not sure of cause, maybe from doxycycline? Not likely from prep in OR as it started 1 week postop.  - Follow up as scheduled in 4 weeks and prn.   Advised to contact the office if any questions or concerns arise.   Drake Leach PA-C Dept of Neurosurgery

## 2022-10-24 ENCOUNTER — Telehealth: Payer: Self-pay

## 2022-10-24 NOTE — Telephone Encounter (Signed)
     Patient  visit on 3/18  at [facility] you been able to follow up with your primary care physician? Yes   The patient was or was not able to obtain any needed medicine or equipment. Yes   Are there diet recommendations that you are having difficulty following? Na   Patient expresses understanding of discharge instructions and education provided has no other needs at this time.  Yes      [facility] [phone] [address], [place], [place] [address] Phone: [phone] Email: [email or web address]

## 2022-10-25 ENCOUNTER — Ambulatory Visit (INDEPENDENT_AMBULATORY_CARE_PROVIDER_SITE_OTHER): Payer: Medicare HMO | Admitting: Orthopedic Surgery

## 2022-10-25 ENCOUNTER — Encounter: Payer: Self-pay | Admitting: Orthopedic Surgery

## 2022-10-25 VITALS — BP 130/82 | Temp 97.6°F | Ht 63.0 in | Wt 220.0 lb

## 2022-10-25 DIAGNOSIS — M5412 Radiculopathy, cervical region: Secondary | ICD-10-CM

## 2022-10-25 DIAGNOSIS — Z09 Encounter for follow-up examination after completed treatment for conditions other than malignant neoplasm: Secondary | ICD-10-CM

## 2022-10-25 DIAGNOSIS — M4802 Spinal stenosis, cervical region: Secondary | ICD-10-CM

## 2022-10-25 DIAGNOSIS — Z981 Arthrodesis status: Secondary | ICD-10-CM

## 2022-11-18 DIAGNOSIS — G4733 Obstructive sleep apnea (adult) (pediatric): Secondary | ICD-10-CM | POA: Diagnosis not present

## 2022-11-21 ENCOUNTER — Other Ambulatory Visit: Payer: Self-pay

## 2022-11-21 DIAGNOSIS — M5412 Radiculopathy, cervical region: Secondary | ICD-10-CM

## 2022-11-21 DIAGNOSIS — M4802 Spinal stenosis, cervical region: Secondary | ICD-10-CM

## 2022-11-22 ENCOUNTER — Encounter: Payer: Self-pay | Admitting: Neurosurgery

## 2022-11-22 ENCOUNTER — Ambulatory Visit
Admission: RE | Admit: 2022-11-22 | Discharge: 2022-11-22 | Disposition: A | Discharge status: Home / Self Care | Payer: Medicare HMO | Attending: Neurosurgery | Admitting: Neurosurgery

## 2022-11-22 ENCOUNTER — Ambulatory Visit (INDEPENDENT_AMBULATORY_CARE_PROVIDER_SITE_OTHER): Payer: Medicare HMO | Admitting: Neurosurgery

## 2022-11-22 ENCOUNTER — Ambulatory Visit
Admission: RE | Admit: 2022-11-22 | Discharge: 2022-11-22 | Disposition: A | Discharge status: Home / Self Care | Payer: Medicare HMO | Source: Ambulatory Visit | Attending: Neurosurgery | Admitting: Neurosurgery

## 2022-11-22 VITALS — BP 128/78 | Ht 63.0 in | Wt 220.0 lb

## 2022-11-22 DIAGNOSIS — M5412 Radiculopathy, cervical region: Secondary | ICD-10-CM

## 2022-11-22 DIAGNOSIS — Z09 Encounter for follow-up examination after completed treatment for conditions other than malignant neoplasm: Secondary | ICD-10-CM

## 2022-11-22 DIAGNOSIS — M4802 Spinal stenosis, cervical region: Secondary | ICD-10-CM | POA: Diagnosis not present

## 2022-11-22 DIAGNOSIS — Z4789 Encounter for other orthopedic aftercare: Secondary | ICD-10-CM | POA: Diagnosis not present

## 2022-11-22 NOTE — Progress Notes (Signed)
   REFERRING PHYSICIAN:  Babs Sciara, Md [address] [place],  [place] [address]  DOS: 10/10/22  ACDF C4-C5, removal of C5-C6 plate, ACDF U0-A5  HISTORY OF PRESENT ILLNESS: Benita Stabile is status post ACDF C4-C5 and C6-C7 with removal of plate W0-J8.  She is doing very well from surgery.  She has no symptoms of infection.  Her pain is gone.     PHYSICAL EXAMINATION:  NEUROLOGICAL:  General: In no acute distress.   Awake, alert, oriented to person, place, and time.  Pupils equal round and reactive to light.  Facial tone is symmetric.    Strength: Side Biceps Triceps Deltoid Interossei Grip Wrist Ext. Wrist Flex.  R L Incision c/d/I.     Imaging:  No complications noted  Assessment / Plan: LAQUEISHA CATALINA is doing well s/p above surgery.  She is doing extremely well from her surgery.  We discussed her activity limitations.  We will see her back in clinic in approximately 6 weeks.    Venetia Night MD Dept of Neurosurgery

## 2022-11-30 ENCOUNTER — Other Ambulatory Visit: Payer: Self-pay | Admitting: Family Medicine

## 2022-12-01 ENCOUNTER — Other Ambulatory Visit (HOSPITAL_COMMUNITY): Payer: Self-pay | Admitting: Physician Assistant

## 2022-12-01 MED ORDER — APIXABAN 5 MG PO TABS: 5.0000 mg | ORAL_TABLET | Freq: Two times a day (BID) | ORAL | 7 refills | Status: DC

## 2022-12-27 ENCOUNTER — Other Ambulatory Visit: Payer: Self-pay | Admitting: Orthopedic Surgery

## 2022-12-27 DIAGNOSIS — M5412 Radiculopathy, cervical region: Secondary | ICD-10-CM

## 2022-12-27 NOTE — Progress Notes (Unsigned)
   REFERRING PHYSICIAN:  Babs Sciara, Md [address] [place],  [place] [address]  DOS: 10/10/22  ACDF C4-C5, removal of C5-C6 plate, ACDF G9-F6  HISTORY OF PRESENT ILLNESS:  Wendy Marshall was doing very well at her last visit with Dr. Myer Haff. She had no pain.        PHYSICAL EXAMINATION:  NEUROLOGICAL:  General: In no acute distress.   Awake, alert, oriented to person, place, and time.  Pupils equal round and reactive to light.  Facial tone is symmetric.    Strength: Side Biceps Triceps Deltoid Interossei Grip Wrist Ext. Wrist Flex.  R 5 5 5 5 5 5 5   L 5 5 5 5 5 5 5    Incision well healed.   Imaging:  Cervical xrays dated ***:  No complications noted. ***   Assessment / Plan: Wendy Marshall is doing well s/p above surgery. Treatment options reviewed with patient and following plan made:   - We discussed activity escalation and I have advised the patient to lift up to 10 pounds until 6 weeks after surgery (until follow up with Dr. Myer Haff).   - Reviewed wound care.  - Continue current medications including prn oxycodone. She has 6 left and is not taking much. Will call if she needs a refill.  - She did last lovenox injection this morning. She will restart ELIQUIS tomorrow.  - Can use OTC cream for rash. If it gets worse, she is to follow up with PCP. She thinks it is improving. Not sure of cause, maybe from doxycycline? Not likely from prep in OR as it started 1 week postop.  - Follow up as scheduled in 4 weeks and prn.   Advised to contact the office if any questions or concerns arise.   Drake Leach PA-C Dept of Neurosurgery

## 2022-12-29 ENCOUNTER — Ambulatory Visit
Admission: RE | Admit: 2022-12-29 | Discharge: 2022-12-29 | Disposition: A | Discharge status: Home / Self Care | Payer: Medicare HMO | Source: Ambulatory Visit | Attending: Orthopedic Surgery | Admitting: Orthopedic Surgery

## 2022-12-29 ENCOUNTER — Encounter: Payer: Self-pay | Admitting: Orthopedic Surgery

## 2022-12-29 ENCOUNTER — Ambulatory Visit (INDEPENDENT_AMBULATORY_CARE_PROVIDER_SITE_OTHER): Payer: Medicare HMO | Admitting: Orthopedic Surgery

## 2022-12-29 VITALS — BP 127/82 | Temp 98.0°F | Ht 63.0 in | Wt 220.0 lb

## 2022-12-29 DIAGNOSIS — Z981 Arthrodesis status: Secondary | ICD-10-CM | POA: Diagnosis not present

## 2022-12-29 DIAGNOSIS — Z09 Encounter for follow-up examination after completed treatment for conditions other than malignant neoplasm: Secondary | ICD-10-CM

## 2022-12-29 DIAGNOSIS — M5412 Radiculopathy, cervical region: Secondary | ICD-10-CM | POA: Insufficient documentation

## 2022-12-29 DIAGNOSIS — Z4789 Encounter for other orthopedic aftercare: Secondary | ICD-10-CM | POA: Diagnosis not present

## 2023-01-13 ENCOUNTER — Ambulatory Visit (INDEPENDENT_AMBULATORY_CARE_PROVIDER_SITE_OTHER): Payer: Medicare HMO

## 2023-01-13 VITALS — Ht 64.0 in | Wt 225.0 lb

## 2023-01-13 DIAGNOSIS — Z0000 Encounter for general adult medical examination without abnormal findings: Secondary | ICD-10-CM

## 2023-01-13 NOTE — Patient Instructions (Signed)
Wendy Marshall , Thank you for taking time to come for your Medicare Wellness Visit. I appreciate your ongoing commitment to your health goals. Please review the following plan we discussed and let me know if I can assist you in the future.   These are the goals we discussed:  Goals       Exercise 3x per week (30 min per time)      01/04/22-Continue to exercise and stay active.       Patient Stated (pt-stated)      Patients goal is to get her yard put in.       Weight (lb) < 190 lb (86.2 kg)        This is a list of the screening recommended for you and due dates:  Health Maintenance  Topic Date Due   Zoster (Shingles) Vaccine (1 of 2) Never done   COVID-19 Vaccine (4 - 2023-24 season) 04/01/2022   Pap Smear  09/09/2022   Flu Shot  03/02/2023   Mammogram  05/21/2023   Medicare Annual Wellness Visit  01/13/2024   Colon Cancer Screening  09/12/2024   DTaP/Tdap/Td vaccine (3 - Td or Tdap) 07/06/2030   Hepatitis C Screening  Completed   HIV Screening  Completed   HPV Vaccine  Aged Out    Advanced directives: Advance directive discussed with you today. Even though you declined this today, please call our office should you change your mind, and we can give you the proper paperwork for you to fill out. Advance care planning is a way to make decisions about medical care that fits your values in case you are ever unable to make these decisions for yourself.  Information on Advanced Care Planning can be found at [facility] [place] Advance Health Care Directives Advance Health Care Directives ([email or web address]    Conditions/risks identified: Aim for 30 minutes of exercise or brisk walking, 6-8 glasses of water, and 5 servings of fruits and vegetables each day.  Good luck on getting your yard put in!!!  Next appointment: Follow up in one year for your annual wellness visit   January 19, 2024 at 1pm telephone visit   Preventive Care 65 Years and Older, Female Preventive care  refers to lifestyle choices and visits with your health care provider that can promote health and wellness. What does preventive care include? A yearly physical exam. This is also called an annual well check. Dental exams once or twice a year. Routine eye exams. Ask your health care provider how often you should have your eyes checked. Personal lifestyle choices, including: Daily care of your teeth and gums. Regular physical activity. Eating a healthy diet. Avoiding tobacco and drug use. Limiting alcohol use. Practicing safe sex. Taking low-dose aspirin every day. Taking vitamin and mineral supplements as recommended by your health care provider. What happens during an annual well check? The services and screenings done by your health care provider during your annual well check will depend on your age, overall health, lifestyle risk factors, and family history of disease. Counseling  Your health care provider may ask you questions about your: Alcohol use. Tobacco use. Drug use. Emotional well-being. Home and relationship well-being. Sexual activity. Eating habits. History of falls. Memory and ability to understand (cognition). Work and work Astronomer. Reproductive health. Screening  You may have the following tests or measurements: Height, weight, and BMI. Blood pressure. Lipid and cholesterol levels. These may be checked every 5 years, or more frequently if you are over  19 years old. Skin check. Lung cancer screening. You may have this screening every year starting at age 26 if you have a 30-pack-year history of smoking and currently smoke or have quit within the past 15 years. Fecal occult blood test (FOBT) of the stool. You may have this test every year starting at age 14. Flexible sigmoidoscopy or colonoscopy. You may have a sigmoidoscopy every 5 years or a colonoscopy every 10 years starting at age 64. Hepatitis C blood test. Hepatitis B blood test. Sexually transmitted  disease (STD) testing. Diabetes screening. This is done by checking your blood sugar (glucose) after you have not eaten for a while (fasting). You may have this done every 1-3 years. Bone density scan. This is done to screen for osteoporosis. You may have this done starting at age 23. Mammogram. This may be done every 1-2 years. Talk to your health care provider about how often you should have regular mammograms. Talk with your health care provider about your test results, treatment options, and if necessary, the need for more tests. Vaccines  Your health care provider may recommend certain vaccines, such as: Influenza vaccine. This is recommended every year. Tetanus, diphtheria, and acellular pertussis (Tdap, Td) vaccine. You may need a Td booster every 10 years. Zoster vaccine. You may need this after age 82. Pneumococcal 13-valent conjugate (PCV13) vaccine. One dose is recommended after age 57. Pneumococcal polysaccharide (PPSV23) vaccine. One dose is recommended after age 65. Talk to your health care provider about which screenings and vaccines you need and how often you need them. This information is not intended to replace advice given to you by your health care provider. Make sure you discuss any questions you have with your health care provider. Document Released: 08/14/2015 Document Revised: 04/06/2016 Document Reviewed: 05/19/2015 Elsevier Interactive Patient Education  2017 [employer].  Fall Prevention in the Home Falls can cause injuries. They can happen to people of all ages. There are many things you can do to make your home safe and to help prevent falls. What can I do on the outside of my home? Regularly fix the edges of walkways and driveways and fix any cracks. Remove anything that might make you trip as you walk through a door, such as a raised step or threshold. Trim any bushes or trees on the path to your home. Use bright outdoor lighting. Clear any walking paths of  anything that might make someone trip, such as rocks or tools. Regularly check to see if handrails are loose or broken. Make sure that both sides of any steps have handrails. Any raised decks and porches should have guardrails on the edges. Have any leaves, snow, or ice cleared regularly. Use sand or salt on walking paths during winter. Clean up any spills in your garage right away. This includes oil or grease spills. What can I do in the bathroom? Use night lights. Install grab bars by the toilet and in the tub and shower. Do not use towel bars as grab bars. Use non-skid mats or decals in the tub or shower. If you need to sit down in the shower, use a plastic, non-slip stool. Keep the floor dry. Clean up any water that spills on the floor as soon as it happens. Remove soap buildup in the tub or shower regularly. Attach bath mats securely with double-sided non-slip rug tape. Do not have throw rugs and other things on the floor that can make you trip. What can I do in the bedroom?  Use night lights. Make sure that you have a light by your bed that is easy to reach. Do not use any sheets or blankets that are too big for your bed. They should not hang down onto the floor. Have a firm chair that has side arms. You can use this for support while you get dressed. Do not have throw rugs and other things on the floor that can make you trip. What can I do in the kitchen? Clean up any spills right away. Avoid walking on wet floors. Keep items that you use a lot in easy-to-reach places. If you need to reach something above you, use a strong step stool that has a grab bar. Keep electrical cords out of the way. Do not use floor polish or wax that makes floors slippery. If you must use wax, use non-skid floor wax. Do not have throw rugs and other things on the floor that can make you trip. What can I do with my stairs? Do not leave any items on the stairs. Make sure that there are handrails on both  sides of the stairs and use them. Fix handrails that are broken or loose. Make sure that handrails are as long as the stairways. Check any carpeting to make sure that it is firmly attached to the stairs. Fix any carpet that is loose or worn. Avoid having throw rugs at the top or bottom of the stairs. If you do have throw rugs, attach them to the floor with carpet tape. Make sure that you have a light switch at the top of the stairs and the bottom of the stairs. If you do not have them, ask someone to add them for you. What else can I do to help prevent falls? Wear shoes that: Do not have high heels. Have rubber bottoms. Are comfortable and fit you well. Are closed at the toe. Do not wear sandals. If you use a stepladder: Make sure that it is fully opened. Do not climb a closed stepladder. Make sure that both sides of the stepladder are locked into place. Ask someone to hold it for you, if possible. Clearly mark and make sure that you can see: Any grab bars or handrails. First and last steps. Where the edge of each step is. Use tools that help you move around (mobility aids) if they are needed. These include: Canes. Walkers. Scooters. Crutches. Turn on the lights when you go into a dark area. Replace any light bulbs as soon as they burn out. Set up your furniture so you have a clear path. Avoid moving your furniture around. If any of your floors are uneven, fix them. If there are any pets around you, be aware of where they are. Review your medicines with your doctor. Some medicines can make you feel dizzy. This can increase your chance of falling. Ask your doctor what other things that you can do to help prevent falls. This information is not intended to replace advice given to you by your health care provider. Make sure you discuss any questions you have with your health care provider. Document Released: 05/14/2009 Document Revised: 12/24/2015 Document Reviewed: 08/22/2014 Elsevier  Interactive Patient Education  2017 [employer].

## 2023-01-13 NOTE — Progress Notes (Signed)
 I connected with  Benita Stabile on 01/13/23 by a audio enabled telemedicine application and verified that I am speaking with the correct person using two identifiers.  Patient Location: Home  Provider Location: Home Office  I discussed the limitations of evaluation and management by telemedicine. The patient expressed understanding and agreed to proceed.  Subjective:   Wendy Marshall is a 67 y.o. female who presents for Medicare Annual (Subsequent) preventive examination.  Review of Systems     Cardiac Risk Factors include: dyslipidemia;obesity (BMI >30kg/m2)     Objective:    Today's Vitals   01/13/23 1259 01/13/23 1300  Weight: 225 lb (102.1 kg)   Height: 5\' 4"  (1.626 m)   PainSc:  0-No pain   Body mass index is 38.62 kg/m.     01/13/2023    1:04 PM 10/17/2022   11:19 PM 10/10/2022   11:02 AM 08/22/2022    2:24 PM 07/27/2022    2:44 PM 01/19/2022   10:30 AM 01/04/2022    1:51 PM  Advanced Directives  Does Patient Have a Medical Advance Directive? No No Yes Yes Yes No;Yes Yes  Type of Best boy of [place];Living will Healthcare Power of [place];Living will  Healthcare Power of [place];Living will Healthcare Power of [place];Living will  Does patient want to make changes to medical advance directive?   No - Guardian declined No - Patient declined No - Patient declined Yes (ED - Information included in AVS)   Copy of Healthcare Power of Attorney in Chart?   No - copy requested No - copy requested  No - copy requested No - copy requested  Would patient like information on creating a medical advance directive? No - Patient declined          Current Medications (verified) Outpatient Encounter Medications as of 01/13/2023  Medication Sig   apixaban (ELIQUIS) 5 MG TABS tablet Take 1 tablet (5 mg total) by mouth 2 (two) times daily.   levothyroxine (SYNTHROID) 112 MCG tablet TAKE 1/2 TABLET BY MOUTH ON MONDAY, FRIDAY, AND A WHOLE TABLET ON  ALL OTHER DAYS (Patient taking differently: Take 112 mcg by mouth daily before breakfast. TAKE 1/2 TABLET BY MOUTH ON MONDAY , FRIDAY AND A WHOLE TABLET ON ALL OTHER DAYS)   rosuvastatin (CRESTOR) 10 MG tablet Take 1 tablet by mouth once daily   Coenzyme Q10 (COQ10 PO) Take by mouth.   Misc. Devices MISC Please provide patient with pair of compression stocking 15-20 mmHg Dx: Z61.096- DVT of left peroneal vein   Multiple Vitamin (MULTIVITAMIN) tablet Take 1 tablet by mouth daily.   Turmeric (QC TUMERIC COMPLEX PO) Take by mouth as needed.   No facility-administered encounter medications on file as of 01/13/2023.    Allergies (verified) Penicillins, Adhesive [tape], Contrast media [iodinated contrast media], Flexeril [cyclobenzaprine], Omnipaque [iohexol], Soma [carisoprodol], Metaxalone, Methocarbamol, Nsaids, Other, and Tizanidine   History: Past Medical History:  Diagnosis Date   Anemia    Arthritis    "all over"   Chondromalacia of knee, right 08/2017   Complication of anesthesia    a.) delayed emergence   Dental crowns present    x 2   Difficulty swallowing pills    since cervical fusion   Family history of adverse reaction to anesthesia    a.) delayed emergence in 1st degree relative (mother)   Fibromyalgia    GERD (gastroesophageal reflux disease)    Headache    x 3 days (08/18/2017)  High cholesterol    History of hiatal hernia    History of pulmonary embolus (PE) 03/09/2016   bilateral   Hyperlipidemia    Hypothyroidism    Limited joint range of motion (ROM)    cervical spine - s/p fusion   Long term current use of anticoagulant    a.) apixaban   Medial meniscus tear 08/2017   right knee   OSA on CPAP    PONV (postoperative nausea and vomiting)    happened years ago-nothing recently   Pulmonary embolism (HCC) 2019   Sleep apnea    a.) requires nocturnal PAP therapy   Past Surgical History:  Procedure Laterality Date   ANTERIOR CERVICAL DECOMP/DISCECTOMY  FUSION  07/18/2002   C5-6   ANTERIOR CERVICAL DECOMP/DISCECTOMY FUSION N/A 10/10/2022   Procedure: C4-5 AND C6-7 ANTERIOR CERVICAL DISCECTOMY AND FUSION;  Surgeon: Venetia Night, MD;  Location: [facility];  Service: Neurosurgery;  Laterality: N/A;   CARPAL TUNNEL RELEASE Bilateral 2004   CHONDROPLASTY Right 08/23/2017   Procedure: CHONDROPLASTY;  Surgeon: Gean Birchwood, MD;  Location: [facility];  Service: Orthopedics;  Laterality: Right;   EYE MUSCLE SURGERY Bilateral    KNEE ARTHROSCOPY Right 08/23/2017   Procedure: ARTHROSCOPY RIGHT KNEE REMOVAL OF LOOSE BODIES;  Surgeon: Gean Birchwood, MD;  Location: [facility];  Service: Orthopedics;  Laterality: Right;   KNEE ARTHROSCOPY WITH MEDIAL MENISECTOMY Right 08/23/2017   Procedure: KNEE ARTHROSCOPY WITH MEDIAL MENISECTOMY;  Surgeon: Gean Birchwood, MD;  Location: [facility];  Service: Orthopedics;  Laterality: Right;   KNEE SURGERY Right 03/2019   SHOULDER ARTHROSCOPY Left 06/11/2001   TONSILLECTOMY     age 80   TOTAL HIP ARTHROPLASTY Left 02/01/2016   Procedure: LEFT TOTAL HIP ARTHROPLASTY ANTERIOR APPROACH;  Surgeon: Durene Romans, MD;  Location: [facility];  Service: Orthopedics;  Laterality: Left;  Failed Spinal to LMA   TOTAL HIP ARTHROPLASTY Right 05/2019   TUBAL LIGATION     WISDOM TOOTH EXTRACTION     Family History  Problem Relation Age of Onset   Hypertension Mother    Anesthesia problems Mother        hard to wake up post-op   Brain cancer Father    Colon cancer Other    Heart attack Other    Throat cancer Other    Colon cancer Paternal Grandmother    Heart attack Maternal Grandfather    Throat cancer Maternal Grandfather    Social History   Socioeconomic History   Marital status: Married    Spouse name: Molly Maduro   Number of children: 2   Years of education: Not on file   Highest education level: High school graduate  Occupational History   Occupation: Systems developer    Comment:  retired  Tobacco Use   Smoking status: Former    Packs/day: 0.00    Years: 11.00    Additional pack years: 0.00    Total pack years: 0.00    Types: Cigarettes    Quit date: 07/31/1996    Years since quitting: 26.4   Smokeless tobacco: Never  Vaping Use   Vaping Use: Never used  Substance and Sexual Activity   Alcohol use: Not Currently   Drug use: No   Sexual activity: Yes    Birth control/protection: None, Post-menopausal, Surgical    Comment: tubal  Other Topics Concern   Not on file  Social History Narrative   Lives with spouse   Social Determinants of Health  Financial Resource Strain: Low Risk  (01/13/2023)   Overall Financial Resource Strain (CARDIA)    Difficulty of Paying Living Expenses: Not hard at all  Food Insecurity: No Food Insecurity (01/13/2023)   Hunger Vital Sign    Worried About Running Out of Food in the Last Year: Never true    Ran Out of Food in the Last Year: Never true  Transportation Needs: No Transportation Needs (01/13/2023)   PRAPARE - Administrator, Civil Service (Medical): No    Lack of Transportation (Non-Medical): No  Physical Activity: Sufficiently Active (01/13/2023)   Exercise Vital Sign    Days of Exercise per Week: 7 days    Minutes of Exercise per Session: 30 min  Stress: No Stress Concern Present (01/13/2023)   [employer] of Occupational Health - Occupational Stress Questionnaire    Feeling of Stress : Not at all  Social Connections: Socially Integrated (01/13/2023)   Social Connection and Isolation Panel [NHANES]    Frequency of Communication with Friends and Family: More than three times a week    Frequency of Social Gatherings with Friends and Family: More than three times a week    Attends Religious Services: More than 4 times per year    Active Member of [employer] or Organizations: Yes    Attends Engineer, structural: More than 4 times per year    Marital Status: Married    Tobacco  Counseling Counseling given: Yes   Clinical Intake:  Pre-visit preparation completed: Yes  Pain : No/denies pain Pain Score: 0-No pain     BMI - recorded: 38.62 Nutritional Risks: None Diabetes: No  How often do you need to have someone help you when you read instructions, pamphlets, or other written materials from your doctor or pharmacy?: 1 - Never  Diabetic?no  Interpreter Needed?: No  Information entered by ::  Shaterria Sager, CMA   Activities of Daily Living    01/13/2023    1:05 PM 10/10/2022    6:00 PM  In your present state of health, do you have any difficulty performing the following activities:  Hearing? 0 0  Vision? 0 0  Difficulty concentrating or making decisions? 0 0  Walking or climbing stairs? 0 0  Dressing or bathing? 0 0  Doing errands, shopping? 0 0  Preparing Food and eating ? N   Using the Toilet? N   In the past six months, have you accidently leaked urine? N   Do you have problems with loss of bowel control? N   Managing your Medications? N   Managing your Finances? N   Housekeeping or managing your Housekeeping? N     Patient Care Team: Babs Sciara, MD as PCP - General (Family Medicine)  Indicate any recent Medical Services you may have received from other than [facility] providers in the past year (date may be approximate).     Assessment:   This is a routine wellness examination for Lavetta.  Hearing/Vision screen Hearing Screening - Comments:: Patient denies any hearing difficulties.   Vision Screening - Comments:: Wears rx glasses - up to date with routine eye exams with  Dr.Cotter   Dietary issues and exercise activities discussed: Current Exercise Habits: Home exercise routine, Type of exercise: walking, Time (Minutes): 30, Frequency (Times/Week): 7, Weekly Exercise (Minutes/Week): 210, Intensity: Mild, Exercise limited by: None identified   Goals Addressed               This Visit's Progress  Patient Stated  (pt-stated)        Patients goal is to get her yard put in.        Depression Screen    01/13/2023    1:02 PM 09/29/2022    8:57 AM 01/04/2022    1:42 PM 11/17/2020    8:31 AM 07/06/2020    1:19 PM 09/10/2019    9:41 AM 08/27/2019   11:03 AM  PHQ 2/9 Scores  PHQ - 2 Score 0 2 1 0 2 0 1  PHQ- 9 Score  7   6      Fall Risk    01/13/2023    1:04 PM 09/29/2022    8:57 AM 01/04/2022    1:51 PM 11/17/2020    8:31 AM 09/10/2019    9:41 AM  Fall Risk   Falls in the past year? 0 0 0 0 0  Number falls in past yr: 0 0 0 0 0  Injury with Fall? 0 0 0 0 0  Risk for fall due to : No Fall Risks  No Fall Risks No Fall Risks   Follow up Falls prevention discussed  Falls prevention discussed Falls evaluation completed;Falls prevention discussed     FALL RISK PREVENTION PERTAINING TO THE HOME:  Any stairs in or around the home? No  If so, are there any without handrails? No  Home free of loose throw rugs in walkways, pet beds, electrical cords, etc? Yes  Adequate lighting in your home to reduce risk of falls? Yes   ASSISTIVE DEVICES UTILIZED TO PREVENT FALLS:  Life alert? No  Use of a cane, walker or w/c? No  Grab bars in the bathroom? No  Shower chair or bench in shower? Yes  Elevated toilet seat or a handicapped toilet? No   TIMED UP AND GO:  Was the test performed? No .   Cognitive Function:        01/13/2023    1:05 PM 01/04/2022    2:00 PM  6CIT Screen  What Year? 0 points 0 points  What month? 0 points 0 points  What time? 0 points 0 points  Count back from 20 0 points 0 points  Months in reverse 0 points 0 points  Repeat phrase 0 points 0 points  Total Score 0 points 0 points    Immunizations Immunization History  Administered Date(s) Administered   Influenza Inj Mdck Quad Pf 04/25/2019   Influenza,inj,Quad PF,6+ Mos 05/02/2015, 07/06/2020   Influenza-Unspecified 05/16/2016, 04/29/2019   PFIZER(Purple Top)SARS-COV-2 Vaccination 10/31/2019, 11/22/2019, 08/12/2020   Td  10/30/2006   Tdap 07/06/2020    TDAP status: Up to date  Flu Vaccine status: Up to date  Pneumococcal vaccine status: Not age appropriate for this patient  Covid-19 vaccine status: Information provided on how to obtain vaccines.   Qualifies for Shingles Vaccine? No   Zostavax completed No   Shingrix Completed?: No.    Education has been provided regarding the importance of this vaccine. Patient has been advised to call insurance company to determine out of pocket expense if they have not yet received this vaccine. Advised may also receive vaccine at local pharmacy or Health Dept. Verbalized acceptance and understanding.  Screening Tests Health Maintenance  Topic Date Due   Zoster Vaccines- Shingrix (1 of 2) Never done   COVID-19 Vaccine (4 - 2023-24 season) 04/01/2022   PAP SMEAR-Modifier  09/09/2022   INFLUENZA VACCINE  03/02/2023   MAMMOGRAM  05/21/2023   Medicare Annual Wellness (AWV)  01/13/2024   Colonoscopy  09/12/2024   DTaP/Tdap/Td (3 - Td or Tdap) 07/06/2030   Hepatitis C Screening  Completed   HIV Screening  Completed   HPV VACCINES  Aged Out    Health Maintenance  Health Maintenance Due  Topic Date Due   Zoster Vaccines- Shingrix (1 of 2) Never done   COVID-19 Vaccine (4 - 2023-24 season) 04/01/2022   PAP SMEAR-Modifier  09/09/2022    Colorectal cancer screening: Type of screening: Colonoscopy. Completed 09/12/2014. Repeat every 10 years  Mammogram status: Completed 05/20/2022. Repeat every year  Bone Density status:  Not age appropriate for this patient  Lung Cancer Screening: (Low Dose CT Chest recommended if Age 57-80 years, 30 pack-year currently smoking OR have quit w/in 15years.) does not qualify.   Additional Screening:  Hepatitis C Screening: does qualify; Completed 02/06/2019  Vision Screening: Recommended annual ophthalmology exams for early detection of glaucoma and other disorders of the eye. Is the patient up to date with their annual eye  exam?  Yes  Who is the provider or what is the name of the office in which the patient attends annual eye exams? Dr.Cotter  Dental Screening: Recommended annual dental exams for proper oral hygiene  Community Resource Referral / Chronic Care Management: CRR required this visit?  No   CCM required this visit?  No      Plan:     I have personally reviewed and noted the following in the patient's chart:   Medical and social history Use of alcohol, tobacco or illicit drugs  Current medications and supplements including opioid prescriptions. Patient is not currently taking opioid prescriptions. Functional ability and status Nutritional status Physical activity Advanced directives List of other physicians Hospitalizations, surgeries, and ER visits in previous 12 months Vitals Screenings to include cognitive, depression, and falls Referrals and appointments  In addition, I have reviewed and discussed with patient certain preventive protocols, quality metrics, and best practice recommendations. A written personalized care plan for preventive services as well as general preventive health recommendations were provided to patient.   Due to this being a telephonic visit, the after visit summary with patients personalized plan was offered to patient via mail or my-chart. Patient would like to access their AVS via my-chart   Due to this visit being a telehealth visit, certain criteria was not obtained, such a blood pressure, CBG if patient is a diabetic, and timed up and go.    Jordan Hawks Zaryan Yakubov, CMA   01/13/2023   Nurse Notes: patient advised to get vaccines at her preferred pharmacy.

## 2023-02-25 ENCOUNTER — Other Ambulatory Visit: Payer: Self-pay | Admitting: Family Medicine

## 2023-02-28 ENCOUNTER — Other Ambulatory Visit: Payer: Self-pay | Admitting: Family Medicine

## 2023-03-15 ENCOUNTER — Other Ambulatory Visit: Payer: Self-pay

## 2023-03-15 DIAGNOSIS — M5412 Radiculopathy, cervical region: Secondary | ICD-10-CM

## 2023-03-15 DIAGNOSIS — M4802 Spinal stenosis, cervical region: Secondary | ICD-10-CM

## 2023-03-23 ENCOUNTER — Ambulatory Visit
Admission: RE | Admit: 2023-03-23 | Discharge: 2023-03-23 | Disposition: A | Discharge status: Home / Self Care | Payer: Medicare HMO | Attending: Neurosurgery | Admitting: Neurosurgery

## 2023-03-23 ENCOUNTER — Encounter: Payer: Self-pay | Admitting: Neurosurgery

## 2023-03-23 ENCOUNTER — Ambulatory Visit: Payer: Medicare HMO | Admitting: Neurosurgery

## 2023-03-23 ENCOUNTER — Ambulatory Visit
Admission: RE | Admit: 2023-03-23 | Discharge: 2023-03-23 | Disposition: A | Discharge status: Home / Self Care | Payer: Medicare HMO | Source: Ambulatory Visit | Attending: Neurosurgery | Admitting: Neurosurgery

## 2023-03-23 VITALS — BP 128/82 | Ht 64.0 in | Wt 225.0 lb

## 2023-03-23 DIAGNOSIS — M5412 Radiculopathy, cervical region: Secondary | ICD-10-CM | POA: Insufficient documentation

## 2023-03-23 DIAGNOSIS — Z09 Encounter for follow-up examination after completed treatment for conditions other than malignant neoplasm: Secondary | ICD-10-CM | POA: Diagnosis not present

## 2023-03-23 DIAGNOSIS — M4802 Spinal stenosis, cervical region: Secondary | ICD-10-CM | POA: Diagnosis not present

## 2023-03-23 DIAGNOSIS — M5414 Radiculopathy, thoracic region: Secondary | ICD-10-CM

## 2023-03-23 DIAGNOSIS — M546 Pain in thoracic spine: Secondary | ICD-10-CM | POA: Diagnosis not present

## 2023-03-23 DIAGNOSIS — G8929 Other chronic pain: Secondary | ICD-10-CM | POA: Diagnosis not present

## 2023-03-23 DIAGNOSIS — Z981 Arthrodesis status: Secondary | ICD-10-CM | POA: Diagnosis not present

## 2023-03-23 DIAGNOSIS — M5030 Other cervical disc degeneration, unspecified cervical region: Secondary | ICD-10-CM | POA: Diagnosis not present

## 2023-03-23 NOTE — Progress Notes (Signed)
   REFERRING PHYSICIAN:  No referring provider defined for this encounter.  DOS: 10/10/22  ACDF C4-C5, removal of C5-C6 plate, ACDF N5-A2  HISTORY OF PRESENT ILLNESS:  Wendy Marshall is doing very well from her neck surgery.  She has had great improvement in her symptoms.  She is having some pain in between her shoulder blades prickly when she sneezes.  She has some pain that extends into her rib cage on the right side.      PHYSICAL EXAMINATION:  NEUROLOGICAL:  General: In no acute distress.   Awake, alert, oriented to person, place, and time.  Pupils equal round and reactive to light.  Facial tone is symmetric.    Strength: Side Biceps Triceps Deltoid Interossei Grip Wrist Ext. Wrist Flex.  R 5 5 5 5 5 5 5   L 5 5 5 5 5 5 5    Incision well healed.   Imaging:  No complications noted.    Assessment / Plan: Wendy Marshall is doing well s/p above surgery.  She unfortunately is having some thoracic back pain that may be secondary to radiculopathy.  We discussed physical therapy, chiropractic referral, or discussion with Dr. Clydene Fake.  I have recommended that she talk with Dr. Clydene Fake to see if there are any options for thoracic injections that may help.  She could possibly have a thoracic radiculopathy, though those are uncommon.  Will see her back in clinic in 6 months or sooner if needed for her thoracic issues.    Venetia Night Dept of Neurosurgery

## 2023-03-31 ENCOUNTER — Encounter: Payer: Self-pay | Admitting: Neurosurgery

## 2023-04-04 DIAGNOSIS — M5134 Other intervertebral disc degeneration, thoracic region: Secondary | ICD-10-CM | POA: Diagnosis not present

## 2023-04-04 DIAGNOSIS — M4802 Spinal stenosis, cervical region: Secondary | ICD-10-CM | POA: Diagnosis not present

## 2023-04-04 DIAGNOSIS — M5414 Radiculopathy, thoracic region: Secondary | ICD-10-CM | POA: Diagnosis not present

## 2023-04-04 DIAGNOSIS — M5412 Radiculopathy, cervical region: Secondary | ICD-10-CM | POA: Diagnosis not present

## 2023-04-05 ENCOUNTER — Other Ambulatory Visit: Payer: Self-pay | Admitting: Family Medicine

## 2023-04-05 DIAGNOSIS — M5414 Radiculopathy, thoracic region: Secondary | ICD-10-CM

## 2023-04-17 ENCOUNTER — Telehealth: Payer: Self-pay | Admitting: Physician Assistant

## 2023-04-17 NOTE — Telephone Encounter (Signed)
Pt has been approved for pt assistance by [facility] for [facility]   CASE # [id number] 04/14/23*08/01/23  [phone] P [id number] F

## 2023-04-22 ENCOUNTER — Ambulatory Visit
Admission: RE | Admit: 2023-04-22 | Discharge: 2023-04-22 | Disposition: A | Discharge status: Home / Self Care | Payer: Medicare HMO | Source: Ambulatory Visit | Attending: Family Medicine | Admitting: Family Medicine

## 2023-04-22 DIAGNOSIS — M549 Dorsalgia, unspecified: Secondary | ICD-10-CM | POA: Diagnosis not present

## 2023-04-22 DIAGNOSIS — M5414 Radiculopathy, thoracic region: Secondary | ICD-10-CM

## 2023-05-20 ENCOUNTER — Other Ambulatory Visit: Payer: Self-pay | Admitting: Family Medicine

## 2023-05-22 DIAGNOSIS — M7918 Myalgia, other site: Secondary | ICD-10-CM | POA: Diagnosis not present

## 2023-05-22 DIAGNOSIS — M5412 Radiculopathy, cervical region: Secondary | ICD-10-CM | POA: Diagnosis not present

## 2023-05-22 DIAGNOSIS — M5414 Radiculopathy, thoracic region: Secondary | ICD-10-CM | POA: Diagnosis not present

## 2023-05-22 DIAGNOSIS — M4802 Spinal stenosis, cervical region: Secondary | ICD-10-CM | POA: Diagnosis not present

## 2023-06-13 DIAGNOSIS — H5203 Hypermetropia, bilateral: Secondary | ICD-10-CM | POA: Diagnosis not present

## 2023-06-13 DIAGNOSIS — M25561 Pain in right knee: Secondary | ICD-10-CM | POA: Diagnosis not present

## 2023-07-20 DIAGNOSIS — M1711 Unilateral primary osteoarthritis, right knee: Secondary | ICD-10-CM | POA: Diagnosis not present

## 2023-07-29 ENCOUNTER — Other Ambulatory Visit: Payer: Self-pay | Admitting: Family Medicine

## 2023-08-14 ENCOUNTER — Other Ambulatory Visit: Payer: Self-pay | Admitting: Family Medicine

## 2023-08-14 ENCOUNTER — Inpatient Hospital Stay: Payer: Medicare HMO | Attending: Hematology

## 2023-08-14 DIAGNOSIS — Z86718 Personal history of other venous thrombosis and embolism: Secondary | ICD-10-CM | POA: Insufficient documentation

## 2023-08-14 DIAGNOSIS — R002 Palpitations: Secondary | ICD-10-CM | POA: Insufficient documentation

## 2023-08-14 DIAGNOSIS — Z800 Family history of malignant neoplasm of digestive organs: Secondary | ICD-10-CM | POA: Diagnosis not present

## 2023-08-14 DIAGNOSIS — Z96642 Presence of left artificial hip joint: Secondary | ICD-10-CM | POA: Insufficient documentation

## 2023-08-14 DIAGNOSIS — Z7901 Long term (current) use of anticoagulants: Secondary | ICD-10-CM | POA: Diagnosis not present

## 2023-08-14 DIAGNOSIS — Z8249 Family history of ischemic heart disease and other diseases of the circulatory system: Secondary | ICD-10-CM | POA: Insufficient documentation

## 2023-08-14 DIAGNOSIS — Z8049 Family history of malignant neoplasm of other genital organs: Secondary | ICD-10-CM | POA: Insufficient documentation

## 2023-08-14 DIAGNOSIS — Z5986 Financial insecurity: Secondary | ICD-10-CM | POA: Diagnosis not present

## 2023-08-14 DIAGNOSIS — I82452 Acute embolism and thrombosis of left peroneal vein: Secondary | ICD-10-CM | POA: Diagnosis not present

## 2023-08-14 DIAGNOSIS — Z820 Family history of epilepsy and other diseases of the nervous system: Secondary | ICD-10-CM | POA: Insufficient documentation

## 2023-08-14 DIAGNOSIS — Z79899 Other long term (current) drug therapy: Secondary | ICD-10-CM | POA: Diagnosis not present

## 2023-08-14 LAB — COMPREHENSIVE METABOLIC PANEL
ALT: 13 U/L (ref 0–44)
AST: 17 U/L (ref 15–41)
Albumin: 3.9 g/dL (ref 3.5–5.0)
Alkaline Phosphatase: 65 U/L (ref 38–126)
Anion gap: 6 (ref 5–15)
BUN: 12 mg/dL (ref 8–23)
CO2: 24 mmol/L (ref 22–32)
Calcium: 8.8 mg/dL — ABNORMAL LOW (ref 8.9–10.3)
Chloride: 107 mmol/L (ref 98–111)
Creatinine, Ser: 0.91 mg/dL (ref 0.44–1.00)
GFR, Estimated: >60 mL/min (ref >60)
Glucose, Bld: 128 mg/dL — ABNORMAL HIGH (ref 70–99)
Potassium: 4 mmol/L (ref 3.5–5.1)
Sodium: 137 mmol/L (ref 135–145)
Total Bilirubin: 0.6 mg/dL (ref 0.0–1.2)
Total Protein: 7 g/dL (ref 6.5–8.1)

## 2023-08-14 LAB — CBC WITH DIFFERENTIAL/PLATELET
Abs Immature Granulocytes: 0.02 10*3/uL (ref 0.00–0.07)
Basophils Absolute: 0.1 10*3/uL (ref 0.0–0.1)
Basophils Relative: 1 %
Eosinophils Absolute: 0.1 10*3/uL (ref 0.0–0.5)
Eosinophils Relative: 2 %
HCT: 44.5 % (ref 36.0–46.0)
Hemoglobin: 13.7 g/dL (ref 12.0–15.0)
Immature Granulocytes: 0 %
Lymphocytes Relative: 39 %
Lymphs Abs: 2.3 10*3/uL (ref 0.7–4.0)
MCH: 25.7 pg — ABNORMAL LOW (ref 26.0–34.0)
MCHC: 30.8 g/dL (ref 30.0–36.0)
MCV: 83.5 fL (ref 80.0–100.0)
Monocytes Absolute: 0.4 10*3/uL (ref 0.1–1.0)
Monocytes Relative: 6 %
Neutro Abs: 3.1 10*3/uL (ref 1.7–7.7)
Neutrophils Relative %: 52 %
Platelets: 252 10*3/uL (ref 150–400)
RBC: 5.33 MIL/uL — ABNORMAL HIGH (ref 3.87–5.11)
RDW: 14.3 % (ref 11.5–15.5)
WBC: 5.9 10*3/uL (ref 4.0–10.5)
nRBC: 0 % (ref 0.0–0.2)

## 2023-08-14 LAB — D-DIMER, QUANTITATIVE: D-Dimer, Quant: 0.31 ug{FEU}/mL (ref 0.00–0.50)

## 2023-08-16 NOTE — Progress Notes (Signed)
VIRTUAL VISIT via TELEPHONE NOTE [facility]   I connected with Wendy Marshall  on 08/21/23 at 2:25 PM by telephone and verified that I am speaking with the correct person using two identifiers.  Location: Patient: Home Provider: [facility]   I discussed the limitations, risks, security and privacy concerns of performing an evaluation and management service by telephone and the availability of in person appointments. I also discussed with the patient that there may be a patient responsible charge related to this service. The patient expressed understanding and agreed to proceed.  REASON FOR VISIT:  Follow-up for recurrent thromboembolism  PRIOR THERAPY: None  CURRENT THERAPY: Eliquis 5 mg twice daily  INTERVAL HISTORY:   Wendy Marshall 68 y.o. female returns for routine follow-up of her recurrent thromboembolisms.  She was last seen by Rojelio Brenner PA-C on 08/22/2022.  Since last visit, she reports having neck fusion surgery in March 2024.  She reports bridging with Lovenox postoperatively before resuming Eliquis.  No issues reported.  At today's visit, she reports feeling ill due to acute "head cold."   She continues to take Eliquis twice daily and is tolerating these well without any signs of major bleeding events.  She has not had any interval DVT or PE since her last visit.   She denies any unilateral leg swelling, pain, and erythema.  No new shortness of breath, dyspnea on exertion, chest pain, cough, and hemoptysis.  She has occasional palpitations, which is chronic.  She denies any falls this year  She has 100% energy and 100% appetite. She endorses that she is maintaining a stable weight.  ASSESSMENT & PLAN:  1.  Recurrent thromboembolism: - Bilateral PE on 03/09/2016 after left hip replacement.  Dopplers were negative for DVT.  She was treated with Eliquis for a year. - Doppler on 11/08/2018 shows occlusive DVT involving left peroneal vein,  new compared to Doppler from July 2017.  No extension to more proximal venous system.  This second episode is unprovoked. - Patient has been taking Eliquis since April 2020.  Tolerating well without any bleeding problems.   - She has post thrombotic changes of her left leg, which was about an inch bigger in diameter compared to right leg (per office visit from 1 20-24), likely from prior DVT.  She was encouraged to wear compression socks (15-20 mmHg). - We have reviewed her labs from 08/14/2023 which showed normal LFTs and CBC.  D-dimer was normal at 0.31.  Hgb 13.7/MCV 83.5. - She does not have any pleuritic chest pains or worsening shortness of breath. - She denies any falls this year  - PLAN: Benefits of indefinite anticoagulation continue to outweigh the risks.  RTC in 1 year. - She does report some financial strain from cost of Eliquis (co-pay increased to $155 per month this year).  She is currently receiving financial assistance from [employer], but we did discuss that if she requires alternative options in the future she could consider Xarelto, Lovenox, or warfarin.  2.  Family history: - Mother had brain tumor.  Maternal grandmother had cervical cancer.  Paternal grandmother had colon cancer. - Maternal uncle had unprovoked DVT x2 in his 92s. -- Paternal great-grandmother deceased due to pulmonary embolism in her 54s  PLAN SUMMARY: >> Same-day labs (CBC/D, CMP, D-dimer) + OFFICE visit in 1 year     REVIEW OF SYSTEMS:   Review of Systems  Constitutional:  Negative for chills, diaphoresis, fever, malaise/fatigue and  weight loss.  Respiratory:  Positive for cough. Negative for shortness of breath.   Cardiovascular:  Positive for palpitations. Negative for chest pain.  Gastrointestinal:  Negative for abdominal pain, blood in stool, melena, nausea and vomiting.  Neurological:  Positive for tingling. Negative for dizziness and headaches.     PHYSICAL EXAM: (per limitations of  virtual telephone visit)  The patient is alert and oriented x 3, exhibiting adequate mentation, good mood, and ability to speak in full sentences and execute sound judgement.  WRAP UP:   I discussed the assessment and treatment plan with the patient. The patient was provided an opportunity to ask questions and all were answered. The patient agreed with the plan and demonstrated an understanding of the instructions.   The patient was advised to call back or seek an in-person evaluation if the symptoms worsen or if the condition fails to improve as anticipated.  I provided 18 minutes of non-face-to-face time during this encounter, including >10 minutes of medical discussion.  Carnella Guadalajara, PA-C 08/21/23 5:09 PM

## 2023-08-21 ENCOUNTER — Inpatient Hospital Stay (HOSPITAL_BASED_OUTPATIENT_CLINIC_OR_DEPARTMENT_OTHER): Payer: Medicare HMO | Admitting: Physician Assistant

## 2023-08-21 DIAGNOSIS — Z7901 Long term (current) use of anticoagulants: Secondary | ICD-10-CM

## 2023-08-21 DIAGNOSIS — I82452 Acute embolism and thrombosis of left peroneal vein: Secondary | ICD-10-CM

## 2023-08-21 MED ORDER — APIXABAN 5 MG PO TABS: 5.0000 mg | ORAL_TABLET | Freq: Two times a day (BID) | ORAL | 11 refills | Status: DC

## 2023-08-28 DIAGNOSIS — M25551 Pain in right hip: Secondary | ICD-10-CM | POA: Diagnosis not present

## 2023-09-13 DIAGNOSIS — M1711 Unilateral primary osteoarthritis, right knee: Secondary | ICD-10-CM | POA: Diagnosis not present

## 2023-09-15 DIAGNOSIS — M549 Dorsalgia, unspecified: Secondary | ICD-10-CM | POA: Diagnosis not present

## 2023-09-15 DIAGNOSIS — S93402A Sprain of unspecified ligament of left ankle, initial encounter: Secondary | ICD-10-CM | POA: Diagnosis not present

## 2023-09-15 DIAGNOSIS — I10 Essential (primary) hypertension: Secondary | ICD-10-CM | POA: Diagnosis not present

## 2023-09-15 DIAGNOSIS — M62830 Muscle spasm of back: Secondary | ICD-10-CM | POA: Diagnosis not present

## 2023-09-16 ENCOUNTER — Encounter (HOSPITAL_COMMUNITY): Payer: Self-pay

## 2023-09-16 ENCOUNTER — Emergency Department (HOSPITAL_COMMUNITY)
Admission: EM | Admit: 2023-09-16 | Discharge: 2023-09-16 | Disposition: A | Discharge status: Home / Self Care | Payer: Medicare HMO | Attending: Emergency Medicine | Admitting: Emergency Medicine

## 2023-09-16 ENCOUNTER — Other Ambulatory Visit: Payer: Self-pay

## 2023-09-16 DIAGNOSIS — R519 Headache, unspecified: Secondary | ICD-10-CM | POA: Insufficient documentation

## 2023-09-16 DIAGNOSIS — M25552 Pain in left hip: Secondary | ICD-10-CM | POA: Diagnosis not present

## 2023-09-16 DIAGNOSIS — Z7901 Long term (current) use of anticoagulants: Secondary | ICD-10-CM | POA: Diagnosis not present

## 2023-09-16 DIAGNOSIS — Y92410 Unspecified street and highway as the place of occurrence of the external cause: Secondary | ICD-10-CM | POA: Insufficient documentation

## 2023-09-16 DIAGNOSIS — S300XXD Contusion of lower back and pelvis, subsequent encounter: Secondary | ICD-10-CM | POA: Diagnosis not present

## 2023-09-16 DIAGNOSIS — E039 Hypothyroidism, unspecified: Secondary | ICD-10-CM | POA: Insufficient documentation

## 2023-09-16 DIAGNOSIS — M542 Cervicalgia: Secondary | ICD-10-CM | POA: Diagnosis not present

## 2023-09-16 DIAGNOSIS — Z96642 Presence of left artificial hip joint: Secondary | ICD-10-CM | POA: Insufficient documentation

## 2023-09-16 DIAGNOSIS — M545 Low back pain, unspecified: Secondary | ICD-10-CM | POA: Diagnosis not present

## 2023-09-16 DIAGNOSIS — S93402A Sprain of unspecified ligament of left ankle, initial encounter: Secondary | ICD-10-CM | POA: Diagnosis not present

## 2023-09-16 DIAGNOSIS — M62830 Muscle spasm of back: Secondary | ICD-10-CM | POA: Diagnosis not present

## 2023-09-16 LAB — COMPREHENSIVE METABOLIC PANEL
ALT: 16 U/L (ref 0–44)
AST: 26 U/L (ref 15–41)
Albumin: 4.2 g/dL (ref 3.5–5.0)
Alkaline Phosphatase: 65 U/L (ref 38–126)
Anion gap: 12 (ref 5–15)
BUN: 15 mg/dL (ref 8–23)
CO2: 22 mmol/L (ref 22–32)
Calcium: 9.1 mg/dL (ref 8.9–10.3)
Chloride: 105 mmol/L (ref 98–111)
Creatinine, Ser: 1.13 mg/dL — ABNORMAL HIGH (ref 0.44–1.00)
GFR, Estimated: 54 mL/min — ABNORMAL LOW (ref >60)
Glucose, Bld: 104 mg/dL — ABNORMAL HIGH (ref 70–99)
Potassium: 3.6 mmol/L (ref 3.5–5.1)
Sodium: 139 mmol/L (ref 135–145)
Total Bilirubin: 0.9 mg/dL (ref 0.0–1.2)
Total Protein: 7.8 g/dL (ref 6.5–8.1)

## 2023-09-16 LAB — LACTIC ACID, PLASMA: Lactic Acid, Venous: 1 mmol/L (ref 0.5–1.9)

## 2023-09-16 LAB — TYPE AND SCREEN
ABO/RH(D): A NEG
Antibody Screen: NEGATIVE

## 2023-09-16 LAB — CBC
HCT: 42.3 % (ref 36.0–46.0)
Hemoglobin: 12.9 g/dL (ref 12.0–15.0)
MCH: 25.7 pg — ABNORMAL LOW (ref 26.0–34.0)
MCHC: 30.5 g/dL (ref 30.0–36.0)
MCV: 84.4 fL (ref 80.0–100.0)
Platelets: 243 10*3/uL (ref 150–400)
RBC: 5.01 MIL/uL (ref 3.87–5.11)
RDW: 14.1 % (ref 11.5–15.5)
WBC: 8.8 10*3/uL (ref 4.0–10.5)
nRBC: 0 % (ref 0.0–0.2)

## 2023-09-16 LAB — PROTIME-INR
INR: 1.1 (ref 0.8–1.2)
Prothrombin Time: 14.3 s (ref 11.4–15.2)

## 2023-09-16 LAB — LIPASE, BLOOD: Lipase: 41 U/L (ref 11–51)

## 2023-09-16 MED ORDER — SODIUM CHLORIDE 0.9 % IV BOLUS
1000.0000 mL | Freq: Once | INTRAVENOUS | Status: AC
  Administered 2023-09-16: 1000 mL via INTRAVENOUS

## 2023-09-16 MED ORDER — ONDANSETRON HCL 4 MG/2ML IJ SOLN
4.0000 mg | Freq: Once | INTRAMUSCULAR | Status: AC
  Administered 2023-09-16: 4 mg via INTRAVENOUS
  Filled 2023-09-16: qty 2

## 2023-09-16 MED ORDER — HYDROMORPHONE HCL 1 MG/ML IJ SOLN
0.5000 mg | Freq: Once | INTRAMUSCULAR | Status: AC
  Administered 2023-09-16: 0.5 mg via INTRAVENOUS
  Filled 2023-09-16: qty 0.5

## 2023-09-16 MED ORDER — ONDANSETRON HCL 4 MG PO TABS: 4.0000 mg | ORAL_TABLET | Freq: Four times a day (QID) | ORAL | 0 refills | Status: DC | PRN

## 2023-09-16 NOTE — Discharge Instructions (Addendum)
It was a pleasure taking part in your care.  As we discussed, your lab work is reassuring.  Your hemoglobin is stable.  I reached out to [facility] ED and discussed the imaging findings that you had performed yesterday and they were all without acute findings.  Please continue taking pain medication at home, please pick up pain medication sent to your pharmacy on file.  Please pick up Zofran sent to your pharmacy on file for nausea and take this every 6 hours as needed.  Please continue hydrating yourself at home.  Please follow-up with your PCP.  Return to the ED with any new or worsening signs or symptoms.
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
## 2023-09-18 ENCOUNTER — Ambulatory Visit (INDEPENDENT_AMBULATORY_CARE_PROVIDER_SITE_OTHER): Payer: Medicare HMO | Admitting: Nurse Practitioner

## 2023-09-18 ENCOUNTER — Ambulatory Visit: Payer: Self-pay | Admitting: Family Medicine

## 2023-09-18 DIAGNOSIS — T148XXA Other injury of unspecified body region, initial encounter: Secondary | ICD-10-CM | POA: Insufficient documentation

## 2023-09-18 DIAGNOSIS — S79922A Unspecified injury of left thigh, initial encounter: Secondary | ICD-10-CM

## 2023-09-18 DIAGNOSIS — S90512D Abrasion, left ankle, subsequent encounter: Secondary | ICD-10-CM

## 2023-09-18 MED ORDER — HYDROCODONE-ACETAMINOPHEN 10-325 MG PO TABS: ORAL_TABLET | ORAL | 0 refills | Status: DC

## 2023-09-18 NOTE — Telephone Encounter (Signed)
Patient scheduled office visit 09/19/23 with Eber Jones NP
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
## 2023-09-19 ENCOUNTER — Ambulatory Visit: Payer: Medicare HMO | Admitting: Nurse Practitioner

## 2023-09-20 ENCOUNTER — Encounter: Payer: Self-pay | Admitting: Nurse Practitioner

## 2023-09-20 DIAGNOSIS — S90512D Abrasion, left ankle, subsequent encounter: Secondary | ICD-10-CM | POA: Insufficient documentation
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
## 2023-09-21 ENCOUNTER — Ambulatory Visit: Payer: Medicare HMO | Admitting: Neurosurgery

## 2023-09-26 ENCOUNTER — Ambulatory Visit (INDEPENDENT_AMBULATORY_CARE_PROVIDER_SITE_OTHER): Payer: Medicare HMO | Admitting: Neurosurgery

## 2023-09-26 ENCOUNTER — Ambulatory Visit
Admission: RE | Admit: 2023-09-26 | Discharge: 2023-09-26 | Disposition: A | Discharge status: Home / Self Care | Payer: Medicare HMO | Source: Ambulatory Visit | Attending: Neurosurgery | Admitting: Neurosurgery

## 2023-09-26 ENCOUNTER — Encounter: Payer: Self-pay | Admitting: Neurosurgery

## 2023-09-26 ENCOUNTER — Other Ambulatory Visit: Payer: Self-pay

## 2023-09-26 VITALS — BP 136/88 | Ht 64.0 in | Wt 228.0 lb

## 2023-09-26 DIAGNOSIS — R200 Anesthesia of skin: Secondary | ICD-10-CM | POA: Diagnosis not present

## 2023-09-26 DIAGNOSIS — M5412 Radiculopathy, cervical region: Secondary | ICD-10-CM | POA: Diagnosis not present

## 2023-09-26 DIAGNOSIS — Z4789 Encounter for other orthopedic aftercare: Secondary | ICD-10-CM | POA: Diagnosis not present

## 2023-09-26 DIAGNOSIS — M47812 Spondylosis without myelopathy or radiculopathy, cervical region: Secondary | ICD-10-CM | POA: Diagnosis not present

## 2023-09-26 DIAGNOSIS — M4802 Spinal stenosis, cervical region: Secondary | ICD-10-CM | POA: Diagnosis not present

## 2023-09-26 DIAGNOSIS — Z981 Arthrodesis status: Secondary | ICD-10-CM | POA: Diagnosis not present
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
## 2023-10-02 ENCOUNTER — Ambulatory Visit (INDEPENDENT_AMBULATORY_CARE_PROVIDER_SITE_OTHER): Payer: Medicare HMO | Admitting: Nurse Practitioner

## 2023-10-02 DIAGNOSIS — S8992XD Unspecified injury of left lower leg, subsequent encounter: Secondary | ICD-10-CM | POA: Diagnosis not present

## 2023-10-02 DIAGNOSIS — T148XXA Other injury of unspecified body region, initial encounter: Secondary | ICD-10-CM

## 2023-10-02 DIAGNOSIS — E785 Hyperlipidemia, unspecified: Secondary | ICD-10-CM

## 2023-10-02 DIAGNOSIS — R7301 Impaired fasting glucose: Secondary | ICD-10-CM

## 2023-10-02 DIAGNOSIS — E039 Hypothyroidism, unspecified: Secondary | ICD-10-CM | POA: Diagnosis not present

## 2023-10-02 MED ORDER — AMITRIPTYLINE HCL 10 MG PO TABS: 10.0000 mg | ORAL_TABLET | Freq: Every day | ORAL | 0 refills | Status: DC

## 2023-10-03 ENCOUNTER — Encounter: Payer: Self-pay | Admitting: Nurse Practitioner
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
## 2023-10-11 ENCOUNTER — Other Ambulatory Visit: Payer: Self-pay | Admitting: Nurse Practitioner

## 2023-10-11 ENCOUNTER — Encounter: Payer: Self-pay | Admitting: Nurse Practitioner

## 2023-10-11 MED ORDER — AMITRIPTYLINE HCL 25 MG PO TABS: 25.0000 mg | ORAL_TABLET | Freq: Every day | ORAL | 0 refills | Status: DC

## 2023-10-12 DIAGNOSIS — Z96643 Presence of artificial hip joint, bilateral: Secondary | ICD-10-CM | POA: Diagnosis not present

## 2023-10-12 DIAGNOSIS — M1711 Unilateral primary osteoarthritis, right knee: Secondary | ICD-10-CM | POA: Diagnosis not present

## 2023-10-19 DIAGNOSIS — M1711 Unilateral primary osteoarthritis, right knee: Secondary | ICD-10-CM | POA: Diagnosis not present

## 2023-10-26 DIAGNOSIS — M1711 Unilateral primary osteoarthritis, right knee: Secondary | ICD-10-CM | POA: Diagnosis not present

## 2023-10-30 DIAGNOSIS — M545 Low back pain, unspecified: Secondary | ICD-10-CM | POA: Diagnosis not present

## 2023-10-31 DIAGNOSIS — R7301 Impaired fasting glucose: Secondary | ICD-10-CM | POA: Diagnosis not present

## 2023-10-31 DIAGNOSIS — E785 Hyperlipidemia, unspecified: Secondary | ICD-10-CM | POA: Diagnosis not present

## 2023-10-31 DIAGNOSIS — E039 Hypothyroidism, unspecified: Secondary | ICD-10-CM | POA: Diagnosis not present

## 2023-11-01 ENCOUNTER — Other Ambulatory Visit: Payer: Self-pay | Admitting: Family Medicine

## 2023-11-01 LAB — TSH: TSH: 1.46 u[IU]/mL (ref 0.450–4.500)

## 2023-11-01 LAB — LIPID PANEL
Chol/HDL Ratio: 2.3 ratio (ref 0.0–4.4)
Cholesterol, Total: 156 mg/dL (ref 100–199)
HDL: 67 mg/dL (ref >39)
LDL Chol Calc (NIH): 74 mg/dL (ref 0–99)
Triglycerides: 82 mg/dL (ref 0–149)
VLDL Cholesterol Cal: 15 mg/dL (ref 5–40)

## 2023-11-01 LAB — HEMOGLOBIN A1C
Est. average glucose Bld gHb Est-mCnc: 123 mg/dL
Hgb A1c MFr Bld: 5.9 % — ABNORMAL HIGH (ref 4.8–5.6)

## 2023-11-02 ENCOUNTER — Encounter: Payer: Self-pay | Admitting: Nurse Practitioner

## 2023-11-09 ENCOUNTER — Other Ambulatory Visit: Payer: Self-pay | Admitting: Family Medicine

## 2023-11-09 ENCOUNTER — Other Ambulatory Visit: Payer: Self-pay

## 2023-11-09 MED ORDER — LEVOTHYROXINE SODIUM 112 MCG PO TABS: ORAL_TABLET | ORAL | 0 refills | Status: DC

## 2023-11-22 DIAGNOSIS — M5416 Radiculopathy, lumbar region: Secondary | ICD-10-CM | POA: Diagnosis not present

## 2023-11-29 DIAGNOSIS — M5416 Radiculopathy, lumbar region: Secondary | ICD-10-CM | POA: Diagnosis not present

## 2023-12-15 DIAGNOSIS — M5416 Radiculopathy, lumbar region: Secondary | ICD-10-CM | POA: Diagnosis not present

## 2023-12-18 DIAGNOSIS — M5416 Radiculopathy, lumbar region: Secondary | ICD-10-CM | POA: Diagnosis not present

## 2023-12-29 DIAGNOSIS — M5416 Radiculopathy, lumbar region: Secondary | ICD-10-CM | POA: Diagnosis not present

## 2024-01-02 DIAGNOSIS — M5416 Radiculopathy, lumbar region: Secondary | ICD-10-CM | POA: Diagnosis not present

## 2024-01-05 ENCOUNTER — Other Ambulatory Visit: Payer: Self-pay | Admitting: Nurse Practitioner

## 2024-01-18 DIAGNOSIS — M5416 Radiculopathy, lumbar region: Secondary | ICD-10-CM | POA: Diagnosis not present

## 2024-01-19 ENCOUNTER — Ambulatory Visit: Payer: Medicare HMO

## 2024-01-24 ENCOUNTER — Other Ambulatory Visit: Payer: Self-pay | Admitting: Family Medicine

## 2024-01-31 ENCOUNTER — Other Ambulatory Visit: Payer: Self-pay | Admitting: Family Medicine

## 2024-02-01 ENCOUNTER — Telehealth: Payer: Self-pay | Admitting: Oncology

## 2024-02-01 NOTE — Telephone Encounter (Signed)
 Faxed assist application to [email or web address]   Pending review Copy in file cabinet

## 2024-02-09 ENCOUNTER — Ambulatory Visit (INDEPENDENT_AMBULATORY_CARE_PROVIDER_SITE_OTHER)

## 2024-02-09 VITALS — Ht 64.0 in | Wt 231.0 lb

## 2024-02-09 DIAGNOSIS — Z0000 Encounter for general adult medical examination without abnormal findings: Secondary | ICD-10-CM

## 2024-02-09 DIAGNOSIS — Z780 Asymptomatic menopausal state: Secondary | ICD-10-CM

## 2024-02-09 DIAGNOSIS — Z1231 Encounter for screening mammogram for malignant neoplasm of breast: Secondary | ICD-10-CM

## 2024-02-09 NOTE — Patient Instructions (Signed)
 [name] , Thank you for taking time out of your busy schedule to complete your Annual Wellness Visit with me. I enjoyed our conversation and look forward to speaking with you again next year. I, as well as your care team,  appreciate your ongoing commitment to your health goals. Please review the following plan we discussed and let me know if I can assist you in the future. Your Game plan/ To Do List    Referrals:  You have an order for:  []   2D Mammogram  [x]   3D Mammogram  [x]   Bone Density     Please call for appointment:   [facility] [address] -Radiology [place], [place] [address] [phone]   Make sure to wear two-piece clothing.  No lotions, powders, or deodorants the day of the appointment. Make sure to bring picture ID and insurance card.  Bring list of medications you are currently taking including any supplements.   Schedule your [place] screening mammogram through MyChart!   Log into your MyChart account.  Go to 'Visit' (or 'Appointments' if on mobile App) --> Schedule an Appointment  Under 'Select a Reason for Visit' choose the Mammogram Screening option.  Complete the pre-visit questions and select the time and place that best fits your schedule.  Follow up Visits: Next Medicare AWV with our clinical staff: In 1 year    Have you seen your provider in the last 6 months (3 months if uncontrolled diabetes)? Yes Next Office Visit with your provider: To be scheduled   Clinician Recommendations:  Aim for 30 minutes of exercise or brisk walking, 6-8 glasses of water , and 5 servings of fruits and vegetables each day.       This is a list of the screening recommended for you and due dates:  Health Maintenance  Topic Date Due   Zoster (Shingles) Vaccine (1 of 2) Never done   DEXA scan (bone density measurement)  Never done   COVID-19 Vaccine (4 - 2024-25 season) 04/02/2023   Mammogram  05/21/2023   Pneumococcal Vaccine  for age over 89 (1 of 1 - PCV) 09/19/2024*   Flu Shot  03/01/2024   Colon Cancer Screening  09/12/2024   Medicare Annual Wellness Visit  02/08/2025   DTaP/Tdap/Td vaccine (3 - Td or Tdap) 07/06/2030   Hepatitis C Screening  Completed   Hepatitis B Vaccine  Aged Out   HPV Vaccine  Aged Out   Meningitis B Vaccine  Aged Out  *Topic was postponed. The date shown is not the original due date.    Advanced directives: (ACP Link)Information on Advanced Care Planning can be found at [place]  Secretary of [facility] Advance Health Care Directives Advance Health Care Directives. [email or web address]  Advance Care Planning is important because it:  [x]  Makes sure you receive the medical care that is consistent with your values, goals, and preferences  [x]  It provides guidance to your family and loved ones and reduces their decisional burden about whether or not they are making the right decisions based on your wishes.  Follow the link provided in your after visit summary or read over the paperwork we have mailed to you to help you started getting your Advance Directives in place. If you need assistance in completing these, please reach out to us  so that we can help you!  See attachments for Preventive Care and Fall Prevention Tips.
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
## 2024-02-15 ENCOUNTER — Telehealth: Payer: Self-pay | Admitting: Physician Assistant

## 2024-02-15 NOTE — Telephone Encounter (Signed)
 Received a denial for pt assistance from [employer]. Per letter pt was sent a copy as well. ELIQUIS   APP CASE# [id number]  Denial scanned into media

## 2024-02-22 ENCOUNTER — Ambulatory Visit (HOSPITAL_COMMUNITY)
Admission: RE | Admit: 2024-02-22 | Discharge: 2024-02-22 | Disposition: A | Discharge status: Home / Self Care | Source: Ambulatory Visit | Attending: Family Medicine | Admitting: Family Medicine

## 2024-02-22 ENCOUNTER — Encounter: Payer: Self-pay | Admitting: Family Medicine

## 2024-02-22 DIAGNOSIS — Z780 Asymptomatic menopausal state: Secondary | ICD-10-CM | POA: Diagnosis not present

## 2024-02-22 DIAGNOSIS — Z1231 Encounter for screening mammogram for malignant neoplasm of breast: Secondary | ICD-10-CM | POA: Insufficient documentation

## 2024-02-23 ENCOUNTER — Other Ambulatory Visit: Payer: Self-pay

## 2024-02-23 ENCOUNTER — Telehealth: Payer: Self-pay

## 2024-02-23 ENCOUNTER — Ambulatory Visit: Payer: Self-pay | Admitting: Family Medicine

## 2024-02-23 MED ORDER — RIVAROXABAN 20 MG PO TABS: 20.0000 mg | ORAL_TABLET | Freq: Every day | ORAL | 3 refills | Status: DC

## 2024-02-23 NOTE — Telephone Encounter (Signed)
 Call received from patient asking for eliquis  alternative due to cost. Patient states last dose of eliquis  was one week ago and she has been taking a baby aspirin  since then. Randall Hope, NP made aware. Order received for Xarelto 20mg  daily. Prescription sent to pharmacy per NP. Patient made aware.

## 2024-03-04 ENCOUNTER — Encounter: Payer: Self-pay | Admitting: Neurosurgery

## 2024-03-05 ENCOUNTER — Other Ambulatory Visit: Payer: Self-pay | Admitting: Medical Genetics

## 2024-03-22 ENCOUNTER — Other Ambulatory Visit (HOSPITAL_COMMUNITY)
Admission: RE | Admit: 2024-03-22 | Discharge: 2024-03-22 | Disposition: A | Discharge status: Home / Self Care | Payer: Self-pay | Source: Ambulatory Visit | Attending: Medical Genetics | Admitting: Medical Genetics

## 2024-03-31 LAB — GENECONNECT MOLECULAR SCREEN: Genetic Analysis Overall Interpretation: NEGATIVE

## 2024-04-01 ENCOUNTER — Other Ambulatory Visit: Payer: Self-pay | Admitting: Family Medicine

## 2024-04-02 ENCOUNTER — Other Ambulatory Visit: Payer: Self-pay

## 2024-04-02 MED ORDER — AMITRIPTYLINE HCL 25 MG PO TABS: 25.0000 mg | ORAL_TABLET | Freq: Every day | ORAL | 0 refills | Status: DC

## 2024-04-21 ENCOUNTER — Other Ambulatory Visit: Payer: Self-pay | Admitting: Family Medicine

## 2024-05-01 ENCOUNTER — Other Ambulatory Visit: Payer: Self-pay | Admitting: Family Medicine

## 2024-06-27 ENCOUNTER — Other Ambulatory Visit: Payer: Self-pay | Admitting: Family Medicine

## 2024-07-19 ENCOUNTER — Other Ambulatory Visit: Payer: Self-pay | Admitting: Family Medicine

## 2024-08-16 ENCOUNTER — Inpatient Hospital Stay: Attending: Physician Assistant

## 2024-08-16 DIAGNOSIS — Z7901 Long term (current) use of anticoagulants: Secondary | ICD-10-CM

## 2024-08-16 DIAGNOSIS — I82452 Acute embolism and thrombosis of left peroneal vein: Secondary | ICD-10-CM

## 2024-08-16 LAB — COMPREHENSIVE METABOLIC PANEL WITH GFR
ALT: 13 U/L (ref 0–44)
AST: 19 U/L (ref 15–41)
Albumin: 4.4 g/dL (ref 3.5–5.0)
Alkaline Phosphatase: 86 U/L (ref 38–126)
Anion gap: 11 (ref 5–15)
BUN: 12 mg/dL (ref 8–23)
CO2: 27 mmol/L (ref 22–32)
Calcium: 9.1 mg/dL (ref 8.9–10.3)
Chloride: 104 mmol/L (ref 98–111)
Creatinine, Ser: 0.95 mg/dL (ref 0.44–1.00)
GFR, Estimated: >60 mL/min
Glucose, Bld: 90 mg/dL (ref 70–99)
Potassium: 4.2 mmol/L (ref 3.5–5.1)
Sodium: 142 mmol/L (ref 135–145)
Total Bilirubin: 0.4 mg/dL (ref 0.0–1.2)
Total Protein: 7.2 g/dL (ref 6.5–8.1)

## 2024-08-16 LAB — CBC WITH DIFFERENTIAL/PLATELET
Abs Immature Granulocytes: 0.02 K/uL (ref 0.00–0.07)
Basophils Absolute: 0.1 K/uL (ref 0.0–0.1)
Basophils Relative: 1 %
Eosinophils Absolute: 0.2 K/uL (ref 0.0–0.5)
Eosinophils Relative: 3 %
HCT: 43.2 % (ref 36.0–46.0)
Hemoglobin: 13.4 g/dL (ref 12.0–15.0)
Immature Granulocytes: 0 %
Lymphocytes Relative: 32 %
Lymphs Abs: 1.8 K/uL (ref 0.7–4.0)
MCH: 26.1 pg (ref 26.0–34.0)
MCHC: 31 g/dL (ref 30.0–36.0)
MCV: 84.2 fL (ref 80.0–100.0)
Monocytes Absolute: 0.5 K/uL (ref 0.1–1.0)
Monocytes Relative: 8 %
Neutro Abs: 3.1 K/uL (ref 1.7–7.7)
Neutrophils Relative %: 56 %
Platelets: 253 K/uL (ref 150–400)
RBC: 5.13 MIL/uL — ABNORMAL HIGH (ref 3.87–5.11)
RDW: 14 % (ref 11.5–15.5)
WBC: 5.6 K/uL (ref 4.0–10.5)
nRBC: 0 % (ref 0.0–0.2)

## 2024-08-16 LAB — D-DIMER, QUANTITATIVE: D-Dimer, Quant: 0.35 ug{FEU}/mL (ref 0.00–0.50)

## 2024-08-16 NOTE — Progress Notes (Unsigned)
 "  [facility] [address], [place] [address]   CLINIC:  Medical Oncology/Hematology  PCP:  Wendy Glendia LABOR, Wendy Marshall [address] [place] [address] [phone]   REASON FOR VISIT:  Follow-up for recurrent thromboembolism  CURRENT THERAPY: Eliquis  5 mg twice daily  INTERVAL HISTORY:   Wendy Marshall 69 y.o. female returns for routine follow-up of her recurrent thromboembolisms.   She was last evaluated via telemedicine visit by Pleasant Barefoot PA-C on 08/21/2023.  ***Rx for Xarelto  was sent in July 2025, but it looks like she is actually still on Eliquis ??  ***  At today's visit, she reports feeling ***. She has 100***% energy and 100***% appetite. She endorses that she is maintaining a stable weight.  She continues to take Eliquis  twice daily, tolerating well without any signs of major bleeding events.*** ***She has not had any interval DVT or PE since her last visit.    ***She denies any unilateral leg swelling, pain, and erythema. ***No new shortness of breath, dyspnea on exertion, chest pain, cough, and hemoptysis. ***She has occasional palpitations, which is chronic. ***She denies any falls this year  ASSESSMENT & PLAN:  1.  Recurrent thromboembolism: - Bilateral PE on 03/09/2016 after left hip replacement.  Dopplers were negative for DVT.  She was treated with Eliquis  for a year. - Doppler on 11/08/2018 shows occlusive DVT involving left peroneal vein, new compared to Doppler from July 2017.  No extension to more proximal venous system.  This second episode is unprovoked. - Patient has been taking Eliquis  since April 2020.  Tolerating well without any bleeding problems.  *** - She has post thrombotic changes of her left leg, which was about an inch bigger in diameter compared to right leg (per office visit from 1 20-24), likely from prior DVT.  She was encouraged to wear compression socks (15-20 mmHg). - We have reviewed her labs from  08/16/2024: Normal CBC/D, CMP, D-dimer - She does not have any pleuritic chest pains or worsening shortness of breath.*** - She denies any falls this year *** - PLAN: Benefits of indefinite anticoagulation continue to outweigh the risks.  Continue to recommend indefinite anticoagulation.  At this time, since patient is stable on anticoagulation, she may discharge to PCP, who can continue prescription of anticoagulant.  *** - She should return in the future if she has any breakthrough VTE despite compliance with anticoagulation.  *** ***- She does report some financial strain from cost of Eliquis  (co-pay increased to $155 per month this year).  She is currently receiving financial assistance from [employer], but we did discuss that if she requires alternative options in the future she could consider Xarelto , Lovenox , or warfarin.***  2.  Family history: - Mother had brain tumor.  Maternal grandmother had cervical cancer.  Paternal grandmother had colon cancer. - Maternal uncle had unprovoked DVT x2 in his 36s. -- Paternal great-grandmother deceased due to pulmonary embolism in her 58s  PLAN SUMMARY: *** Discharge to PCP?  *** >> Same-day labs (CBC/D, CMP, D-dimer) + OFFICE visit in 1 year     REVIEW OF SYSTEMS: ***  Review of Systems - Oncology   PHYSICAL EXAM:  ECOG PERFORMANCE STATUS: {CHL ONC ECOG ED:[id number]} *** There were no vitals filed for this visit. There were no vitals filed for this visit. Physical Exam  PAST MEDICAL/SURGICAL HISTORY:  Past Medical History:  Diagnosis Date   Anemia    Arthritis    all over  Chondromalacia of knee, right 08/2017   Complication of anesthesia    a.) delayed emergence   Dental crowns present    x 2   Difficulty swallowing pills    since cervical fusion   Family history of adverse reaction to anesthesia    a.) delayed emergence in 1st degree relative (mother)   Fibromyalgia    GERD (gastroesophageal reflux disease)     Headache    x 3 days (08/18/2017)   High cholesterol    History of hiatal hernia    History of pulmonary embolus (PE) 03/09/2016   bilateral   Hyperlipidemia    Hypothyroidism    Limited joint range of motion (ROM)    cervical spine - s/p fusion   Long term current use of anticoagulant    a.) apixaban    Medial meniscus tear 08/2017   right knee   OSA on CPAP    PONV (postoperative nausea and vomiting)    happened years ago-nothing recently   Pulmonary embolism (HCC) 2019   Sleep apnea    a.) requires nocturnal PAP therapy   Past Surgical History:  Procedure Laterality Date   ANTERIOR CERVICAL DECOMP/DISCECTOMY FUSION  07/18/2002   C5-6   ANTERIOR CERVICAL DECOMP/DISCECTOMY FUSION N/A 10/10/2022   Procedure: C4-5 AND C6-7 ANTERIOR CERVICAL DISCECTOMY AND FUSION;  Surgeon: Clois Fret, Wendy Marshall;  Location: [facility];  Service: Neurosurgery;  Laterality: N/A;   CARPAL TUNNEL RELEASE Bilateral 2004   CHONDROPLASTY Right 08/23/2017   Procedure: CHONDROPLASTY;  Surgeon: Liam Lerner, Wendy Marshall;  Location: [facility];  Service: Orthopedics;  Laterality: Right;   EYE MUSCLE SURGERY Bilateral    KNEE ARTHROSCOPY Right 08/23/2017   Procedure: ARTHROSCOPY RIGHT KNEE REMOVAL OF LOOSE BODIES;  Surgeon: Liam Lerner, Wendy Marshall;  Location: [facility];  Service: Orthopedics;  Laterality: Right;   KNEE ARTHROSCOPY WITH MEDIAL MENISECTOMY Right 08/23/2017   Procedure: KNEE ARTHROSCOPY WITH MEDIAL MENISECTOMY;  Surgeon: Liam Lerner, Wendy Marshall;  Location: [facility];  Service: Orthopedics;  Laterality: Right;   KNEE SURGERY Right 03/2019   SHOULDER ARTHROSCOPY Left 06/11/2001   TONSILLECTOMY     age 3   TOTAL HIP ARTHROPLASTY Left 02/01/2016   Procedure: LEFT TOTAL HIP ARTHROPLASTY ANTERIOR APPROACH;  Surgeon: Donnice Car, Wendy Marshall;  Location: [facility];  Service: Orthopedics;  Laterality: Left;  Failed Spinal to LMA   TOTAL HIP ARTHROPLASTY Right 05/2019   TUBAL LIGATION      WISDOM TOOTH EXTRACTION      SOCIAL HISTORY:  Social History   Socioeconomic History   Marital status: Married    Spouse name: Lamar   Number of children: 2   Years of education: Not on file   Highest education level: GED or equivalent  Occupational History   Occupation: Systems Developer    Comment: retired  Tobacco Use   Smoking status: Former    Current packs/day: 0.00    Types: Cigarettes    Quit date: 07/31/1985    Years since quitting: 39.0   Smokeless tobacco: Never  Vaping Use   Vaping status: Never Used  Substance and Sexual Activity   Alcohol use: Not Currently   Drug use: No   Sexual activity: Yes    Birth control/protection: None, Post-menopausal, Surgical    Comment: tubal  Other Topics Concern   Not on file  Social History Narrative   Lives with spouse   Social Drivers of Health   Tobacco Use: Medium Risk (02/09/2024)   Patient History  Smoking Tobacco Use: Former    Smokeless Tobacco Use: Never    Passive Exposure: Not on Actuary Strain: Low Risk (02/02/2024)   Overall Financial Resource Strain (CARDIA)    Difficulty of Paying Living Expenses: Not very hard  Food Insecurity: No Food Insecurity (02/02/2024)   Epic    Worried About Programme Researcher, Broadcasting/film/video in the Last Year: Never true    Ran Out of Food in the Last Year: Never true  Transportation Needs: No Transportation Needs (02/02/2024)   Epic    Lack of Transportation (Medical): No    Lack of Transportation (Non-Medical): No  Physical Activity: Sufficiently Active (02/02/2024)   Exercise Vital Sign    Days of Exercise per Week: 5 days    Minutes of Exercise per Session: 30 min  Stress: Stress Concern Present (02/02/2024)   [employer] of Occupational Health - Occupational Stress Questionnaire    Feeling of Stress: To some extent  Social Connections: Socially Integrated (02/02/2024)   Social Connection and Isolation Panel    Frequency of Communication with Friends and Family: More than  three times a week    Frequency of Social Gatherings with Friends and Family: Three times a week    Attends Religious Services: More than 4 times per year    Active Member of Clubs or Organizations: Yes    Attends Banker Meetings: 1 to 4 times per year    Marital Status: Married  Catering Manager Violence: Not At Risk (02/09/2024)   Epic    Fear of Current or Ex-Partner: No    Emotionally Abused: No    Physically Abused: No    Sexually Abused: No  Depression (PHQ2-9): Medium Risk (02/09/2024)   Depression (PHQ2-9)    PHQ-2 Score: 10  Alcohol Screen: Low Risk (02/02/2024)   Alcohol Screen    Last Alcohol Screening Score (AUDIT): 0  Housing: Low Risk (02/02/2024)   Epic    Unable to Pay for Housing in the Last Year: No    Number of Times Moved in the Last Year: 0    Homeless in the Last Year: No  Utilities: Not At Risk (02/09/2024)   Epic    Threatened with loss of utilities: No  Health Literacy: Adequate Health Literacy (02/09/2024)   B1300 Health Literacy    Frequency of need for help with medical instructions: Never    FAMILY HISTORY:  Family History  Problem Relation Age of Onset   Hypertension Mother    Anesthesia problems Mother        hard to wake up post-op   Brain cancer Father    Colon cancer Other    Heart attack Other    Throat cancer Other    Colon cancer Paternal Grandmother    Heart attack Maternal Grandfather    Throat cancer Maternal Grandfather     CURRENT MEDICATIONS:  Outpatient Encounter Medications as of 08/20/2024  Medication Sig   amitriptyline  (ELAVIL ) 25 MG tablet TAKE 1 TABLET BY MOUTH AT BEDTIME   apixaban  (ELIQUIS ) 5 MG TABS tablet Take 1 tablet (5 mg total) by mouth 2 (two) times daily.   HYDROcodone -acetaminophen  (NORCO) 10-325 MG tablet Take 1/2 - 1 tab po every 4 hours prn severe pain; max 2 tabs per 24 hours (Patient not taking: Reported on 02/09/2024)   levothyroxine  (SYNTHROID ) 112 MCG tablet TAKE 1/2 TABLET BY MOUTH ON MONDAY  AND FRIDAY AND 1 TABLET ON ALL OTHER DAYS   Multiple Vitamin (MULTIVITAMIN) tablet  Take 1 tablet by mouth daily.   rivaroxaban  (XARELTO ) 20 MG TABS tablet Take 1 tablet (20 mg total) by mouth daily with supper.   rosuvastatin  (CRESTOR ) 10 MG tablet Take 1 tablet by mouth once daily   No facility-administered encounter medications on file as of 08/20/2024.    ALLERGIES:  Allergies[1]  LABORATORY DATA:  I have reviewed the labs as listed.  CBC    Component Value Date/Time   WBC 5.6 08/16/2024 0855   RBC 5.13 (H) 08/16/2024 0855   HGB 13.4 08/16/2024 0855   HGB 13.9 07/12/2021 1650   HCT 43.2 08/16/2024 0855   HCT 42.4 07/12/2021 1650   PLT 253 08/16/2024 0855   PLT 365 07/12/2021 1650   MCV 84.2 08/16/2024 0855   MCV 80 07/12/2021 1650   MCH 26.1 08/16/2024 0855   MCHC 31.0 08/16/2024 0855   RDW 14.0 08/16/2024 0855   RDW 13.3 07/12/2021 1650   LYMPHSABS 1.8 08/16/2024 0855   LYMPHSABS 1.6 07/12/2021 1650   MONOABS 0.5 08/16/2024 0855   EOSABS 0.2 08/16/2024 0855   EOSABS 0.0 07/12/2021 1650   BASOSABS 0.1 08/16/2024 0855   BASOSABS 0.0 07/12/2021 1650      Latest Ref Rng & Units 08/16/2024    8:55 AM 09/16/2023    1:15 PM 08/14/2023   12:57 PM  CMP  Glucose 70 - 99 mg/dL 90  895  871   BUN 8 - 23 mg/dL 12  15  12    Creatinine 0.44 - 1.00 mg/dL 9.04  8.86  9.08   Sodium 135 - 145 mmol/L 142  139  137   Potassium 3.5 - 5.1 mmol/L 4.2  3.6  4.0   Chloride 98 - 111 mmol/L 104  105  107   CO2 22 - 32 mmol/L 27  22  24    Calcium  8.9 - 10.3 mg/dL 9.1  9.1  8.8   Total Protein 6.5 - 8.1 g/dL 7.2  7.8  7.0   Total Bilirubin 0.0 - 1.2 mg/dL 0.4  0.9  0.6   Alkaline Phos 38 - 126 U/L 86  65  65   AST 15 - 41 U/L 19  26  17    ALT 0 - 44 U/L 13  16  13      DIAGNOSTIC IMAGING:  I have independently reviewed the relevant imaging and discussed with the patient.   WRAP UP:  All questions were answered. The patient knows to call the clinic with any problems, questions or  concerns.  Medical decision making: ***  Time spent on visit: I spent *** minutes counseling the patient face to face. The total time spent in the appointment was *** minutes and more than 50% was on counseling.  Pleasant CHRISTELLA Barefoot, PA-C  ***    [1]  Allergies Allergen Reactions   Penicillins Hives, Shortness Of Breath and Other (See Comments)   Adhesive [Tape] Other (See Comments)    SKIN TEARS-ok to use paper tape   Contrast Media [Iodinated Contrast Media] Hives    With CT contrast dye   Flexeril [Cyclobenzaprine] Other (See Comments)    AFFECTED HEART RATE   Omnipaque  [Iohexol ] Hives   Soma [Carisoprodol] Other (See Comments)    AFFECTED HEART RATE   Metaxalone Other (See Comments)   Methocarbamol  Other (See Comments)   Nsaids Other (See Comments)    Can't take due to being on Eliquis    Other Other (See Comments)    Muscle relaxer- heart problems  Tizanidine Other (See Comments)   "

## 2024-08-20 ENCOUNTER — Inpatient Hospital Stay: Payer: Medicare HMO | Admitting: Physician Assistant

## 2024-08-20 ENCOUNTER — Inpatient Hospital Stay: Payer: Medicare HMO

## 2024-08-20 VITALS — BP 147/62 | HR 105 | Temp 99.4°F | Resp 19 | Ht 63.0 in | Wt 234.0 lb

## 2024-08-20 DIAGNOSIS — I82452 Acute embolism and thrombosis of left peroneal vein: Secondary | ICD-10-CM

## 2024-08-20 DIAGNOSIS — Z7901 Long term (current) use of anticoagulants: Secondary | ICD-10-CM

## 2024-08-20 MED ORDER — APIXABAN 5 MG PO TABS: 5.0000 mg | ORAL_TABLET | Freq: Two times a day (BID) | ORAL | 11 refills | Status: AC

## 2024-08-20 NOTE — Patient Instructions (Signed)
 [facility] **VISIT SUMMARY & IMPORTANT INSTRUCTIONS **   You were seen today by Pleasant Barefoot PA-C for your history of blood clots.   Due to high risk of recurrent blood clot if you were to stop taking blood thinner, I recommend that you continue taking Eliquis  5 mg twice daily. You will need to remain on blood thinner indefinitely (lifelong).  At this time, you are stable to be discharged to your primary care provider.  Your primary care doctor can continue prescribing blood thinner.  However, you should return to our office in the future if you have any new blood clot despite taking blood thinner as prescribed.  FOLLOW-UP APPOINTMENT: No follow-up visits needed at this time   ** Thank you for trusting me with your healthcare!  I strive to provide all of my patients with quality care at each visit.  If you receive a survey for this visit, I would be so grateful to you for taking the time to provide feedback.  Thank you in advance!  ~ Manuelita Moxon                                        Dr. Mickiel Davonna Pleasant Barefoot, PA-C          Delon Hope, NP   - - - - - - - - - - - - - - - - - -     Thank you for choosing [facility] at [facility] to provide your oncology and hematology care.  To afford each patient quality time with our provider, please arrive at least 15 minutes before your scheduled appointment time.   If you have a lab appointment with the Cancer Center please come in thru the Main Entrance and check in at the main information desk.  You need to re-schedule your appointment should you arrive 10 or more minutes late.  We strive to give you quality time with our providers, and arriving late affects you and other patients whose appointments are after yours.  Also, if you no show three or more times for appointments you may be dismissed from the clinic at the providers discretion.     Again, thank you for  choosing [facility].  Our hope is that these requests will decrease the amount of time that you wait before being seen by our physicians.       _____________________________________________________________  Should you have questions after your visit to [facility], please contact our office at [phone] and follow the prompts.  Our office hours are 8:00 a.m. and 4:30 p.m. Monday - Friday.  Please note that voicemails left after 4:00 p.m. may not be returned until the following business day.  We are closed weekends and major holidays.  You do have access to a nurse 24-7, just call the main number to the clinic [phone] and do not press any options, hold on the line and a nurse will answer the phone.    For prescription refill requests, have your pharmacy contact our office and allow 72 hours.

## 2025-02-14 ENCOUNTER — Ambulatory Visit
